# Patient Record
Sex: Male | Born: 1960 | Race: White | Hispanic: No | Marital: Married | Smoking: Never smoker
Health system: Southern US, Community
[De-identification: ages and names within clinical notes are randomized; demographics above are authoritative.]

## PROBLEM LIST (undated history)

## (undated) DIAGNOSIS — K219 Gastro-esophageal reflux disease without esophagitis: Secondary | ICD-10-CM

## (undated) DIAGNOSIS — I219 Acute myocardial infarction, unspecified: Secondary | ICD-10-CM

## (undated) DIAGNOSIS — E785 Hyperlipidemia, unspecified: Secondary | ICD-10-CM

## (undated) DIAGNOSIS — Z950 Presence of cardiac pacemaker: Secondary | ICD-10-CM

## (undated) DIAGNOSIS — Z87442 Personal history of urinary calculi: Secondary | ICD-10-CM

## (undated) DIAGNOSIS — D099 Carcinoma in situ, unspecified: Secondary | ICD-10-CM

## (undated) DIAGNOSIS — N183 Chronic kidney disease, stage 3 unspecified: Secondary | ICD-10-CM

## (undated) DIAGNOSIS — C4492 Squamous cell carcinoma of skin, unspecified: Secondary | ICD-10-CM

## (undated) DIAGNOSIS — Z95810 Presence of automatic (implantable) cardiac defibrillator: Secondary | ICD-10-CM

## (undated) DIAGNOSIS — I2510 Atherosclerotic heart disease of native coronary artery without angina pectoris: Secondary | ICD-10-CM

## (undated) DIAGNOSIS — C4491 Basal cell carcinoma of skin, unspecified: Secondary | ICD-10-CM

## (undated) DIAGNOSIS — C801 Malignant (primary) neoplasm, unspecified: Secondary | ICD-10-CM

## (undated) DIAGNOSIS — I5022 Chronic systolic (congestive) heart failure: Secondary | ICD-10-CM

## (undated) DIAGNOSIS — E119 Type 2 diabetes mellitus without complications: Secondary | ICD-10-CM

## (undated) HISTORY — PX: HERNIA REPAIR: SHX51

## (undated) HISTORY — DX: Hyperlipidemia, unspecified: E78.5

## (undated) HISTORY — DX: Chronic kidney disease, stage 3 unspecified: N18.30

## (undated) HISTORY — DX: Basal cell carcinoma of skin, unspecified: C44.91

## (undated) HISTORY — DX: Squamous cell carcinoma of skin, unspecified: C44.92

## (undated) HISTORY — PX: CARDIAC CATHETERIZATION: SHX172

## (undated) HISTORY — DX: Chronic kidney disease, stage 3 (moderate): N18.3

## (undated) HISTORY — DX: Carcinoma in situ, unspecified: D09.9

## (undated) HISTORY — DX: Type 2 diabetes mellitus without complications: E11.9

## (undated) HISTORY — DX: Presence of automatic (implantable) cardiac defibrillator: Z95.810

## (undated) HISTORY — DX: Atherosclerotic heart disease of native coronary artery without angina pectoris: I25.10

## (undated) HISTORY — DX: Gastro-esophageal reflux disease without esophagitis: K21.9

## (undated) HISTORY — DX: Chronic systolic (congestive) heart failure: I50.22

## (undated) HISTORY — PX: INSERT / REPLACE / REMOVE PACEMAKER: SUR710

---

## 2006-06-07 DIAGNOSIS — C4491 Basal cell carcinoma of skin, unspecified: Secondary | ICD-10-CM

## 2006-06-07 HISTORY — DX: Basal cell carcinoma of skin, unspecified: C44.91

## 2008-01-24 DIAGNOSIS — D099 Carcinoma in situ, unspecified: Secondary | ICD-10-CM

## 2008-01-24 HISTORY — DX: Carcinoma in situ, unspecified: D09.9

## 2009-05-03 ENCOUNTER — Ambulatory Visit: Payer: Self-pay | Admitting: Internal Medicine

## 2009-05-03 ENCOUNTER — Ambulatory Visit: Payer: Self-pay | Admitting: Pulmonary Disease

## 2009-05-03 ENCOUNTER — Inpatient Hospital Stay (HOSPITAL_COMMUNITY): Admission: EM | Admit: 2009-05-03 | Discharge: 2009-05-12 | Payer: Self-pay | Admitting: Cardiology

## 2009-05-03 DIAGNOSIS — I219 Acute myocardial infarction, unspecified: Secondary | ICD-10-CM

## 2009-05-03 HISTORY — DX: Acute myocardial infarction, unspecified: I21.9

## 2009-05-05 ENCOUNTER — Encounter: Payer: Self-pay | Admitting: Cardiology

## 2009-09-04 ENCOUNTER — Ambulatory Visit: Payer: Self-pay | Admitting: Cardiology

## 2009-12-04 ENCOUNTER — Ambulatory Visit: Payer: Self-pay | Admitting: Cardiology

## 2010-01-18 ENCOUNTER — Encounter: Payer: Self-pay | Admitting: Cardiology

## 2010-02-03 ENCOUNTER — Other Ambulatory Visit: Payer: Self-pay

## 2010-02-09 ENCOUNTER — Telehealth: Payer: Self-pay | Admitting: *Deleted

## 2010-02-09 NOTE — Telephone Encounter (Signed)
See chart for note dictated.

## 2010-03-23 LAB — COMPREHENSIVE METABOLIC PANEL
ALT: 147 U/L — ABNORMAL HIGH (ref 0–53)
ALT: 157 U/L — ABNORMAL HIGH (ref 0–53)
AST: 461 U/L — ABNORMAL HIGH (ref 0–37)
AST: 483 U/L — ABNORMAL HIGH (ref 0–37)
AST: 63 U/L — ABNORMAL HIGH (ref 0–37)
Albumin: 2.7 g/dL — ABNORMAL LOW (ref 3.5–5.2)
Albumin: 2.9 g/dL — ABNORMAL LOW (ref 3.5–5.2)
Albumin: 3.6 g/dL (ref 3.5–5.2)
Alkaline Phosphatase: 60 U/L (ref 39–117)
Alkaline Phosphatase: 83 U/L (ref 39–117)
BUN: 18 mg/dL (ref 6–23)
BUN: 20 mg/dL (ref 6–23)
CO2: 22 mEq/L (ref 19–32)
Calcium: 7.8 mg/dL — ABNORMAL LOW (ref 8.4–10.5)
Calcium: 8.2 mg/dL — ABNORMAL LOW (ref 8.4–10.5)
Creatinine, Ser: 1.09 mg/dL (ref 0.4–1.5)
GFR calc Af Amer: >60 mL/min (ref >60)
GFR calc Af Amer: >60 mL/min (ref >60)
GFR calc Af Amer: >60 mL/min (ref >60)
Potassium: 3.4 mEq/L — ABNORMAL LOW (ref 3.5–5.1)
Sodium: 136 mEq/L (ref 135–145)
Sodium: 138 mEq/L (ref 135–145)
Total Bilirubin: 0.9 mg/dL (ref 0.3–1.2)
Total Protein: 5.8 g/dL — ABNORMAL LOW (ref 6.0–8.3)
Total Protein: 6.8 g/dL (ref 6.0–8.3)

## 2010-03-23 LAB — HEPARIN LEVEL (UNFRACTIONATED)
Heparin Unfractionated: 0.15 IU/mL — ABNORMAL LOW (ref 0.30–0.70)
Heparin Unfractionated: 0.25 IU/mL — ABNORMAL LOW (ref 0.30–0.70)
Heparin Unfractionated: 0.31 IU/mL (ref 0.30–0.70)

## 2010-03-23 LAB — GLUCOSE, CAPILLARY: Glucose-Capillary: 121 mg/dL — ABNORMAL HIGH (ref 70–99)

## 2010-03-23 LAB — BRAIN NATRIURETIC PEPTIDE: Pro B Natriuretic peptide (BNP): 376 pg/mL — ABNORMAL HIGH (ref 0.0–100.0)

## 2010-03-23 LAB — DIFFERENTIAL
Basophils Absolute: 0 10*3/uL (ref 0.0–0.1)
Basophils Absolute: 0.1 10*3/uL (ref 0.0–0.1)
Basophils Absolute: 0.1 10*3/uL (ref 0.0–0.1)
Basophils Absolute: 0.1 10*3/uL (ref 0.0–0.1)
Basophils Relative: 0 % (ref 0–1)
Basophils Relative: 0 % (ref 0–1)
Basophils Relative: 0 % (ref 0–1)
Basophils Relative: 0 % (ref 0–1)
Basophils Relative: 1 % (ref 0–1)
Basophils Relative: 1 % (ref 0–1)
Basophils Relative: 1 % (ref 0–1)
Eosinophils Absolute: 0 10*3/uL (ref 0.0–0.7)
Eosinophils Absolute: 0.1 10*3/uL (ref 0.0–0.7)
Eosinophils Absolute: 0.2 10*3/uL (ref 0.0–0.7)
Eosinophils Absolute: 0.3 10*3/uL (ref 0.0–0.7)
Eosinophils Absolute: 0.4 10*3/uL (ref 0.0–0.7)
Eosinophils Relative: 0 % (ref 0–5)
Eosinophils Relative: 0 % (ref 0–5)
Eosinophils Relative: 1 % (ref 0–5)
Eosinophils Relative: 2 % (ref 0–5)
Lymphocytes Relative: 18 % (ref 12–46)
Lymphocytes Relative: 25 % (ref 12–46)
Lymphocytes Relative: 7 % — ABNORMAL LOW (ref 12–46)
Lymphs Abs: 1.7 10*3/uL (ref 0.7–4.0)
Lymphs Abs: 2.2 10*3/uL (ref 0.7–4.0)
Lymphs Abs: 2.4 10*3/uL (ref 0.7–4.0)
Monocytes Absolute: 0.7 10*3/uL (ref 0.1–1.0)
Monocytes Absolute: 0.8 10*3/uL (ref 0.1–1.0)
Monocytes Absolute: 0.9 10*3/uL (ref 0.1–1.0)
Monocytes Absolute: 1 10*3/uL (ref 0.1–1.0)
Monocytes Absolute: 1 10*3/uL (ref 0.1–1.0)
Monocytes Absolute: 1 10*3/uL (ref 0.1–1.0)
Monocytes Absolute: 1 10*3/uL (ref 0.1–1.0)
Monocytes Absolute: 1.3 10*3/uL — ABNORMAL HIGH (ref 0.1–1.0)
Monocytes Absolute: 1.4 10*3/uL — ABNORMAL HIGH (ref 0.1–1.0)
Monocytes Relative: 10 % (ref 3–12)
Monocytes Relative: 11 % (ref 3–12)
Monocytes Relative: 8 % (ref 3–12)
Monocytes Relative: 8 % (ref 3–12)
Monocytes Relative: 8 % (ref 3–12)
Monocytes Relative: 9 % (ref 3–12)
Neutro Abs: 12.5 10*3/uL — ABNORMAL HIGH (ref 1.7–7.7)
Neutro Abs: 13.6 10*3/uL — ABNORMAL HIGH (ref 1.7–7.7)
Neutro Abs: 5.1 10*3/uL (ref 1.7–7.7)
Neutro Abs: 6.6 10*3/uL (ref 1.7–7.7)
Neutro Abs: 9.2 10*3/uL — ABNORMAL HIGH (ref 1.7–7.7)
Neutrophils Relative %: 57 % (ref 43–77)
Neutrophils Relative %: 62 % (ref 43–77)
Neutrophils Relative %: 69 % (ref 43–77)
Neutrophils Relative %: 73 % (ref 43–77)
Neutrophils Relative %: 77 % (ref 43–77)
Neutrophils Relative %: 86 % — ABNORMAL HIGH (ref 43–77)

## 2010-03-23 LAB — POCT I-STAT 3, ART BLOOD GAS (G3+)
O2 Saturation: 90 %
O2 Saturation: 95 %
Patient temperature: 98.7
TCO2: 26 mmol/L (ref 0–100)
TCO2: 26 mmol/L (ref 0–100)
TCO2: 28 mmol/L (ref 0–100)
pCO2 arterial: 33.8 mmHg — ABNORMAL LOW (ref 35.0–45.0)
pCO2 arterial: 36.3 mmHg (ref 35.0–45.0)
pH, Arterial: 7.448 (ref 7.350–7.450)
pH, Arterial: 7.511 — ABNORMAL HIGH (ref 7.350–7.450)
pO2, Arterial: 53 mmHg — ABNORMAL LOW (ref 80.0–100.0)

## 2010-03-23 LAB — BASIC METABOLIC PANEL
BUN: 15 mg/dL (ref 6–23)
CO2: 23 mEq/L (ref 19–32)
CO2: 23 mEq/L (ref 19–32)
CO2: 25 mEq/L (ref 19–32)
CO2: 25 mEq/L (ref 19–32)
CO2: 26 mEq/L (ref 19–32)
CO2: 27 mEq/L (ref 19–32)
CO2: 27 mEq/L (ref 19–32)
CO2: 28 mEq/L (ref 19–32)
Calcium: 7.9 mg/dL — ABNORMAL LOW (ref 8.4–10.5)
Calcium: 8 mg/dL — ABNORMAL LOW (ref 8.4–10.5)
Calcium: 8.7 mg/dL (ref 8.4–10.5)
Chloride: 101 mEq/L (ref 96–112)
Chloride: 102 mEq/L (ref 96–112)
Chloride: 103 mEq/L (ref 96–112)
Chloride: 104 mEq/L (ref 96–112)
Chloride: 104 mEq/L (ref 96–112)
Creatinine, Ser: 1.08 mg/dL (ref 0.4–1.5)
Creatinine, Ser: 1.16 mg/dL (ref 0.4–1.5)
Creatinine, Ser: 1.16 mg/dL (ref 0.4–1.5)
Creatinine, Ser: 1.16 mg/dL (ref 0.4–1.5)
Creatinine, Ser: 1.2 mg/dL (ref 0.4–1.5)
Creatinine, Ser: 1.21 mg/dL (ref 0.4–1.5)
GFR calc Af Amer: 55 mL/min — ABNORMAL LOW (ref >60)
GFR calc Af Amer: >60 mL/min (ref >60)
GFR calc Af Amer: >60 mL/min (ref >60)
GFR calc Af Amer: >60 mL/min (ref >60)
GFR calc Af Amer: >60 mL/min (ref >60)
Glucose, Bld: 110 mg/dL — ABNORMAL HIGH (ref 70–99)
Glucose, Bld: 115 mg/dL — ABNORMAL HIGH (ref 70–99)
Glucose, Bld: 132 mg/dL — ABNORMAL HIGH (ref 70–99)
Glucose, Bld: 142 mg/dL — ABNORMAL HIGH (ref 70–99)
Glucose, Bld: 163 mg/dL — ABNORMAL HIGH (ref 70–99)
Potassium: 3.9 mEq/L (ref 3.5–5.1)
Sodium: 133 mEq/L — ABNORMAL LOW (ref 135–145)
Sodium: 135 mEq/L (ref 135–145)
Sodium: 138 mEq/L (ref 135–145)
Sodium: 138 mEq/L (ref 135–145)
Sodium: 138 mEq/L (ref 135–145)

## 2010-03-23 LAB — TROPONIN I
Troponin I: 1.54 ng/mL (ref 0.00–0.06)
Troponin I: >100 ng/mL (ref 0.00–0.06)
Troponin I: >100 ng/mL (ref 0.00–0.06)

## 2010-03-23 LAB — CBC
HCT: 40.2 % (ref 39.0–52.0)
HCT: 41.2 % (ref 39.0–52.0)
HCT: 43.8 % (ref 39.0–52.0)
HCT: 51.5 % (ref 39.0–52.0)
Hemoglobin: 13.3 g/dL (ref 13.0–17.0)
Hemoglobin: 13.8 g/dL (ref 13.0–17.0)
Hemoglobin: 13.8 g/dL (ref 13.0–17.0)
Hemoglobin: 14 g/dL (ref 13.0–17.0)
Hemoglobin: 14 g/dL (ref 13.0–17.0)
Hemoglobin: 15.4 g/dL (ref 13.0–17.0)
MCHC: 34.3 g/dL (ref 30.0–36.0)
MCHC: 34.3 g/dL (ref 30.0–36.0)
MCHC: 34.7 g/dL (ref 30.0–36.0)
MCHC: 35 g/dL (ref 30.0–36.0)
MCHC: 35 g/dL (ref 30.0–36.0)
MCHC: 35.1 g/dL (ref 30.0–36.0)
MCHC: 35.1 g/dL (ref 30.0–36.0)
MCV: 90.2 fL (ref 78.0–100.0)
MCV: 90.7 fL (ref 78.0–100.0)
MCV: 90.9 fL (ref 78.0–100.0)
MCV: 91 fL (ref 78.0–100.0)
MCV: 91.8 fL (ref 78.0–100.0)
Platelets: 156 10*3/uL (ref 150–400)
Platelets: 160 10*3/uL (ref 150–400)
Platelets: 235 10*3/uL (ref 150–400)
Platelets: 242 10*3/uL (ref 150–400)
RBC: 4.18 MIL/uL — ABNORMAL LOW (ref 4.22–5.81)
RBC: 4.38 MIL/uL (ref 4.22–5.81)
RBC: 4.41 MIL/uL (ref 4.22–5.81)
RBC: 4.44 MIL/uL (ref 4.22–5.81)
RDW: 12.8 % (ref 11.5–15.5)
RDW: 12.8 % (ref 11.5–15.5)
RDW: 13.2 % (ref 11.5–15.5)
RDW: 13.3 % (ref 11.5–15.5)
RDW: 13.4 % (ref 11.5–15.5)
RDW: 13.7 % (ref 11.5–15.5)
WBC: 9 10*3/uL (ref 4.0–10.5)
WBC: 9.4 10*3/uL (ref 4.0–10.5)
WBC: 9.7 10*3/uL (ref 4.0–10.5)

## 2010-03-23 LAB — CK TOTAL AND CKMB (NOT AT ARMC)
CK, MB: 306.7 ng/mL (ref 0.3–4.0)
Relative Index: 6.6 — ABNORMAL HIGH (ref 0.0–2.5)
Relative Index: 9.1 — ABNORMAL HIGH (ref 0.0–2.5)
Total CK: 4637 U/L — ABNORMAL HIGH (ref 7–232)
Total CK: 6388 U/L — ABNORMAL HIGH (ref 7–232)

## 2010-03-23 LAB — HEPATIC FUNCTION PANEL
AST: 89 U/L — ABNORMAL HIGH (ref 0–37)
Bilirubin, Direct: 0.3 mg/dL (ref 0.0–0.3)

## 2010-03-23 LAB — MRSA PCR SCREENING: MRSA by PCR: NEGATIVE

## 2010-03-23 LAB — LIPID PANEL: VLDL: 46 mg/dL — ABNORMAL HIGH (ref 0–40)

## 2010-03-23 LAB — HEMOGLOBIN A1C
Mean Plasma Glucose: 126 mg/dL — ABNORMAL HIGH (ref <117)
Mean Plasma Glucose: 128 mg/dL — ABNORMAL HIGH (ref <117)

## 2010-03-23 LAB — MAGNESIUM: Magnesium: 2.3 mg/dL (ref 1.5–2.5)

## 2010-03-28 ENCOUNTER — Encounter: Payer: Self-pay | Admitting: Cardiology

## 2010-03-29 ENCOUNTER — Ambulatory Visit: Payer: BC Managed Care – PPO | Admitting: Cardiology

## 2010-03-29 ENCOUNTER — Inpatient Hospital Stay (HOSPITAL_COMMUNITY)
Admission: AD | Admit: 2010-03-29 | Discharge: 2010-04-02 | DRG: 127 | Disposition: A | Discharge status: Home / Self Care | Payer: BC Managed Care – PPO | Source: Other Acute Inpatient Hospital | Attending: Cardiology | Admitting: Cardiology

## 2010-03-29 DIAGNOSIS — I5023 Acute on chronic systolic (congestive) heart failure: Principal | ICD-10-CM | POA: Diagnosis present

## 2010-03-29 DIAGNOSIS — I959 Hypotension, unspecified: Secondary | ICD-10-CM | POA: Diagnosis present

## 2010-03-29 DIAGNOSIS — K811 Chronic cholecystitis: Secondary | ICD-10-CM | POA: Diagnosis present

## 2010-03-29 DIAGNOSIS — I252 Old myocardial infarction: Secondary | ICD-10-CM

## 2010-03-29 DIAGNOSIS — N183 Chronic kidney disease, stage 3 unspecified: Secondary | ICD-10-CM | POA: Diagnosis present

## 2010-03-29 DIAGNOSIS — I2589 Other forms of chronic ischemic heart disease: Secondary | ICD-10-CM | POA: Diagnosis present

## 2010-03-29 DIAGNOSIS — I2510 Atherosclerotic heart disease of native coronary artery without angina pectoris: Secondary | ICD-10-CM

## 2010-03-29 DIAGNOSIS — I509 Heart failure, unspecified: Secondary | ICD-10-CM | POA: Diagnosis present

## 2010-03-29 DIAGNOSIS — E785 Hyperlipidemia, unspecified: Secondary | ICD-10-CM | POA: Diagnosis present

## 2010-03-29 DIAGNOSIS — K219 Gastro-esophageal reflux disease without esophagitis: Secondary | ICD-10-CM | POA: Diagnosis present

## 2010-03-29 DIAGNOSIS — E119 Type 2 diabetes mellitus without complications: Secondary | ICD-10-CM | POA: Diagnosis present

## 2010-03-29 LAB — CARDIAC PANEL(CRET KIN+CKTOT+MB+TROPI)
CK, MB: 1.6 ng/mL (ref 0.3–4.0)
Relative Index: 0.6 (ref 0.0–2.5)
Total CK: 263 U/L — ABNORMAL HIGH (ref 7–232)
Troponin I: 0.03 ng/mL (ref 0.00–0.06)

## 2010-03-30 DIAGNOSIS — I509 Heart failure, unspecified: Secondary | ICD-10-CM

## 2010-03-30 LAB — COMPREHENSIVE METABOLIC PANEL
ALT: 25 U/L (ref 0–53)
AST: 34 U/L (ref 0–37)
Alkaline Phosphatase: 111 U/L (ref 39–117)
CO2: 27 mEq/L (ref 19–32)
GFR calc Af Amer: 58 mL/min — ABNORMAL LOW (ref >60)
GFR calc non Af Amer: 48 mL/min — ABNORMAL LOW (ref >60)
Glucose, Bld: 174 mg/dL — ABNORMAL HIGH (ref 70–99)
Potassium: 3.7 mEq/L (ref 3.5–5.1)
Sodium: 139 mEq/L (ref 135–145)
Total Protein: 6.4 g/dL (ref 6.0–8.3)

## 2010-03-30 LAB — LIPID PANEL
Cholesterol: 96 mg/dL (ref 0–200)
Total CHOL/HDL Ratio: 3.2 RATIO

## 2010-03-30 LAB — BRAIN NATRIURETIC PEPTIDE: Pro B Natriuretic peptide (BNP): 807 pg/mL — ABNORMAL HIGH (ref 0.0–100.0)

## 2010-03-30 LAB — PROTIME-INR
INR: 1.34 (ref 0.00–1.49)
Prothrombin Time: 16.8 seconds — ABNORMAL HIGH (ref 11.6–15.2)

## 2010-03-30 LAB — CBC
MCHC: 33.7 g/dL (ref 30.0–36.0)
RDW: 16.3 % — ABNORMAL HIGH (ref 11.5–15.5)

## 2010-03-30 LAB — HEMOCCULT GUIAC POC 1CARD (OFFICE): Fecal Occult Bld: NEGATIVE

## 2010-03-31 ENCOUNTER — Inpatient Hospital Stay (HOSPITAL_COMMUNITY): Payer: BC Managed Care – PPO

## 2010-03-31 LAB — BASIC METABOLIC PANEL
CO2: 32 mEq/L (ref 19–32)
Calcium: 9.1 mg/dL (ref 8.4–10.5)
Creatinine, Ser: 1.56 mg/dL — ABNORMAL HIGH (ref 0.4–1.5)
GFR calc Af Amer: 58 mL/min — ABNORMAL LOW (ref >60)
Sodium: 137 mEq/L (ref 135–145)

## 2010-03-31 LAB — POCT I-STAT 3, VENOUS BLOOD GAS (G3P V)
Bicarbonate: 28.6 mEq/L — ABNORMAL HIGH (ref 20.0–24.0)
O2 Saturation: 65 %
TCO2: 30 mmol/L (ref 0–100)
pCO2, Ven: 42.7 mmHg — ABNORMAL LOW (ref 45.0–50.0)
pCO2, Ven: 44.1 mmHg — ABNORMAL LOW (ref 45.0–50.0)
pH, Ven: 7.406 — ABNORMAL HIGH (ref 7.250–7.300)
pH, Ven: 7.421 — ABNORMAL HIGH (ref 7.250–7.300)
pO2, Ven: 32 mmHg (ref 30.0–45.0)
pO2, Ven: 33 mmHg (ref 30.0–45.0)

## 2010-04-01 LAB — HEPATIC FUNCTION PANEL
AST: 30 U/L (ref 0–37)
Bilirubin, Direct: 0.7 mg/dL — ABNORMAL HIGH (ref 0.0–0.3)
Indirect Bilirubin: 1.4 mg/dL — ABNORMAL HIGH (ref 0.3–0.9)
Total Bilirubin: 2.1 mg/dL — ABNORMAL HIGH (ref 0.3–1.2)

## 2010-04-01 LAB — BASIC METABOLIC PANEL
BUN: 14 mg/dL (ref 6–23)
CO2: 30 mEq/L (ref 19–32)
Calcium: 9.3 mg/dL (ref 8.4–10.5)
Creatinine, Ser: 1.51 mg/dL — ABNORMAL HIGH (ref 0.4–1.5)
Glucose, Bld: 125 mg/dL — ABNORMAL HIGH (ref 70–99)
Sodium: 140 mEq/L (ref 135–145)

## 2010-04-01 LAB — BRAIN NATRIURETIC PEPTIDE: Pro B Natriuretic peptide (BNP): 348 pg/mL — ABNORMAL HIGH (ref 0.0–100.0)

## 2010-04-02 DIAGNOSIS — I5023 Acute on chronic systolic (congestive) heart failure: Secondary | ICD-10-CM

## 2010-04-02 LAB — BASIC METABOLIC PANEL
BUN: 11 mg/dL (ref 6–23)
Calcium: 9.5 mg/dL (ref 8.4–10.5)
GFR calc non Af Amer: >60 mL/min (ref >60)
Glucose, Bld: 101 mg/dL — ABNORMAL HIGH (ref 70–99)
Sodium: 137 mEq/L (ref 135–145)

## 2010-04-05 NOTE — Procedures (Signed)
  NAMEBUTLER, VEGH              ACCOUNT NO.:  [id number]  MEDICAL RECORD NO.:  [id number]           PATIENT TYPE:  I  LOCATION:  2001                         FACILITY:  [facility]  PHYSICIAN:  Baby Stairs. Excell Seltzer, MD  DATE OF BIRTH:  [birth year]  DATE OF PROCEDURE:  03/30/2010 DATE OF DISCHARGE:                           CARDIAC CATHETERIZATION   PROCEDURE:  Right heart catheterization.  PROCEDURAL INDICATION:  Mr. Viets is a 50 year old gentleman with a history of MI.  He has an ejection fraction of about 40% at baseline, but on echo from [facility] yesterday, his ejection fraction was down to 10%.  There were concerns about low output heart failure as he had some abdominal discomfort early this morning.  He was referred for right heart cath for hemodynamic assessment.  Risks and indications of procedure were reviewed with the patient. Informed consent was obtained.  The right groin was prepped, draped, and anesthetized with 1% lidocaine.  Using modified Seldinger technique, a 7- French sheath was placed in the right femoral vein and a Swan-Ganz catheter was advanced into the pulmonary artery.  Right heart pressures were recorded throughout all of the right heart chambers.  Fick and thermodilution cardiac outputs were both calculated.  The patient tolerated the procedure well.  There were no immediate complications.  PROCEDURAL FINDINGS:  Right atrial pressure mean of 4, right ventricular pressure 33/3, pulmonary artery pressure 40/17 with a mean of 25, pulmonary capillary wedge pressure mean of 12.  Oxygen saturations:  Aorta is 92% by pulse oximetry, pulmonary artery is 65%.  Fick cardiac output calculated to 4.8 liters per minute.  Cardiac index was 2.4 liters per minute per meter squared.  Thermodilution cardiac output was 4.5 liters per minute.  Cardiac index was 2.2 liters per minute per meter squared.  FINAL IMPRESSION:  Essentially normal hemodynamics with  preserved cardiac output.     Veverly Fells. Excell Seltzer, MD     MDC/MEDQ  D:  03/30/2010  T:  03/31/2010  Job:  [id number]  cc:   Bevelyn Buckles. Bensimhon, MD Learta Codding, MD,FACC  Electronically Signed by Tonny Bollman MD on 04/05/2010 01:14:41 PM

## 2010-04-06 NOTE — Consult Note (Signed)
Summary: CARDIOLOGY CONSULT/ [facility]  CARDIOLOGY CONSULT/ [facility]   Imported By: Zachary George 03/30/2010 09:00:06  _____________________________________________________________________  External Attachment:    Type:   Image     Comment:   External Document

## 2010-04-07 ENCOUNTER — Other Ambulatory Visit: Payer: BC Managed Care – PPO | Admitting: *Deleted

## 2010-04-09 ENCOUNTER — Encounter: Payer: Self-pay | Admitting: Internal Medicine

## 2010-04-12 ENCOUNTER — Encounter: Payer: Self-pay | Admitting: Nurse Practitioner

## 2010-04-13 ENCOUNTER — Encounter: Payer: Self-pay | Admitting: Nurse Practitioner

## 2010-04-15 ENCOUNTER — Ambulatory Visit (INDEPENDENT_AMBULATORY_CARE_PROVIDER_SITE_OTHER): Payer: BC Managed Care – PPO | Admitting: Internal Medicine

## 2010-04-15 ENCOUNTER — Encounter: Payer: Self-pay | Admitting: Internal Medicine

## 2010-04-15 VITALS — BP 115/82 | HR 86 | Resp 14 | Ht 72.0 in | Wt 177.0 lb

## 2010-04-15 DIAGNOSIS — I2510 Atherosclerotic heart disease of native coronary artery without angina pectoris: Secondary | ICD-10-CM

## 2010-04-15 DIAGNOSIS — I5022 Chronic systolic (congestive) heart failure: Secondary | ICD-10-CM | POA: Insufficient documentation

## 2010-04-15 MED ORDER — CANDESARTAN CILEXETIL 4 MG PO TABS: 4.0000 mg | ORAL_TABLET | Freq: Two times a day (BID) | ORAL | Status: DC

## 2010-04-15 MED ORDER — HYDRALAZINE HCL 25 MG PO TABS: ORAL_TABLET | ORAL | Status: DC

## 2010-04-15 NOTE — Progress Notes (Signed)
HPI:  Jerl is a 50 y/o [employer] with h/o CAD s/p anterior MI in May 2011 treated with DES. Hospitalization c/b ARDS. EF on d/c 40-45%. Has h/o angioedema on ACE-I.   Admitted in 3/12 with acute/chronic CHF. EF down to 10-15%. 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 Diuresed and meds titrated.  Right heart cardiac catheterization performed on March 30, 2010, showing essentially normal hemodynamics and preserved cardiac output and RA of 4, RV 33/1 (3), PA 40/17 (25), PCWP 12.  Cardiac output (Fick) 4.83 liters per minute with an index of 2.42 liters per minute per meter squared. D/c'd home with Life Vest.  Returns for f/u. Feels better than he has in a year. Very active. No CP or dyspnea. Weighing every and is stable. Wife is a Teacher, early years/pre and following sliding scale lasix.   Unable to tolerate Imdur due to headaches. Had to cut hydralazine back to 25 bid due to orthostasis. LifeVest gave him a shock warning but downloaded data and was due to static electricity from taking shirt off.    ROS: All systems negative except as listed in HPI, PMH and Problem List.  Past Medical History  Diagnosis Date  . HLD (hyperlipidemia)   . GERD (gastroesophageal reflux disease)   . Cardiomyopathy   . CAD (coronary artery disease)   . Chronic kidney disease, stage III (moderate)     Current Outpatient Prescriptions  Medication Sig Dispense Refill  . aspirin 81 MG tablet Take 81 mg by mouth daily.        . candesartan (ATACAND) 4 MG tablet Take 4 mg by mouth daily.        . carvedilol (COREG) 3.125 MG tablet Take 3.125 mg by mouth 2 (two) times daily with a meal.        . clopidogrel (PLAVIX) 75 MG tablet Take 75 mg by mouth daily.        . fexofenadine (ALLEGRA) 180 MG tablet Take 180 mg by mouth as needed.       . furosemide (LASIX) 20 MG tablet Take 20 mg by mouth daily. 1 tab daily.......Marland Kitchenevery third day pt takes 40mg  due to swelling      . hydrALAZINE (APRESOLINE) 25 MG tablet Take  25 mg by mouth 2 (two) times daily.       . ondansetron (ZOFRAN) 8 MG tablet Take 8 mg by mouth every 8 (eight) hours as needed.        . rosuvastatin (CRESTOR) 40 MG tablet Take 40 mg by mouth daily.        Marland Kitchen spironolactone (ALDACTONE) 25 MG tablet Take 12.5 mg by mouth daily.        Marland Kitchen DISCONTD: isosorbide mononitrate (IMDUR) 30 MG 24 hr tablet Take 30 mg by mouth daily.           PHYSICAL EXAM: Filed Vitals:   04/15/10 1529  BP: 115/82  Pulse: 86  Resp: 14   General:  Well appearing. No resp difficulty HEENT: normal Neck: supple. JVP flat. Carotids 2+ bilaterally; no bruits. No lymphadenopathy or thryomegaly appreciated. Cor: PMI normal. Regular rate & rhythm. No rubs, gallops or murmurs. Lungs: clear Abdomen: soft, nontender, nondistended. No hepatosplenomegaly. No bruits or masses. Good bowel sounds. Extremities: no cyanosis, clubbing, rash, edema Neuro: alert & orientedx3, cranial nerves grossly intact. Moves all 4 extremities w/o difficulty. Affect pleasant.    ECG:   ASSESSMENT & PLAN:

## 2010-04-15 NOTE — Patient Instructions (Addendum)
Decrease Hydralazine to 25mg  1/2 tab Twice daily  Increase Candesartan to 4mg  Twice daily  Follow up in 2 weeks

## 2010-04-15 NOTE — Assessment & Plan Note (Signed)
Much improved. NYHA II. Volume status and labs look good. Increase candesartan to 4 bid. Cut hydralazine to 12.5 bid. See back in 2 weeks. Plan to titrate coreg at next visit. Will probably do quick look echo at next visit. Continue LifeVest for now.

## 2010-04-15 NOTE — Assessment & Plan Note (Signed)
No evidence of ischemia. Continue current regimen.   

## 2010-04-29 ENCOUNTER — Ambulatory Visit (INDEPENDENT_AMBULATORY_CARE_PROVIDER_SITE_OTHER): Payer: BC Managed Care – PPO | Admitting: Internal Medicine

## 2010-04-29 ENCOUNTER — Encounter: Payer: Self-pay | Admitting: Internal Medicine

## 2010-04-29 VITALS — BP 117/78 | HR 65 | Ht 72.0 in | Wt 183.0 lb

## 2010-04-29 DIAGNOSIS — I5022 Chronic systolic (congestive) heart failure: Secondary | ICD-10-CM

## 2010-04-29 MED ORDER — CARVEDILOL 6.25 MG PO TABS: 6.2500 mg | ORAL_TABLET | Freq: Two times a day (BID) | ORAL | Status: DC

## 2010-04-29 NOTE — Patient Instructions (Addendum)
Increase Carvedilol to 6.25mg  Twice daily  Stop Hydralazine Your physician recommends that you schedule a follow-up appointment in: 1 month

## 2010-04-29 NOTE — Assessment & Plan Note (Signed)
Doing very well. NYHA I. Volume status looks good. However, I checked echo myself today and EF remains ~15% range. Will titrate carvedilol to 6.25 bid. Given low BPs will stop hydralazine. Continue LifeVest.

## 2010-04-29 NOTE — Progress Notes (Signed)
HPI:  Derrick Lane is a 50 y/o state [employer] with h/o CAD s/p anterior MI in May 2011 treated with DES. Hospitalization c/b ARDS. EF on d/c 40-45%. Has h/o angioedema on ACE-I.   Admitted in 3/12 with acute/chronic CHF. EF down to 10-15%. 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 Diuresed and meds titrated.  Right heart cardiac catheterization performed on March 30, 2010, showing essentially normal hemodynamics and preserved cardiac output and RA of 4, RV 33/1 (3), PA 40/17 (25), PCWP 12.  Cardiac output (Fick) 4.83 liters per minute with an index of 2.42 liters per minute per meter squared. D/c'd home with Life Vest.  At last visit cut hydralazine back and increased candesartan to 4 bid.  Returns for f/u. Doing great. Very active. No CP or dyspnea. Appetite back and eating a lot. Weighing every day and has been gaining slowly. No edema, orthopnea or PND. Occasionally dizzy when he stands. SBP 95-105. Had one reading of 87.    ROS: All systems negative except as listed in HPI, PMH and Problem List.  Past Medical History  Diagnosis Date  . HLD (hyperlipidemia)   . GERD (gastroesophageal reflux disease)   . Chronic systolic heart failure     EF 16-10%.   Marland Kitchen CAD (coronary artery disease)     s/p Anterior MI 5/11. LAD stent  . Chronic kidney disease, stage III (moderate)     Current Outpatient Prescriptions  Medication Sig Dispense Refill  . aspirin 81 MG tablet Take 81 mg by mouth daily.        . candesartan (ATACAND) 4 MG tablet Take 1 tablet (4 mg total) by mouth 2 (two) times daily.  60 tablet  6  . carvedilol (COREG) 3.125 MG tablet Take 3.125 mg by mouth 2 (two) times daily with a meal.        . clopidogrel (PLAVIX) 75 MG tablet Take 75 mg by mouth daily.        . fexofenadine (ALLEGRA) 180 MG tablet Take 180 mg by mouth as needed.       . furosemide (LASIX) 20 MG tablet Take 20 mg by mouth daily. 1 tab daily.......Marland Kitchenevery third day pt takes 40mg  due to swelling      .  hydrALAZINE (APRESOLINE) 25 MG tablet 1/2 tab Twice daily  30 tablet  6  . ondansetron (ZOFRAN) 8 MG tablet Take 8 mg by mouth every 8 (eight) hours as needed.        . rosuvastatin (CRESTOR) 40 MG tablet Take 40 mg by mouth daily.        Marland Kitchen spironolactone (ALDACTONE) 25 MG tablet Take 12.5 mg by mouth daily.           PHYSICAL EXAM: Filed Vitals:   04/29/10 1554  BP: 117/78  Pulse: 80   General:  Well appearing. No resp difficulty HEENT: normal Neck: supple. JVP flat. Carotids 2+ bilaterally; no bruits. No lymphadenopathy or thryomegaly appreciated. Cor: PMI normal. Regular rate & rhythm. No rubs, gallops or murmurs. Lungs: clear Abdomen: soft, nontender, nondistended. No hepatosplenomegaly. No bruits or masses. Good bowel sounds. Extremities: no cyanosis, clubbing, rash, edema Neuro: alert & orientedx3, cranial nerves grossly intact. Moves all 4 extremities w/o difficulty. Affect pleasant.    ECG: NSR 65 septal infarct. No ST-T wave abnormalities.     ASSESSMENT & PLAN:

## 2010-05-03 ENCOUNTER — Telehealth: Payer: Self-pay | Admitting: *Deleted

## 2010-05-03 NOTE — Telephone Encounter (Signed)
Completed prior auth paperwork for pt's Atacand last week, he was intolerant of Diovan in the hospital so was placed on Atacand, med was approved 04/30/10

## 2010-05-04 NOTE — Discharge Summary (Signed)
Derrick Lane, Derrick Lane              ACCOUNT NO.:  [id number]  MEDICAL RECORD NO.:  [id number]           PATIENT TYPE:  I  LOCATION:  2001                         FACILITY:  [facility]  PHYSICIAN:  Bevelyn Buckles. Aleia Larocca, MDDATE OF BIRTH:  10-31-1960  DATE OF ADMISSION:  03/29/2010 DATE OF DISCHARGE:  04/02/2010                              DISCHARGE SUMMARY   PRIMARY CARDIOLOGIST:  Bevelyn Buckles. Kiptyn Rafuse, MD  DISCHARGE DIAGNOSIS:  Acute-on-chronic systolic congestive heart failure.  SECONDARY DIAGNOSES: 1. Mixed nonischemic and ischemic cardiomyopathy, ejection fraction of     10%. 2. Coronary artery disease, status post anterior myocardial infarction     with stenting of the left anterior descending x2 in May 2011. 3. Hyperlipidemia. 4. Gastroesophageal reflux disease. 5. Borderline diabetes mellitus. 6. Stage III chronic kidney disease. 7. Intermittent abdominal discomfort with gallbladder wall thickening     and polyps without stones noted on ultrasound this admission. 8. 2.2-cm simple cyst noted on the tail of the pancreas and spleen.  ALLERGIES:  RAMIPRIL caused angioedema.  PROCEDURES: 1. Abdominal ultrasound on March 31, 2010, showing abnormal     gallbladder with wall thickening and polyps.  No definite stones.     There was a 2.2-cm simple cyst noted in the region of the tail of     the pancreas and spleen.  No other acute or significant findings. 2. Right heart cardiac catheterization performed on May 30, 2010,     showing essentially normal hemodynamics and preserved cardiac     output and RA of 4, RV 33/1 (3), PA 40/17 (25), PCWP 12.  Cardiac     output (Fick) 4.83 liters per minute with an index of 2.42 liters     per minute per meter squared.  HISTORY OF PRESENT ILLNESS:  This is a 50 year old male with prior history of coronary artery disease status post anterior MI in May 2011 with LAD stenting x2 at that time.  This hospital course was complicated by cardiogenic  shock and pulmonary edema requiring intraaortic balloon pump.  Two-D echocardiogram in May 2011 showed an EF of 40-45%.  Since the MI, the patient had some degree of persistent nausea with occasional vomiting which has been progressively worsening since January 2012 and now associated with early satiety and weight loss.  He was seen by his primary care provider on March 18, 2010, with gallbladder ultrasound showing thickening of the wall with sludge in the gallbladder and has been scheduled to see Surgery to discuss cholecystectomy.  In the meantime, however, he developed significant anorexia with weakness and fatigue and presented to [facility] on March [facility], 2010.  There, he had a CT scan of abdomen showing right greater than left pleural effusions and ascites of undetermined etiology as well as right lower lobe pulmonary opacity which was felt to represent atelectasis versus pneumonia.  The patient was also felt to be significant volume overloaded and Cardiology was consulted.  A repeat 2-D echocardiogram was undertaken in the setting of fatigue, ascites, and anorexia and this showed an EF of less than 20% with moderate to severe LV dilatation and restrictive diastolic filling.  There was scarring and thinning of the basal anterior, mid anteroseptal, mid anterior, apical anterior, and apical lateral walls.  This was a significant reduction in LV function since his last echo following his MI.  The patient was transferred to the [facility] for further evaluation.  HOSPITAL COURSE:  Following transfer, the patient still had some volume overload.  He was maintained on IV Lasix for an additional day.  His beta-blocker was converted to carvedilol.  We could not use ACE inhibitor secondary to prior history of angioedema.  Right heart cardiac catheterization was undertaken on March 30, 2010, showing relatively normal filling pressures with cardiac output of 4.8 and cardiac index of 2.4  as outlined above.  The patient's Lasix was converted to p.o. and he was also placed on low-dose spirolactone therapy.  He has been challenged with Atacand (an ARB) and has tolerated this well without any evidence of angioedema.  As outlined in the HPI, the patient has been dealing with nausea and abdominal discomfort as well as early satiety.  Gallbladder ultrasound was repeated here on March 31, 2010, showing wall thickening and polyps. As previously mentioned, the patient has outpatient arrangements to discuss these findings with Surgery.  General Surgery was curbside consulted here and with recommendation for outpatient followup if abdominal discomfort returns or worsens.  Finally, in the setting of significant cardiomyopathy, we have arranged for a LifeVest placement which was carried out on April 01, 2010.  The patient will continue to wear LifeVest and we will plan for followup echocardiogram on maximal medical therapy in approximately 8-12 weeks with plan for ICD if EF remains poor.  The patient will otherwise be discharged home today in good condition.  DISCHARGE LABORATORY DATA:  Weight is 75 kg, down from 79.039 kg on admission.  The patient had a negative of approximately 6.7 liters during this admission.  Hemoglobin 15.0, hematocrit 44.5, WBC 9.6, and platelets 161.  INR 1.34.  Sodium 137, potassium 4.0, chloride 101, CO2 of 25, BUN 11, creatinine 0.12, and glucose 101.  Total bilirubin 2.1, direct bilirubin 0.7, indirect bilirubin 1.4, alkaline phosphatase 121, AST 30, ALT 24, total protein 6.5, albumin 3.4, and calcium 9.5.  CK 249, MB 1.3, and troponin-I 0.02.  BNP was 348.  Total cholesterol 96, triglycerides 79, HDL 30, and LDL 50.  Fecal occult blood was negative.  DISPOSITION:  The patient will be discharged home today in good condition.  FOLLOWUP PLANS AND APPOINTMENTS:  The patient will have followup basic metabolic panel and BNP on April 09, 2010, in our  [place] office.  He will follow up with Dr. Arvilla Meres on April 15, 2010, at 2:45 p.m.  DISCHARGE MEDICATIONS: 1. Candesartan 4 mg daily. 2. Carvedilol 3.125 mg b.i.d. 3. Hydralazine 25 mg t.i.d. 4. Imdur 30 mg nightly. 5. Spirolactone 25 mg 1/2 a tablet daily. 6. Allegra 180 mg daily. 7. Aspirin 81 mg daily. 8. Crestor 40 mg daily. 9. Furosemide 20 mg daily. 10.Plavix 75 mg daily. 11.Zofran 8 mg q.6 h. P.r.n.  OUTSTANDING LABORATORY DATA AND STUDIES:  Followup BMET and BNP in 1 week.  DURATION OF DISCHARGE ENCOUNTER:  60 minutes including physician time.     Nicolasa Ducking, ANP   ______________________________ Bevelyn Buckles. Kema Santaella, MD    CB/MEDQ  D:  04/02/2010  T:  04/03/2010  Job:  [id number]  Electronically Signed by Nicolasa Ducking ANP on 04/13/2010 27:25:36 PM Electronically Signed by Arvilla Meres MD on 05/04/2010 07:10:25 PM

## 2010-05-12 ENCOUNTER — Other Ambulatory Visit: Payer: Self-pay | Admitting: *Deleted

## 2010-05-12 MED ORDER — ROSUVASTATIN CALCIUM 40 MG PO TABS: 40.0000 mg | ORAL_TABLET | Freq: Every day | ORAL | Status: DC

## 2010-06-03 ENCOUNTER — Encounter: Payer: Self-pay | Admitting: *Deleted

## 2010-06-03 ENCOUNTER — Ambulatory Visit (INDEPENDENT_AMBULATORY_CARE_PROVIDER_SITE_OTHER): Payer: BC Managed Care – PPO | Admitting: Internal Medicine

## 2010-06-03 ENCOUNTER — Encounter: Payer: Self-pay | Admitting: Internal Medicine

## 2010-06-03 VITALS — BP 118/80 | HR 57 | Ht 72.0 in | Wt 193.0 lb

## 2010-06-03 DIAGNOSIS — I2510 Atherosclerotic heart disease of native coronary artery without angina pectoris: Secondary | ICD-10-CM

## 2010-06-03 DIAGNOSIS — I5022 Chronic systolic (congestive) heart failure: Secondary | ICD-10-CM

## 2010-06-03 LAB — BASIC METABOLIC PANEL
Calcium: 9.2 mg/dL (ref 8.4–10.5)
Chloride: 103 mEq/L (ref 96–112)
Creatinine, Ser: 1.1 mg/dL (ref 0.4–1.5)
Sodium: 140 mEq/L (ref 135–145)

## 2010-06-03 LAB — BRAIN NATRIURETIC PEPTIDE: Pro B Natriuretic peptide (BNP): 221 pg/mL — ABNORMAL HIGH (ref 0.0–100.0)

## 2010-06-03 MED ORDER — CARVEDILOL 6.25 MG PO TABS: 9.3750 mg | ORAL_TABLET | Freq: Two times a day (BID) | ORAL | Status: DC

## 2010-06-03 NOTE — Assessment & Plan Note (Signed)
NYHA I. Volume status looks good. BP coming up is a good sign. Will increase coreg to 9.375 bid. Due for labs today.

## 2010-06-03 NOTE — Assessment & Plan Note (Signed)
No evidence of ischemia. Continue current regimen.   

## 2010-06-03 NOTE — Patient Instructions (Signed)
Increase Carvedilol to 1 & 1/2 tabs Twice daily  Labs today (bmet, bnp) Your physician has requested that you have an echocardiogram. Echocardiography is a painless test that uses sound waves to create images of your heart. It provides your doctor with information about the size and shape of your heart and how well your heart's chambers and valves are working. This procedure takes approximately one hour. There are no restrictions for this procedure.  IN 3 WEEKS, SAME DAY AS APPT. WITH MD Your physician recommends that you schedule a follow-up appointment in: 3 weeks

## 2010-06-03 NOTE — Progress Notes (Signed)
HPI:  Derrick Lane is a 50 y/o [employer] with h/o CAD s/p anterior MI in May 2011 treated with DES. Hospitalization c/b ARDS. EF on d/c 40-45%. Has h/o angioedema on ACE-I.   Admitted in 3/12 with acute/chronic CHF. EF down to 10-15%. 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 Diuresed and meds titrated.  Right heart cardiac catheterization performed on March 30, 2010, showing essentially normal hemodynamics and preserved cardiac output and RA of 4, RV 33/1 (3), PA 40/17 (25), PCWP 12.  Cardiac output (Fick) 4.83 liters per minute with an index of 2.42 liters per minute per meter squared. D/c'd home with Life Vest.  At last visit stopped hydralazine due to low BP. Quick look echo last visit by me EF ~20%.  Returns for f/u. Doing great. Very active. No CP or dyspnea. Appetite back and eating a lot. Weighing every day and has been gaining slowly - now about 20 pounds. No edema, orthopnea or PND. No dizzy. SBP 100-125. Compliant with meds. Wearing LifeVest.   ROS: All systems negative except as listed in HPI, PMH and Problem List.  Past Medical History  Diagnosis Date  . HLD (hyperlipidemia)   . GERD (gastroesophageal reflux disease)   . Chronic systolic heart failure     EF 95-62%.   Marland Kitchen CAD (coronary artery disease)     s/p Anterior MI 5/11. LAD stent  . Chronic kidney disease, stage III (moderate)     Current Outpatient Prescriptions  Medication Sig Dispense Refill  . aspirin 81 MG tablet Take 81 mg by mouth daily.        . candesartan (ATACAND) 4 MG tablet Take 1 tablet (4 mg total) by mouth 2 (two) times daily.  60 tablet  6  . carvedilol (COREG) 6.25 MG tablet Take 1 tablet (6.25 mg total) by mouth 2 (two) times daily with a meal.  60 tablet  6  . clopidogrel (PLAVIX) 75 MG tablet Take 75 mg by mouth daily.        . fexofenadine (ALLEGRA) 180 MG tablet Take 180 mg by mouth as needed.       . furosemide (LASIX) 20 MG tablet Take 20 mg by mouth daily. 1 tab daily.......Marland Kitchenevery  third day pt takes 40mg  due to swelling      . hydrALAZINE (APRESOLINE) 25 MG tablet       . ondansetron (ZOFRAN) 8 MG tablet Take 8 mg by mouth every 8 (eight) hours as needed.        . rosuvastatin (CRESTOR) 40 MG tablet Take 1 tablet (40 mg total) by mouth daily.  30 tablet  6  . spironolactone (ALDACTONE) 25 MG tablet Take 12.5 mg by mouth daily.        Marland Kitchen DISCONTD: PLAVIX 75 MG tablet          PHYSICAL EXAM: Filed Vitals:   06/03/10 1347  BP: 118/80  Pulse: 57   General:  Well appearing. No resp difficulty HEENT: normal Neck: supple. JVP flat. Carotids 2+ bilaterally; no bruits. No lymphadenopathy or thryomegaly appreciated. Cor: PMI normal. Wide split s3. Regular rate & rhythm. No rubs, murmurs. Soft s3. Lungs: clear Abdomen: soft, nontender, nondistended. No hepatosplenomegaly. No bruits or masses. Good bowel sounds. Extremities: no cyanosis, clubbing, rash, edema Neuro: alert & orientedx3, cranial nerves grossly intact. Moves all 4 extremities w/o difficulty. Affect pleasant.    ECG: NSR 65 septal infarct. No ST-T wave abnormalities.     ASSESSMENT & PLAN:

## 2010-06-04 ENCOUNTER — Other Ambulatory Visit: Payer: Self-pay | Admitting: *Deleted

## 2010-06-04 MED ORDER — CLOPIDOGREL BISULFATE 75 MG PO TABS: 75.0000 mg | ORAL_TABLET | Freq: Every day | ORAL | Status: DC

## 2010-06-09 ENCOUNTER — Encounter: Payer: Self-pay | Admitting: *Deleted

## 2010-06-14 ENCOUNTER — Ambulatory Visit: Payer: BC Managed Care – PPO | Admitting: Internal Medicine

## 2010-06-21 ENCOUNTER — Ambulatory Visit (INDEPENDENT_AMBULATORY_CARE_PROVIDER_SITE_OTHER): Payer: BC Managed Care – PPO | Admitting: Internal Medicine

## 2010-06-21 ENCOUNTER — Encounter: Payer: Self-pay | Admitting: Internal Medicine

## 2010-06-21 ENCOUNTER — Ambulatory Visit (HOSPITAL_COMMUNITY): Payer: BC Managed Care – PPO | Attending: Internal Medicine

## 2010-06-21 VITALS — BP 114/78 | HR 68 | Ht 72.0 in | Wt 194.0 lb

## 2010-06-21 DIAGNOSIS — I2510 Atherosclerotic heart disease of native coronary artery without angina pectoris: Secondary | ICD-10-CM

## 2010-06-21 DIAGNOSIS — I5022 Chronic systolic (congestive) heart failure: Secondary | ICD-10-CM

## 2010-06-21 DIAGNOSIS — I079 Rheumatic tricuspid valve disease, unspecified: Secondary | ICD-10-CM | POA: Insufficient documentation

## 2010-06-21 DIAGNOSIS — I059 Rheumatic mitral valve disease, unspecified: Secondary | ICD-10-CM | POA: Insufficient documentation

## 2010-06-21 MED ORDER — CARVEDILOL 12.5 MG PO TABS: 12.5000 mg | ORAL_TABLET | Freq: Two times a day (BID) | ORAL | Status: DC

## 2010-06-21 NOTE — Patient Instructions (Signed)
Increase Carvedilol to 12.5 mg Twice daily   Your physician has recommended that you have a cardiopulmonary stress test (CPX). CPX testing is a non-invasive measurement of heart and lung function. It replaces a traditional treadmill stress test. This type of test provides a tremendous amount of information that relates not only to your present condition but also for future outcomes. This test combines measurements of you ventilation, respiratory gas exchange in the lungs, electrocardiogram (EKG), blood pressure and physical response before, during, and following an exercise protocol.  You have been referred to Dr Graciela Husbands for ? defib  Your physician recommends that you schedule a follow-up appointment in: 1 month with Dr Gala Romney

## 2010-06-21 NOTE — Progress Notes (Signed)
HPI:  Derrick Lane is a 50 y/o [employer] with h/o CAD s/p anterior MI in May 2011 treated with DES. Hospitalization c/b ARDS. EF on d/c 40-45%. Has h/o angioedema on ACE-I.   Admitted in 3/12 with acute/chronic CHF. EF down to 10-15%. 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 Diuresed and meds titrated.  Right heart cardiac catheterization performed on March 30, 2010, showing essentially normal hemodynamics and preserved cardiac output and RA of 4, RV 33/1 (3), PA 40/17 (25), PCWP 12.  Cardiac output (Fick) 4.83 liters per minute with an index of 2.42 liters per minute per meter squared. D/c'd home with Life Vest.  At last visit stopped hydralazine due to low BP.   Returns for f/u. Doing great. Very active. No CP or dyspnea. Appetite back and eating a lot. Weighing every day and weight stable. No edema, orthopnea or PND. No dizzy. SBP 100-125. Compliant with meds. Wearing LifeVest.   Echo today (which I reviewed): Mildly dilated LV EF 10-15%. RV mild to moderately HK.   ROS: All systems negative except as listed in HPI, PMH and Problem List.  Past Medical History  Diagnosis Date  . HLD (hyperlipidemia)   . GERD (gastroesophageal reflux disease)   . Chronic systolic heart failure     EF 16-10%.   Marland Kitchen CAD (coronary artery disease)     s/p Anterior MI 5/11. LAD stent  . Chronic kidney disease, stage III (moderate)     Current Outpatient Prescriptions  Medication Sig Dispense Refill  . aspirin 81 MG tablet Take 81 mg by mouth daily.        . candesartan (ATACAND) 4 MG tablet Take 1 tablet (4 mg total) by mouth 2 (two) times daily.  60 tablet  6  . carvedilol (COREG) 6.25 MG tablet Take 1.5 tablets (9.375 mg total) by mouth 2 (two) times daily with a meal.  90 tablet  3  . clopidogrel (PLAVIX) 75 MG tablet Take 1 tablet (75 mg total) by mouth daily.  30 tablet  11  . fexofenadine (ALLEGRA) 180 MG tablet Take 180 mg by mouth as needed.       . furosemide (LASIX) 20 MG tablet Take  20 mg by mouth daily. 1 tab daily.......Marland Kitchenevery third day pt takes 40mg  due to swelling      . hydrALAZINE (APRESOLINE) 25 MG tablet       . ondansetron (ZOFRAN) 8 MG tablet Take 8 mg by mouth every 8 (eight) hours as needed.        . rosuvastatin (CRESTOR) 40 MG tablet Take 1 tablet (40 mg total) by mouth daily.  30 tablet  6  . spironolactone (ALDACTONE) 25 MG tablet Take 12.5 mg by mouth daily.           PHYSICAL EXAM: Filed Vitals:   06/21/10 1020  BP: 114/78  Pulse: 68   Vitals - 1 value per visit 04/15/2010 04/29/2010 06/03/2010 06/21/2010  Weight (lb) 177 183 193 194  HEIGHT 6\' 0"  6\' 0"  6\' 0"  6\' 0"    General:  Well appearing. No resp difficulty HEENT: normal Neck: supple. JVP flat. Carotids 2+ bilaterally; no bruits. No lymphadenopathy or thryomegaly appreciated. Cor: PMI normal. Wide split s3. Regular rate & rhythm. No rubs, murmurs. Soft s3. Lungs: clear Abdomen: soft, nontender, nondistended. No hepatosplenomegaly. No bruits or masses. Good bowel sounds. Extremities: no cyanosis, clubbing, rash, edema Neuro: alert & orientedx3, cranial nerves grossly intact. Moves all 4 extremities w/o difficulty. Affect pleasant.  ASSESSMENT & PLAN:

## 2010-06-21 NOTE — Assessment & Plan Note (Signed)
No evidence of ischemia. Continue current regimen.   

## 2010-06-21 NOTE — Assessment & Plan Note (Addendum)
Doing well. NYHA I. Volume status looks good. Unfortunately EF has not had any significant recovery despite titration of meds as much as BP would tolerate over past few months. Will refer to DR. Graciela Husbands for ICD. Check CPX test. See back in 1 mont and try to titrate atacand to 8 bid.

## 2010-07-08 ENCOUNTER — Ambulatory Visit (HOSPITAL_COMMUNITY): Payer: BC Managed Care – PPO | Attending: Internal Medicine

## 2010-07-08 DIAGNOSIS — R0602 Shortness of breath: Secondary | ICD-10-CM | POA: Insufficient documentation

## 2010-07-08 DIAGNOSIS — I2589 Other forms of chronic ischemic heart disease: Secondary | ICD-10-CM

## 2010-07-08 DIAGNOSIS — I5022 Chronic systolic (congestive) heart failure: Secondary | ICD-10-CM

## 2010-07-13 ENCOUNTER — Encounter: Payer: Self-pay | Admitting: Internal Medicine

## 2010-07-13 ENCOUNTER — Ambulatory Visit (INDEPENDENT_AMBULATORY_CARE_PROVIDER_SITE_OTHER): Payer: BC Managed Care – PPO | Admitting: Internal Medicine

## 2010-07-13 ENCOUNTER — Encounter: Payer: Self-pay | Admitting: *Deleted

## 2010-07-13 DIAGNOSIS — I498 Other specified cardiac arrhythmias: Secondary | ICD-10-CM

## 2010-07-13 DIAGNOSIS — I429 Cardiomyopathy, unspecified: Secondary | ICD-10-CM | POA: Insufficient documentation

## 2010-07-13 DIAGNOSIS — R001 Bradycardia, unspecified: Secondary | ICD-10-CM | POA: Insufficient documentation

## 2010-07-13 NOTE — Assessment & Plan Note (Signed)
We would anticipate a dual-chamber device given his resting bradycardia. This would allow further up titration of his carvedilol.

## 2010-07-13 NOTE — Progress Notes (Signed)
HPI: Derrick Lane is a 50 y.o. male seen at the request of Dr. Dorthea Cove for consideration of ICD implantation.  His history of coronary artery disease with prior anterior MI in May 2011 treated with DES. Hospitalization c/b ARDS. EF on d/c 40-45%. March 2012 he developed acute congestive heart failure. Reassessment of his ejection fraction demonstrated that it was 10-15%. 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 Reassessment of his ejection fraction 2 weeks ago remained 10-15%.   Right heart cardiac catheterization performed on March 30, 2010, showing essentially normal hemodynamics and preserved cardiac output and RA of 4, RV 33/1 (3), PA 40/17 (25), PCWP 12. Cardiac output (Fick) 4.83 liters per minute with an index of 2.42 liters per minute per meter squared. He was subsequently discharged wearing a light fast. Notwithstanding a problem with ACE inhibitor he is tolerated atacand  He has mild exercise intolerance. He has not had nocturnal dyspnea orthopnea peripheral edema or syncope.    Current Outpatient Prescriptions  Medication Sig Dispense Refill  . aspirin 81 MG tablet Take 81 mg by mouth daily.        . candesartan (ATACAND) 4 MG tablet Take 1 tablet (4 mg total) by mouth 2 (two) times daily.  60 tablet  6  . carvedilol (COREG) 12.5 MG tablet Take 1 tablet (12.5 mg total) by mouth 2 (two) times daily with a meal.  60 tablet  6  . clopidogrel (PLAVIX) 75 MG tablet Take 1 tablet (75 mg total) by mouth daily.  30 tablet  11  . fexofenadine (ALLEGRA) 180 MG tablet Take 180 mg by mouth as needed.       . furosemide (LASIX) 20 MG tablet Take 20 mg by mouth daily. 1 tab daily.......Marland Kitchenevery third day pt takes 40mg  due to swelling      . hydrALAZINE (APRESOLINE) 25 MG tablet Take 25 mg by mouth 2 (two) times daily.       . rosuvastatin (CRESTOR) 40 MG tablet Take 1 tablet (40 mg total) by mouth daily.  30  tablet  6  . spironolactone (ALDACTONE) 25 MG tablet Take 12.5 mg by mouth daily.        Marland Kitchen DISCONTD: ondansetron (ZOFRAN) 8 MG tablet Take 8 mg by mouth every 8 (eight) hours as needed.          Allergies  Allergen Reactions  . Ramipril     Past Medical History  Diagnosis Date  . HLD (hyperlipidemia)   . GERD (gastroesophageal reflux disease)   . Chronic systolic heart failure     EF 84-13%.   Marland Kitchen CAD (coronary artery disease)     s/p Anterior MI 5/11. LAD stent  . Chronic kidney disease, stage III (moderate)     No past surgical history on file.  Family History  Problem Relation Age of Onset  . Coronary artery disease      History   Social History  . Marital Status: Married    Spouse Name: N/A    Number of Children: N/A  . Years of Education: N/A   Occupational History  . Not on file.   Social History Main Topics  . Smoking status: Never Smoker   . Smokeless tobacco: Not on file  . Alcohol Use: No  . Drug Use: No  . Sexually Active: Not on file   Other Topics Concern  . Not on file   Social History Narrative  . No narrative  on file  he is married he is retired he has 2 childrenHe does not use cigarettes alcohol or recreational drugs  Fourteen point review of systems was negative except as noted in HPI and PMH   PHYSICAL EXAMINATION  Blood pressure 105/67, pulse 58, height 6' (1.829 m), weight 195 lb (88.451 kg).   Well developed and nourished middle-age Caucasian male appearing his stated age in no acute distress HENT normal Neck supple with JVP-flat Carotids brisk but diminished without bruits Back without scoliosis or kyphosis Clear Regular rate and rhythm S2 is widely split PMI was displaced and sustained Abd-soft with active BS without hepatomegaly or midline pulsation Femoral pulses 2+ distal pulses intact No Clubbing cyanosis edema Skin-warm and dry LN-neg submandibular and supraclavicular A & Oriented CN 3-12 normal  Grossly normal sensory  and motor function Affect engaging . ECG demonstrates sinus rhythm at 51 Intervals 0.17/0.10/0.46 Axis III Prior septal MI

## 2010-07-13 NOTE — Assessment & Plan Note (Signed)
As above.

## 2010-07-13 NOTE — Patient Instructions (Signed)

## 2010-07-13 NOTE — Assessment & Plan Note (Signed)
Stable on current medications. He has persistent left ventricular dysfunction. It is appropriate to consider him for ICD implantation now as ejection fraction has remained low despite more than 3 months of guideline directed pharmacological therapy.  We have reviewed the potential benefits as well as risks and alternatives. These include but aren't a limited to death perforation infection lead dislodgment inappropriate shock and device malfunction he understands these risks and would like to proceed

## 2010-07-22 NOTE — Progress Notes (Signed)
Addended by: Judithe Modest D on: 07/22/2010 12:33 PM   Modules accepted: Orders

## 2010-07-26 ENCOUNTER — Telehealth: Payer: Self-pay | Admitting: *Deleted

## 2010-07-29 NOTE — Telephone Encounter (Signed)
Done in error.

## 2010-08-02 ENCOUNTER — Encounter: Payer: Self-pay | Admitting: Internal Medicine

## 2010-08-02 ENCOUNTER — Ambulatory Visit (INDEPENDENT_AMBULATORY_CARE_PROVIDER_SITE_OTHER): Payer: BC Managed Care – PPO | Admitting: Internal Medicine

## 2010-08-02 ENCOUNTER — Encounter: Payer: Self-pay | Admitting: *Deleted

## 2010-08-02 VITALS — BP 92/62 | HR 50 | Ht 72.0 in | Wt 194.0 lb

## 2010-08-02 DIAGNOSIS — I5022 Chronic systolic (congestive) heart failure: Secondary | ICD-10-CM

## 2010-08-02 DIAGNOSIS — I2510 Atherosclerotic heart disease of native coronary artery without angina pectoris: Secondary | ICD-10-CM

## 2010-08-02 LAB — BASIC METABOLIC PANEL
Calcium: 9.5 mg/dL (ref 8.4–10.5)
GFR: 72.91 mL/min (ref >60.00)
Potassium: 4.2 mEq/L (ref 3.5–5.1)
Sodium: 142 mEq/L (ref 135–145)

## 2010-08-02 LAB — CBC WITH DIFFERENTIAL/PLATELET
Basophils Relative: 0.4 % (ref 0.0–3.0)
Eosinophils Relative: 2.9 % (ref 0.0–5.0)
Hemoglobin: 14.7 g/dL (ref 13.0–17.0)
Lymphocytes Relative: 37.2 % (ref 12.0–46.0)
Monocytes Relative: 9.5 % (ref 3.0–12.0)
Neutro Abs: 3.8 10*3/uL (ref 1.4–7.7)
RBC: 4.71 Mil/uL (ref 4.22–5.81)

## 2010-08-02 NOTE — Progress Notes (Signed)
HPI:  Derrick Lane is a 50 y/o [employer] with h/o CAD s/p anterior MI in May 2011 treated with DES. Hospitalization c/b ARDS. EF on d/c 40-45%. Has h/o angioedema on ACE-I.   Admitted in 3/12 with acute/chronic CHF. EF down to 10-15%. 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 Diuresed and meds titrated.  Right heart cardiac catheterization performed on March 30, 2010, showing essentially normal hemodynamics and preserved cardiac output and RA of 4, RV 33/1 (3), PA 40/17 (25), PCWP 12.  Cardiac output (Fick) 4.83 liters per minute with an index of 2.42 liters per minute per meter squared. D/c'd home with Life Vest.  Hydralazine stopped previously due to low BP.   Returns for f/u. Doing great. Very active. No CP or dyspnea. Appetite back and eating a lot. Weighing every day and weight stable. No edema, orthopnea or PND. No dizzy. SBP 100-125. Compliant with meds. Wearing LifeVest - getting lots of artifact.   Echo 6/12. EF 20-25%. RV mild to moderately HK.  CPX 07/08/10: pVO2 25.6 (74%) slope 34 ME/MVV 67% RER 1.17 Normal HR and BP response.   ROS: All systems negative except as listed in HPI, PMH and Problem List.  Past Medical History  Diagnosis Date  . HLD (hyperlipidemia)   . GERD (gastroesophageal reflux disease)   . Chronic systolic heart failure     EF 16-10%.   Marland Kitchen CAD (coronary artery disease)     s/p Anterior MI 5/11. LAD stent  . Chronic kidney disease, stage III (moderate)     Current Outpatient Prescriptions  Medication Sig Dispense Refill  . aspirin 81 MG tablet Take 81 mg by mouth daily.        . candesartan (ATACAND) 4 MG tablet Take 1 tablet (4 mg total) by mouth 2 (two) times daily.  60 tablet  6  . carvedilol (COREG) 12.5 MG tablet Take 1 tablet (12.5 mg total) by mouth 2 (two) times daily with a meal.  60 tablet  6  . clopidogrel (PLAVIX) 75 MG tablet Take 1 tablet (75 mg total) by mouth daily.  30 tablet  11  . fexofenadine (ALLEGRA) 180 MG tablet Take 180 mg  by mouth as needed.       . furosemide (LASIX) 20 MG tablet Take 20 mg by mouth daily. 1 tab daily.......Marland Kitchenevery third day pt takes 40mg  due to swelling      . rosuvastatin (CRESTOR) 40 MG tablet Take 1 tablet (40 mg total) by mouth daily.  30 tablet  6  . spironolactone (ALDACTONE) 25 MG tablet Take 12.5 mg by mouth daily.           PHYSICAL EXAM: Filed Vitals:   08/02/10 1103  BP: 92/62  Pulse: 50  Wt 194                     General:  Well appearing. No resp difficulty HEENT: normal Neck: supple. JVP flat. Carotids 2+ bilaterally; no bruits. No lymphadenopathy or thryomegaly appreciated. Cor: PMI normal. Wide split s3. Regular rate & rhythm. No rubs, murmurs. Soft s3. Lungs: clear Abdomen: soft, nontender, nondistended. No hepatosplenomegaly. No bruits or masses. Good bowel sounds. Extremities: no cyanosis, clubbing, rash, edema Neuro: alert & orientedx3, cranial nerves grossly intact. Moves all 4 extremities w/o difficulty. Affect pleasant.    ASSESSMENT & PLAN:

## 2010-08-02 NOTE — Assessment & Plan Note (Signed)
Doing very well. Volume status looks great. CPX with mild to moderate functional limitation. Scheduled for ICD this week. Given lack of LV recovery will schedule cardiac cath to make sure revascularization is stable prior to ICD. Continue meds for now. Titrate Atacand after ICD. Discussed with patient and wife.

## 2010-08-02 NOTE — Patient Instructions (Signed)
Labs today  Your physician has requested that you have a cardiac catheterization. Cardiac catheterization is used to diagnose and/or treat various heart conditions. Doctors may recommend this procedure for a number of different reasons. The most common reason is to evaluate chest pain. Chest pain can be a symptom of coronary artery disease (CAD), and cardiac catheterization can show whether plaque is narrowing or blocking your heart's arteries. This procedure is also used to evaluate the valves, as well as measure the blood flow and oxygen levels in different parts of your heart. For further information please visit [email or web address] Please follow instruction sheet, as given.  Your physician recommends that you schedule a follow-up appointment in: 1 month in Heart Failure clinic

## 2010-08-03 ENCOUNTER — Inpatient Hospital Stay (HOSPITAL_BASED_OUTPATIENT_CLINIC_OR_DEPARTMENT_OTHER)
Admission: RE | Admit: 2010-08-03 | Discharge: 2010-08-03 | Disposition: A | Discharge status: Home / Self Care | Payer: BC Managed Care – PPO | Source: Ambulatory Visit | Attending: Internal Medicine | Admitting: Internal Medicine

## 2010-08-03 DIAGNOSIS — I2510 Atherosclerotic heart disease of native coronary artery without angina pectoris: Secondary | ICD-10-CM

## 2010-08-03 DIAGNOSIS — Z01811 Encounter for preprocedural respiratory examination: Secondary | ICD-10-CM

## 2010-08-03 DIAGNOSIS — I428 Other cardiomyopathies: Secondary | ICD-10-CM | POA: Insufficient documentation

## 2010-08-03 DIAGNOSIS — I509 Heart failure, unspecified: Secondary | ICD-10-CM

## 2010-08-03 DIAGNOSIS — I519 Heart disease, unspecified: Secondary | ICD-10-CM | POA: Insufficient documentation

## 2010-08-03 DIAGNOSIS — Z9861 Coronary angioplasty status: Secondary | ICD-10-CM | POA: Insufficient documentation

## 2010-08-05 ENCOUNTER — Ambulatory Visit (HOSPITAL_COMMUNITY): Payer: BC Managed Care – PPO

## 2010-08-05 ENCOUNTER — Ambulatory Visit (HOSPITAL_COMMUNITY)
Admission: RE | Admit: 2010-08-05 | Discharge: 2010-08-06 | Disposition: A | Discharge status: Home / Self Care | Payer: BC Managed Care – PPO | Source: Ambulatory Visit | Attending: Internal Medicine | Admitting: Internal Medicine

## 2010-08-05 DIAGNOSIS — I2589 Other forms of chronic ischemic heart disease: Secondary | ICD-10-CM

## 2010-08-05 DIAGNOSIS — I2510 Atherosclerotic heart disease of native coronary artery without angina pectoris: Secondary | ICD-10-CM | POA: Insufficient documentation

## 2010-08-05 DIAGNOSIS — N183 Chronic kidney disease, stage 3 unspecified: Secondary | ICD-10-CM | POA: Insufficient documentation

## 2010-08-05 DIAGNOSIS — I252 Old myocardial infarction: Secondary | ICD-10-CM | POA: Insufficient documentation

## 2010-08-05 DIAGNOSIS — E785 Hyperlipidemia, unspecified: Secondary | ICD-10-CM | POA: Insufficient documentation

## 2010-08-05 DIAGNOSIS — R7309 Other abnormal glucose: Secondary | ICD-10-CM | POA: Insufficient documentation

## 2010-08-05 DIAGNOSIS — Z9861 Coronary angioplasty status: Secondary | ICD-10-CM | POA: Insufficient documentation

## 2010-08-05 LAB — SURGICAL PCR SCREEN
MRSA, PCR: NEGATIVE
Staphylococcus aureus: POSITIVE — AB

## 2010-08-06 ENCOUNTER — Ambulatory Visit (HOSPITAL_COMMUNITY): Payer: BC Managed Care – PPO

## 2010-08-19 ENCOUNTER — Ambulatory Visit: Payer: BC Managed Care – PPO | Admitting: *Deleted

## 2010-08-19 ENCOUNTER — Ambulatory Visit (INDEPENDENT_AMBULATORY_CARE_PROVIDER_SITE_OTHER): Payer: BC Managed Care – PPO | Admitting: *Deleted

## 2010-08-19 DIAGNOSIS — I428 Other cardiomyopathies: Secondary | ICD-10-CM

## 2010-08-19 DIAGNOSIS — I498 Other specified cardiac arrhythmias: Secondary | ICD-10-CM

## 2010-08-19 NOTE — Progress Notes (Signed)
Device check by research./ also wound check

## 2010-08-23 ENCOUNTER — Ambulatory Visit: Payer: BC Managed Care – PPO | Admitting: *Deleted

## 2010-08-24 ENCOUNTER — Encounter: Payer: Self-pay | Admitting: Internal Medicine

## 2010-08-30 NOTE — Op Note (Signed)
NAMEASHISH, ROSSETTI NO.:  [id number]  MEDICAL RECORD NO.:  [id number]  LOCATION:                                 FACILITY:  PHYSICIAN:  Duke Salvia, MD, FACCDATE OF BIRTH:  27-May-1960  DATE OF PROCEDURE: DATE OF DISCHARGE:                              OPERATIVE REPORT   Mr. Antrim is a 50 year old gentleman with a history of ischemic heart disease, prior MI, and then progressive adverse remodelling with ejection fraction of 15%-20%.  He is admitted for ICD implantation for primary prevention.  Following obtaining informed consent, the patient was brought to the electrophysiology laboratory, and placed on the fluoroscopic table in supine position.  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  An incision was made and carried down to layer of the prepectoral fascia using electrocautery and sharp dissection.  A pocket was formed similarly.  Hemostasis was obtained.  Thereafter attention was turned to gain access to extrathoracic left subclavian vein, which was accomplished without difficulty and without the aspiration of air or puncture of the artery.  Initially, a single venipuncture was accomplished with anticipation of a single chamber ICD implantation.  A 9-French sheath was placed, through which was passed a [employer] [phone] Gore-Tex covered lead serial number [id number]. Under fluoroscopic guidance, it was manipulated to the right ventricular apex for the bipolar R-wave of 11.8 with a pace impedance of 550 and a threshold of 0.9 at 0.5.  Current threshold was 1.8 MA and the current of injury was modest.  This lead was secured to the prepectoral fascia. At this point, it was noted that the patient's heart rate was 52 and was elected to put in the atrial lead.  Access was again obtained under fluoroscopic guidance.  We are cephalad  to the prior lead.  No approach to the ventricular lead was noted.  Single puncture was required.  A 7- French sheath was placed, through this was passed a [employer]. [employer] 2088 TC, 52- cm lead, serial number [id number].  Under fluoroscopic guidance, it was manipulated to the right atrial appendage with bipolar P-wave of 2.7 with pace impedance of 575, threshold 1.4 at 0.5 milliseconds, current was 2.8 MA.  The current of injury was brisk.  This lead was secured to prepectoral fascia and then the leads were attached to a St. Jude CD 2241 ICD, serial number [id number].  Through the device, bipolar P-wave was 2.6 with pace impedance of 480, threshold 0.7 at 0.5.  The R-wave was 11.7 with a pace impedance of 520, threshold 0.5 at 0.5.  High-voltage impedance was 40 ohms.  The pulse was programmed accordingly.  The pocket was copiously irrigated with antibiotic containing saline solution.  Hemostasis was assured.  The leads and pulse generator were placed in the pocket.  Defibrillation threshold testing was undertaken.  Ventricular fibrillation was induced via T-wave shock.  After total duration of approximately 7 seconds, a 15.4 joules shock was delivered through a measured resistance of 43 ohms, terminating ventricular fibrillation and restoring sinus rhythm.  The device was implanted.  The pocket was  then closed in three layers in normal fashion. The wound was washed, dried, and a benzoin and Steri-Strip dressing was applied.  Needle counts, sponge counts, and instrument counts were correct at the end of the procedure according to staff.  The patient tolerated the procedure without apparent complication.     Duke Salvia, MD, [facility]     SCK/MEDQ  D:  08/05/2010  T:  08/05/2010  Job:  [id number]  Electronically Signed by Sherryl Manges MD [facility] on 08/30/2010 01:52:06 PM

## 2010-08-30 NOTE — Discharge Summary (Signed)
NAMEKATHAN, KIRKER NO.:  [id number]  MEDICAL RECORD NO.:  [id number]  LOCATION:  [id number]                         FACILITY:  [facility]  PHYSICIAN:  Duke Salvia, MD, FACCDATE OF BIRTH:  1960/10/27  DATE OF ADMISSION:  08/05/2010 DATE OF DISCHARGE:  08/06/2010                              DISCHARGE SUMMARY   PRIMARY CARDIOLOGIST:  Bevelyn Buckles. Bensimhon, MD.  ELECTROPHYSIOLOGIST:  Duke Salvia, MD, [facility].  PRIMARY DIAGNOSIS:  Ischemic cardiomyopathy with an ejection fraction of less than 35% despite optimal medical therapy.  SECONDARY DIAGNOSES: 1. Coronary artery disease, status post inferior myocardial infarction     with stenting to the left anterior descending in May 2011. 2. Hyperlipidemia. 3. Gastroesophageal reflux disease. 4. Borderline diabetes. 5. Stage 3 chronic kidney disease.  ALLERGIES:  RAMIPRIL causes angioedema.  PROCEDURES THIS ADMISSION: 1. Implantation of a dual-chamber implant of a cardiac defibrillator     on August 05, 2010, by Dr. Graciela Husbands.  The patient received a [employer]. Jude     medical number 2241 ICD with model number 2088TC right atrial lead,     and model number 0185 right ventricle lead.  DFT at time of implant was     less than or equal to 15 joules.  The patient had no early apparent     complications. 2. Chest x-ray on August 06, 2010, demonstrated no pneumothorax, status     post defibrillator implant.  BRIEF HISTORY OF PRESENT ILLNESS:  Mr. Alen is a 50 year old male with a history of coronary artery disease and mix of ischemic and nonischemic cardiomyopathies.  He was referred to Dr. Graciela Husbands in the outpatient setting for evaluation of ICD implant for primary prevention.  Because of his persistent left ventricular dysfunction, this was felt to be appropriate.  Risks, benefits, alternatives of the procedure were reviewed with the patient and he wished to proceed.  HOSPITAL COURSE:  The patient was admitted on August 05, 2010,  with planning of implantation of an implantable cardiac defibrillator.  It was carried out by Dr. Graciela Husbands, details are as outlined above.  He was monitored on telemetry overnight, which demonstrated sinus rhythm with occasional PVCs.  Chest x-ray was obtained and demonstrated no pneumothorax.  The device was interrogated and found to be functioning normally.  The patient's left chest was without hematoma or ecchymosis. Dr. Graciela Husbands examined the patient on August 06, 2010, and considered stable for discharge.  FOLLOWUP APPOINTMENTS: 1. [facility] in the [place] office on August 19, 2010 at 3:30 p.m. 2. Dr.  Graciela Husbands on November 12, 2010, at 10:30 a.m. 3. Dr. Gala Romney as scheduled.  DISCHARGE INSTRUCTIONS: 1. Increase activity slowly. 2. No driving for 1 week. 3. Follow a 1ow-sodium heart-healthy diet. 4. See supplemental device discharge instructions for wound care and     mobility. 5. Keep incision clean or dry for 1 week.  DISCHARGE MEDICATIONS: 1. Furosemide 20 mg one table daily. 2. Spironolactone 25 mg one half tablet daily. 3. Atacand 4 mg twice daily. 4. Coreg 12.5 1 tablet twice daily. 5. Plavix 75 mg daily. 6. Crestor 40 mg daily. 7. Aspirin 81 mg daily.  DISPOSITION:  Home.  The patient was seen and examined by Dr. Graciela Husbands on August 06, 2010, and considered stable for discharge.  DURATION OF DISCHARGE ENCOUNTER:  Thirty-five minute.     Gypsy Balsam, RN,BSN   ______________________________ Duke Salvia, MD, [facility]    AS/MEDQ  D:  08/06/2010  T:  08/06/2010  Job:  [id number]  cc:   Bevelyn Buckles. Bensimhon, MD  Electronically Signed by Gypsy Balsam RNBSN on 08/20/2010 01:15:32 PM Electronically Signed by Sherryl Manges MD [facility] on 08/30/2010 01:52:08 PM

## 2010-08-31 ENCOUNTER — Ambulatory Visit (HOSPITAL_COMMUNITY)
Admission: RE | Admit: 2010-08-31 | Discharge: 2010-08-31 | Disposition: A | Discharge status: Home / Self Care | Payer: BC Managed Care – PPO | Source: Ambulatory Visit | Attending: Internal Medicine | Admitting: Internal Medicine

## 2010-08-31 ENCOUNTER — Other Ambulatory Visit (HOSPITAL_COMMUNITY): Payer: Self-pay | Admitting: Pharmacist

## 2010-08-31 VITALS — BP 114/72 | HR 66

## 2010-08-31 DIAGNOSIS — I5022 Chronic systolic (congestive) heart failure: Secondary | ICD-10-CM | POA: Insufficient documentation

## 2010-08-31 NOTE — Patient Instructions (Signed)
Increase Atacand to 8 mg Twice daily, do this slowly by increasing evening dose for about a week then both doses to 8 mg Twice daily   Labs in 2 weeks at the [facility] (bmet [phone])  Your physician recommends that you schedule a follow-up appointment in: 1 month

## 2010-08-31 NOTE — Assessment & Plan Note (Addendum)
Doing very well. Essentially NYHA I - early II now. BP still soft. Will try to increase Atacand to 8 bid. Will start with 4mg  in am and 8mg  in pm for 1 week then 8 and 8. Draw BMET in 2 weeks. F/u in 1 month. Discussed plan with his wife by phone.   Patient seen and examined with Tonye Becket, NP. We discussed all aspects of the encounter. I agree with the assessment and plan as stated above.

## 2010-08-31 NOTE — Cardiovascular Report (Signed)
  Derrick Lane, Derrick Lane              ACCOUNT NO.:  [id number]  MEDICAL RECORD NO.:  [id number]  LOCATION:  CATH                         FACILITY:  [facility]  PHYSICIAN:  Bevelyn Buckles. Bensimhon, MDDATE OF BIRTH:  Jul 25, 1960  DATE OF PROCEDURE:  08/03/2010 DATE OF DISCHARGE:                           CARDIAC CATHETERIZATION   PATIENT IDENTIFICATION:  Derrick Lane is a very pleasant 50 year old state [employer] with a history of coronary artery disease, congestive heart failure, secondary ischemic cardiomyopathy.  He is status post stenting of his LAD and OM-1 approximately a year ago.  Subsequently, he had worsening LV dysfunction.  He is scheduled to have an  Dictation ended at this point.     Bevelyn Buckles. Bensimhon, MD     DRB/MEDQ  D:  08/03/2010  T:  08/04/2010  Job:  [id number]  Electronically Signed by Arvilla Meres MD on 08/31/2010 05:22:50 PM

## 2010-08-31 NOTE — Cardiovascular Report (Signed)
  NAMEJARMEL, LINHARDT              ACCOUNT NO.:  [id number]  MEDICAL RECORD NO.:  [id number]  LOCATION:  CATH                         FACILITY:  [facility]  PHYSICIAN:  Bevelyn Buckles. Bensimhon, MDDATE OF BIRTH:  01/12/1960  DATE OF PROCEDURE:  08/03/2010 DATE OF DISCHARGE:                           CARDIAC CATHETERIZATION   Draiden is a very pleasant 50 year old [employer] with a history of coronary artery disease.  He is status post stenting of his LAD and OM-1 in 2011.  Subsequent to that, he has had significant LV dysfunction which is not improved with medical therapy.  He is scheduled for an ICD on Thursday.  We brought him today to reevaluate his coronary anatomy to rule out coronary insufficiency as a contributor to his worsening LV dysfunction.  PROCEDURES PERFORMED: 1. Selective coronary angiography. 2. Left heart cath. 3. Left ventriculogram.  DESCRIPTION OF PROCEDURE:  The risks and indication were explained. Consent was signed and placed on the chart.  Right groin area was prepped and draped in routine sterile fashion and anesthetized with 1% local lidocaine.  A 4-French arterial sheath was placed on the right femoral artery using a modified Seldinger technique.  Standard catheters including a JL-4, 3-D RCA and angled pigtail were used there.  All catheter exchanges were made over wire.  There are no apparent complications.  Central aortic pressure is 100/64 with a mean of 82.  LV pressure 96/9 with an EDP of 24.  There is no aortic stenosis.  Left main was normal.  LAD was a long vessel coursing to the apex gave off a large first diagonal and too small second and third diagonal.  There is a 40% stenosis in the ostial LAD.  This was followed by a long area of stenting to the proximal midsection.  The stented section was widely patent.  There is a 40% lesion in the first diagonal.  The ostium of the very small second diagonal was had moderate stenosis due to the  jailing from the stent.  Left circumflex had a 30-40% lesion proximally.  It gave off an OM-1. There was a stent in the proximal portion of the OM-1 which was widely patent.  Just before the stent in the ostium of the OM-1, there was some mild narrowing.  There was tiny OM-2.  Right coronary artery was a dominant vessel that was moderate size is a 40% proximal lesion and 30% lesion in the moderate size PDA.  Left ventriculogram done in the RAO position showed a dilated ventricle. 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  Ejection fraction estimated at approximately 15%.  ASSESSMENT: 1. Coronary artery disease as described above with patent stents. 2. Ischemic cardiomyopathy with ejection fraction of 15%.  Plan will be to continue medical therapy and proceed with ICD placement by Dr. Graciela Husbands later this week.     Bevelyn Buckles. Bensimhon, MD     DRB/MEDQ  D:  08/03/2010  T:  08/04/2010  Job:  [id number]  Electronically Signed by Arvilla Meres MD on 08/31/2010 05:22:46 PM

## 2010-08-31 NOTE — Progress Notes (Signed)
HPI:  Derrick Lane is a 50 y/o state [employer] with h/o CAD s/p anterior MI in May 2011 treated with DES. Hospitalization c/b ARDS. EF on d/c 40-45%. Has h/o angioedema on ACE-I.   Admitted in 3/12 with acute/chronic CHF. EF down to 10-15%. 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 Diuresed and meds titrated.  Right heart cardiac catheterization performed on March 30, 2010, showing essentially normal hemodynamics and preserved cardiac output and RA of 4, RV 33/1 (3), PA 40/17 (25), PCWP 12.  Cardiac output (Fick) 4.83 liters per minute with an index of 2.42 liters per minute per meter squared.   Echo 6/12. EF 20-25%. RV mild to moderately HK.  CPX 07/08/10: pVO2 25.6 (74%) slope 34 ME/MVV 67% RER 1.17 Normal HR and BP response.  Underwent LHC 08/03/2010: LAD 40% stent patent LCX 30-40% prox OM-1 stent patent RCA 40% EF 15%. Underwent ICD placement earlier this month - St. Jude dual chamber ICD on Aug 05, 2010  Hydralazine stopped previously due to low BP.   Returns for f/u. Doing great. Very active. No CP or dyspnea.No edema, orthopnea or PND. No dizziness. SBP 90-100.  Compliant with meds.    ROS: All systems negative except as listed in HPI, PMH and Problem List.  Past Medical History  Diagnosis Date  . HLD (hyperlipidemia)   . GERD (gastroesophageal reflux disease)   . Chronic systolic heart failure     EF 16-10%.   Marland Kitchen CAD (coronary artery disease)     s/p Anterior MI 5/11. LAD stent  . Chronic kidney disease, stage III (moderate)     Current Outpatient Prescriptions  Medication Sig Dispense Refill  . aspirin 81 MG tablet Take 81 mg by mouth daily.        . candesartan (ATACAND) 4 MG tablet Take 1 tablet (4 mg total) by mouth 2 (two) times daily.  60 tablet  6  . carvedilol (COREG) 12.5 MG tablet Take 1 tablet (12.5 mg total) by mouth 2 (two) times daily with a meal.  60 tablet  6  . cetirizine (ZYRTEC) 10 MG tablet Take 10 mg by mouth daily.        . clopidogrel (PLAVIX) 75 MG  tablet Take 1 tablet (75 mg total) by mouth daily.  30 tablet  11  . furosemide (LASIX) 20 MG tablet Take 20 mg by mouth daily. 1 tab daily.......Marland Kitchenevery third day pt takes 40mg  due to swelling      . rosuvastatin (CRESTOR) 40 MG tablet Take 1 tablet (40 mg total) by mouth daily.  30 tablet  6  . spironolactone (ALDACTONE) 25 MG tablet Take 12.5 mg by mouth daily.           PHYSICAL EXAM: Filed Vitals:   08/31/10 1023  BP: 114/72  Pulse: 66  Wt 194  General:  Well appearing. No resp difficulty HEENT: normal Neck: supple. JVP flat. Carotids 2+ bilaterally; no bruits. No lymphadenopathy or thryomegaly appreciated. Cor: PMI normal. Wide split s3. Regular rate & rhythm. No rubs, murmurs. Soft s3. Lungs: clear Abdomen: soft, nontender, nondistended. No hepatosplenomegaly. No bruits or masses. Good bowel sounds. Extremities: no cyanosis, clubbing, rash, edema Neuro: alert & orientedx3, cranial nerves grossly intact. Moves all 4 extremities w/o difficulty. Affect pleasant.    ASSESSMENT & PLAN:

## 2010-09-20 ENCOUNTER — Encounter: Payer: Self-pay | Admitting: Internal Medicine

## 2010-09-27 ENCOUNTER — Ambulatory Visit (HOSPITAL_COMMUNITY)
Admission: RE | Admit: 2010-09-27 | Discharge: 2010-09-27 | Disposition: A | Discharge status: Home / Self Care | Payer: BC Managed Care – PPO | Source: Ambulatory Visit | Attending: Internal Medicine | Admitting: Internal Medicine

## 2010-09-27 VITALS — BP 108/72 | HR 62 | Wt 202.8 lb

## 2010-09-27 DIAGNOSIS — I5022 Chronic systolic (congestive) heart failure: Secondary | ICD-10-CM | POA: Insufficient documentation

## 2010-09-27 DIAGNOSIS — I2510 Atherosclerotic heart disease of native coronary artery without angina pectoris: Secondary | ICD-10-CM | POA: Insufficient documentation

## 2010-09-27 NOTE — Progress Notes (Signed)
HPI:  Derrick Lane is a 50 y/o state [employer] with h/o CAD s/p anterior MI in May 2011 treated with DES. Hospitalization c/b ARDS. EF on d/c 40-45%. Has h/o angioedema on ACE-I.   Admitted in 3/12 with acute/chronic CHF. EF down to 10-15%. 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 Diuresed and meds titrated.  Right heart cardiac catheterization performed on March 30, 2010, showing essentially normal hemodynamics and preserved cardiac output and RA of 4, RV 33/1 (3), PA 40/17 (25), PCWP 12.  Cardiac output (Fick) 4.83 liters per minute with an index of 2.42 liters per minute per meter squared.   Echo 6/12. EF 20-25%. RV mild to moderately HK.  CPX 07/08/10: pVO2 25.6 (74%) slope 34 ME/MVV 67% RER 1.17 Normal HR and BP response.  Underwent LHC 08/03/2010: LAD 40% stent patent LCX 30-40% prox OM-1 stent patent RCA 40% EF 15%. Underwent ICD placement earlier this month - St. Jude dual chamber ICD on Aug 05, 2010.  Hydralazine stopped previously due to low BP.   Last visit his Adacand was titrated, he has tolerated this well.  He returns for follow up today.  He has done well since placement of his ICD.  He has started playing golf again over th last week.  He walked a par 3 course without dyspnea.  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  No CP, edema, orthopnea or PND. No dizziness. SBP 106-112/60-72 at home.  His weight remains stable 191-192 although he is up several pounds from being away from home over the last week and not able to eat healthy.  He is compliant with meds.     ROS: All systems negative except as listed in HPI, PMH and Problem List.  Past Medical History  Diagnosis Date  . HLD (hyperlipidemia)   . GERD (gastroesophageal reflux disease)   . Chronic systolic heart failure     EF 11-91%.   Marland Kitchen CAD (coronary artery disease)     s/p Anterior MI 5/11. LAD stent  . Chronic kidney disease, stage III (moderate)     Current Outpatient Prescriptions  Medication Sig Dispense  Refill  . aspirin 81 MG tablet Take 81 mg by mouth daily.        . candesartan (ATACAND) 4 MG tablet Take by mouth. Take 4 mg in AM and 2 tabs in PM       . carvedilol (COREG) 25 MG tablet Take 25 mg by mouth 2 (two) times daily with a meal.        . cetirizine (ZYRTEC) 10 MG tablet Take 10 mg by mouth daily.        . clopidogrel (PLAVIX) 75 MG tablet Take 1 tablet (75 mg total) by mouth daily.  30 tablet  11  . furosemide (LASIX) 20 MG tablet Take 20 mg by mouth daily. 1 tab daily.......Marland Kitchenevery third day pt takes 40mg  due to swelling      . rosuvastatin (CRESTOR) 40 MG tablet Take 1 tablet (40 mg total) by mouth daily.  30 tablet  6  . spironolactone (ALDACTONE) 25 MG tablet Take 12.5 mg by mouth daily.           PHYSICAL EXAM: Filed Vitals:   09/27/10 1403  BP: 108/72  Pulse: 62  Wt 194  General:  Well appearing. No resp difficulty HEENT: normal Neck: supple. JVP flat. Carotids 2+ bilaterally; no bruits. No lymphadenopathy or thryomegaly appreciated. Cor: PMI normal. Regular rate & rhythm. No rubs, murmurs.  Lungs: clear Abdomen: soft, nontender, nondistended. No hepatosplenomegaly. No bruits or masses. Good bowel sounds. Extremities: no cyanosis, clubbing, rash, edema Neuro: alert & orientedx3, cranial nerves grossly intact. Moves all 4 extremities w/o difficulty. Affect pleasant.    ASSESSMENT & PLAN:

## 2010-09-27 NOTE — Patient Instructions (Signed)
Have labs drawn in 2 weeks.  Increase Spironolactone to 25 mg daily.    Follow up in 4 weeks.

## 2010-09-27 NOTE — Assessment & Plan Note (Signed)
Doing well, no ischemic symptoms.  Continue meds.

## 2010-09-27 NOTE — Assessment & Plan Note (Addendum)
Volume status looks good.  NYHA I.  He is 7 weeks post ICD which is followed by Dr. Graciela Husbands.  Will increase spironolactone 25 mg daily and check BMET in 2 weeks.  Will try to decrease lasix next visit.  Discussed with his wife, Corrine, by phone.   Patient seen and examined with Ulyess Blossom PA-C. We discussed all aspects of the encounter. I agree with the assessment and plan as stated above.

## 2010-09-29 ENCOUNTER — Other Ambulatory Visit: Payer: Self-pay | Admitting: *Deleted

## 2010-09-29 MED ORDER — NITROGLYCERIN 0.4 MG SL SUBL: 0.4000 mg | SUBLINGUAL_TABLET | SUBLINGUAL | Status: DC | PRN

## 2010-10-31 NOTE — Progress Notes (Signed)
Patient seen and examined with Nicki Bradley PA-C. We discussed all aspects of the encounter. I agree with the assessment and plan as stated above.   

## 2010-11-01 ENCOUNTER — Encounter (HOSPITAL_COMMUNITY): Payer: Self-pay

## 2010-11-01 ENCOUNTER — Ambulatory Visit (HOSPITAL_COMMUNITY)
Admission: RE | Admit: 2010-11-01 | Discharge: 2010-11-01 | Disposition: A | Discharge status: Home / Self Care | Payer: BC Managed Care – PPO | Source: Ambulatory Visit | Attending: Internal Medicine | Admitting: Internal Medicine

## 2010-11-01 DIAGNOSIS — I5022 Chronic systolic (congestive) heart failure: Secondary | ICD-10-CM

## 2010-11-01 DIAGNOSIS — I2510 Atherosclerotic heart disease of native coronary artery without angina pectoris: Secondary | ICD-10-CM

## 2010-11-01 DIAGNOSIS — E785 Hyperlipidemia, unspecified: Secondary | ICD-10-CM | POA: Insufficient documentation

## 2010-11-01 LAB — LIPID PANEL
Cholesterol: 167 mg/dL (ref 0–200)
LDL Cholesterol: 70 mg/dL (ref 0–99)
Triglycerides: 191 mg/dL — ABNORMAL HIGH (ref <150)

## 2010-11-01 LAB — COMPREHENSIVE METABOLIC PANEL
BUN: 16 mg/dL (ref 6–23)
CO2: 27 mEq/L (ref 19–32)
Chloride: 103 mEq/L (ref 96–112)
Creatinine, Ser: 1.02 mg/dL (ref 0.50–1.35)
GFR calc non Af Amer: 84 mL/min — ABNORMAL LOW (ref >90)
Total Bilirubin: 0.7 mg/dL (ref 0.3–1.2)

## 2010-11-01 MED ORDER — CANDESARTAN CILEXETIL 8 MG PO TABS: 16.0000 mg | ORAL_TABLET | Freq: Two times a day (BID) | ORAL | Status: DC

## 2010-11-01 NOTE — Progress Notes (Signed)
HPI:  Derrick Lane is a 50 y/o state [employer] with h/o CAD s/p anterior MI in May 2011 treated with DES. Hospitalization c/b ARDS. EF on d/c 40-45%. Has h/o angioedema on ACE-I.   Admitted in 3/12 with acute/chronic CHF. EF down to 10-15%. 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 Diuresed and meds titrated.  Right heart cardiac catheterization performed on March 30, 2010, showing essentially normal hemodynamics and preserved cardiac output and RA of 4, RV 33/1 (3), PA 40/17 (25), PCWP 12.  Cardiac output (Fick) 4.83 liters per minute with an index of 2.42 liters per minute per meter squared.   Echo 6/12. EF 20-25%. RV mild to moderately HK.  CPX 07/08/10: pVO2 25.6 (74%) slope 34 ME/MVV 67% RER 1.17 Normal HR and BP response.  Underwent LHC 08/03/2010: LAD 40% stent patent LCX 30-40% prox OM-1 stent patent RCA 40% EF 15%. Underwent ICD placement earlier this month - St. Jude dual chamber ICD on Aug 05, 2010.  Hydralazine stopped previously due to low BP.   Last visit his Adacand was titrated, he has tolerated this well.    He returns for follow up today.  He has occasional dizziness.  He is playing golf 5 times a week.  SOB walking up hill at race yesterday. 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  No CP, edema, orthopnea or PND.  SBP 110/65.  His weight at home is  201 which is a 10 pound weight gain. He did take one extra Lasix over the last week.  Taking all medications. He has been eating more fast food.     ROS: All systems negative except as listed in HPI, PMH and Problem List.  Past Medical History  Diagnosis Date  . HLD (hyperlipidemia)   . GERD (gastroesophageal reflux disease)   . Chronic systolic heart failure     EF 01-60%.   Marland Kitchen CAD (coronary artery disease)     s/p Anterior MI 5/11. LAD stent  . Chronic kidney disease, stage III (moderate)     Current Outpatient Prescriptions  Medication Sig Dispense Refill  . aspirin 81 MG tablet Take 81 mg by mouth daily.         . candesartan (ATACAND) 8 MG tablet Take 8 mg by mouth 2 (two) times daily.        . carvedilol (COREG) 12.5 MG tablet Take 12.5 mg by mouth 2 (two) times daily with a meal.        . cetirizine (ZYRTEC) 10 MG tablet Take 10 mg by mouth daily.        . clopidogrel (PLAVIX) 75 MG tablet Take 1 tablet (75 mg total) by mouth daily.  30 tablet  11  . furosemide (LASIX) 20 MG tablet Take 20 mg by mouth daily. 1 tab daily.......Marland Kitchenevery third day pt takes 40mg  due to swelling      . nitroGLYCERIN (NITROSTAT) 0.4 MG SL tablet Place 1 tablet (0.4 mg total) under the tongue every 5 (five) minutes as needed for chest pain.  25 tablet  9  . rosuvastatin (CRESTOR) 40 MG tablet Take 1 tablet (40 mg total) by mouth daily.  30 tablet  6  . spironolactone (ALDACTONE) 25 MG tablet Take 25 mg by mouth daily.          PHYSICAL EXAM: Filed Vitals:   11/01/10 1328  BP: 108/72  Pulse: 56  Wt 207 (194)  General:  Well appearing. No resp difficulty HEENT: normal Neck: supple. JVP flat.  Carotids 2+ bilaterally; no bruits. No lymphadenopathy or thryomegaly appreciated. Cor: PMI normal. Regular rate & rhythm. No rubs, murmurs.  Lungs: clear Abdomen: soft, nontender, nondistended. No hepatosplenomegaly. No bruits or masses. Good bowel sounds. Extremities: no cyanosis, clubbing, rash, edema Neuro: alert & orientedx3, cranial nerves grossly intact. Moves all 4 extremities w/o difficulty. Affect pleasant.    ASSESSMENT & PLAN:

## 2010-11-01 NOTE — Assessment & Plan Note (Signed)
Checking lipids today. Goal LDL < 70. Continue statin.

## 2010-11-01 NOTE — Patient Instructions (Signed)
Please increase Atacand 16 mg in am and pm  Please follow up in 3-4 weeks

## 2010-11-01 NOTE — Progress Notes (Signed)
Patient seen and examined with Amy Clegg, NP. We discussed all aspects of the encounter. I agree with the assessment and plan as stated above.   

## 2010-11-01 NOTE — Assessment & Plan Note (Addendum)
NYHA I. Volume status stable. Tolerating medications.  Will increase Atacand 16 mg BID. Encouraged to return to low salt diet. Encouraged to exercise at a low intensity.  Will check CMET/lipids today.   Patient seen and examined with Tonye Becket, NP. We discussed all aspects of the encounter. I agree with the assessment and plan as stated above.

## 2010-11-01 NOTE — Assessment & Plan Note (Addendum)
Doing well, no ischemic symptoms.  Check lipid panel today. Continue meds.

## 2010-11-12 ENCOUNTER — Encounter: Payer: Self-pay | Admitting: Internal Medicine

## 2010-11-12 ENCOUNTER — Ambulatory Visit (INDEPENDENT_AMBULATORY_CARE_PROVIDER_SITE_OTHER): Payer: BC Managed Care – PPO | Admitting: Internal Medicine

## 2010-11-12 DIAGNOSIS — I2510 Atherosclerotic heart disease of native coronary artery without angina pectoris: Secondary | ICD-10-CM

## 2010-11-12 DIAGNOSIS — Z95810 Presence of automatic (implantable) cardiac defibrillator: Secondary | ICD-10-CM

## 2010-11-12 DIAGNOSIS — I5022 Chronic systolic (congestive) heart failure: Secondary | ICD-10-CM

## 2010-11-12 DIAGNOSIS — I429 Cardiomyopathy, unspecified: Secondary | ICD-10-CM

## 2010-11-12 LAB — ICD DEVICE OBSERVATION
AL IMPEDENCE ICD: 462.5 Ohm
ATRIAL PACING ICD: 0.05 pct
BAMS-0001: 180 {beats}/min
DEV-0020ICD: NEGATIVE
FVT: 0
RV LEAD AMPLITUDE: 12 mv
RV LEAD THRESHOLD: 0.75 V
TOT-0008: 0
TZAT-0001SLOWVT: 1
TZAT-0004FASTVT: 8
TZAT-0004SLOWVT: 8
TZAT-0012FASTVT: 200 ms
TZAT-0012SLOWVT: 200 ms
TZAT-0013FASTVT: 2
TZAT-0020FASTVT: 1 ms
TZAT-0020SLOWVT: 1 ms
TZON-0005FASTVT: 6
TZON-0010SLOWVT: 40 ms
TZST-0001FASTVT: 3
TZST-0001FASTVT: 5
TZST-0001SLOWVT: 3
TZST-0001SLOWVT: 4
TZST-0003FASTVT: 800 V
TZST-0003FASTVT: 890 V
TZST-0003SLOWVT: 650 V
TZST-0003SLOWVT: 890 V
VENTRICULAR PACING ICD: 0.06 pct

## 2010-11-12 NOTE — Assessment & Plan Note (Signed)
The patient's device was interrogated and the information was fully reviewed.  The device was reprogrammed to maximize longevity and to activate the st alert

## 2010-11-12 NOTE — Assessment & Plan Note (Signed)
Stable continue current meds 

## 2010-11-12 NOTE — Patient Instructions (Signed)
Your physician wants you to follow-up in: 6 months with Dr. Graciela Husbands. You will receive a reminder letter in the mail two months in advance. If you don't receive a letter, please call our office to schedule the follow-up appointment.  Remote monitoring is used to monitor your Pacemaker of ICD from home. This monitoring reduces the number of office visits required to check your device to one time per year. It allows [place] to keep an eye on the functioning of your device to ensure it is working properly. You are scheduled for a device check from home on 02/10/11. You may send your transmission at any time that day. If you have a wireless device, the transmission will be sent automatically. After your physician reviews your transmission, you will receive a postcard with your next transmission date.  Your physician recommends that you continue on your current medications as directed. Please refer to the Current Medication list given to you today.

## 2010-11-12 NOTE — Assessment & Plan Note (Signed)
Stable on current medication.  

## 2010-11-12 NOTE — Progress Notes (Signed)
  HPI  Derrick Lane is a 50 y.o. male Status post ICD for primary prevention.   His history of coronary artery disease with prior anterior MI in May 2011 treated with DES. Hospitalization c/b ARDS. EF on d/c 40-45%. March 2012 he developed acute congestive heart failure. Reassessment of his ejection fraction demonstrated that it was 10-15%. 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 He underwent ICD implantation December 2012  Past Medical History  Diagnosis Date  . HLD (hyperlipidemia)   . GERD (gastroesophageal reflux disease)   . Chronic systolic heart failure     EF 95-62%.   Marland Kitchen CAD (coronary artery disease)     s/p Anterior MI 5/11. LAD stent  . Chronic kidney disease, stage III (moderate)     No past surgical history on file.  Current Outpatient Prescriptions  Medication Sig Dispense Refill  . aspirin 81 MG tablet Take 81 mg by mouth daily.        . candesartan (ATACAND) 8 MG tablet Take 24 mg by mouth. Take 1 tablet in the morning and 2 tablets at night       . carvedilol (COREG) 12.5 MG tablet Take 12.5 mg by mouth 2 (two) times daily with a meal.        . cetirizine (ZYRTEC) 10 MG tablet Take 10 mg by mouth daily.        . clopidogrel (PLAVIX) 75 MG tablet Take 1 tablet (75 mg total) by mouth daily.  30 tablet  11  . furosemide (LASIX) 20 MG tablet Take 20 mg by mouth daily.       . nitroGLYCERIN (NITROSTAT) 0.4 MG SL tablet Place 1 tablet (0.4 mg total) under the tongue every 5 (five) minutes as needed for chest pain.  25 tablet  9  . rosuvastatin (CRESTOR) 40 MG tablet Take 40 mg by mouth daily.        Marland Kitchen spironolactone (ALDACTONE) 25 MG tablet Take 50 mg by mouth 2 (two) times daily.         Allergies  Allergen Reactions  . Ramipril     Review of Systems negative except from HPI and PMH  Physical Exam Well developed and well nourished in no acute distress HENT  normal E scleral and icterus clear Neck Supple JVP flat; carotids brisk and full Clear to ausculation Regular rate and rhythm, no murmurs gallops or rub Soft with active bowel sounds No clubbing cyanosis and edema Alert and oriented, grossly normal motor and sensory function Skin Warm and Dry  ECG  Assessment and  Plan

## 2010-12-02 ENCOUNTER — Ambulatory Visit (HOSPITAL_COMMUNITY)
Admission: RE | Admit: 2010-12-02 | Discharge: 2010-12-02 | Disposition: A | Discharge status: Home / Self Care | Payer: BC Managed Care – PPO | Source: Ambulatory Visit | Attending: Internal Medicine | Admitting: Internal Medicine

## 2010-12-02 VITALS — BP 100/78 | HR 66 | Wt 210.0 lb

## 2010-12-02 DIAGNOSIS — I5022 Chronic systolic (congestive) heart failure: Secondary | ICD-10-CM | POA: Insufficient documentation

## 2010-12-02 NOTE — Progress Notes (Signed)
Patient ID: Derrick Lane, male   DOB: February 19, 1960, 50 y.o.   MRN: [id number]  HPI:  Derrick Lane is a 50 y/o state [employer] with h/o CAD s/p anterior MI in May 2011 treated with DES. Hospitalization c/b ARDS. EF on d/c 40-45%. Has h/o angioedema on ACE-I.   Admitted in 3/12 with acute/chronic CHF. EF down to 10-15%. 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 Diuresed and meds titrated.  Right heart cardiac catheterization performed on March 30, 2010, showing essentially normal hemodynamics and preserved cardiac output and RA of 4, RV 33/1 (3), PA 40/17 (25), PCWP 12.  Cardiac output (Fick) 4.83 liters per minute with an index of 2.42 liters per minute per meter squared.   Echo 6/12. EF 20-25%. RV mild to moderately HK.  CPX 07/08/10: pVO2 25.6 (74%) slope 34 ME/MVV 67% RER 1.17 Normal HR and BP response.  Underwent LHC 08/03/2010: LAD 40% stent patent LCX 30-40% prox OM-1 stent patent RCA 40% EF 15%. Underwent ICD placement earlier this month - St. Jude dual chamber ICD on Aug 05, 2010.  Hydralazine stopped previously due to low BP.   He returns for follow up. Last visit his Atacand was titrated, he did not tolerate due to fatigue. He decreased Atacand 8 mg in am and 16 mg in pm.  Weight at home 202.He has not required any extra Lasix.  Denies SOB/PND/orthopnea. Now walking to play golf two times a week. Denies lower extremity edema. Plan to be evaluated for allergies due to lip edema. He has eliminated beef and pork.        ROS: All systems negative except as listed in HPI, PMH and Problem List.  Past Medical History  Diagnosis Date  . HLD (hyperlipidemia)   . GERD (gastroesophageal reflux disease)   . Chronic systolic heart failure     EF 40-98%.   Marland Kitchen CAD (coronary artery disease)     s/p Anterior MI 5/11. LAD stent  . Chronic kidney disease, stage III (moderate)   . Dual implantable cardiac defibrillator     St. Jude-Analyze ST study    Current Outpatient Prescriptions  Medication  Sig Dispense Refill  . aspirin 81 MG tablet Take 81 mg by mouth daily.        . candesartan (ATACAND) 8 MG tablet Take 24 mg by mouth. Take 1 tablet in the morning and 2 tablets at night       . carvedilol (COREG) 12.5 MG tablet Take 12.5 mg by mouth 2 (two) times daily with a meal.        . cetirizine (ZYRTEC) 10 MG tablet Take 10 mg by mouth daily.        . clopidogrel (PLAVIX) 75 MG tablet Take 1 tablet (75 mg total) by mouth daily.  30 tablet  11  . furosemide (LASIX) 20 MG tablet Take 20 mg by mouth daily.       . nitroGLYCERIN (NITROSTAT) 0.4 MG SL tablet Place 1 tablet (0.4 mg total) under the tongue every 5 (five) minutes as needed for chest pain.  25 tablet  9  . rosuvastatin (CRESTOR) 40 MG tablet Take 40 mg by mouth daily.        Marland Kitchen spironolactone (ALDACTONE) 25 MG tablet Take 50 mg by mouth 2 (two) times daily.          PHYSICAL EXAM: Filed Vitals:   12/02/10 1139  BP: 100/78  Pulse: 66  Wt 210 (207)   General:  Well appearing. No resp  difficulty HEENT: normal Neck: supple. JVP flat. Carotids 2+ bilaterally; no bruits. No lymphadenopathy or thryomegaly appreciated. Cor: PMI normal. Regular rate & rhythm. No rubs, murmurs.  Lungs: clear Abdomen: soft, nontender, nondistended. No hepatosplenomegaly. No bruits or masses. Good bowel sounds. Extremities: no cyanosis, clubbing, rash, edema Neuro: alert & orientedx3, cranial nerves grossly intact. Moves all 4 extremities w/o difficulty. Affect pleasant.    ASSESSMENT & PLAN:

## 2010-12-02 NOTE — Assessment & Plan Note (Addendum)
NYHA I. Volume status stable. Unable to tolerate increase Atacand. He will adjust Atacand back to 16 bid as he tolerates.Congratulated him for increasing exercise.  Follow up in 2-3 months.

## 2010-12-02 NOTE — Patient Instructions (Signed)
Follow in 2-3 months.   Weigh yourself EVERY morning after you go to the bathroom but before you eat or drink anything. Write this number down in a weight log/diary. If you gain 3 pounds overnight or 5 pounds in a week, call the heart failure clinic

## 2010-12-03 NOTE — Progress Notes (Signed)
Patient seen and examined with Amy Clegg, NP. We discussed all aspects of the encounter. I agree with the assessment and plan as stated above.   

## 2010-12-24 ENCOUNTER — Telehealth: Payer: Self-pay | Admitting: Internal Medicine

## 2010-12-24 NOTE — Telephone Encounter (Signed)
New msg Pt has a virus and he is concerned. Please call corrinne back to discuss if he needs an antibiotic

## 2010-12-24 NOTE — Telephone Encounter (Signed)
Complaining of runny nose, nasal congestion, slightly elevated temp.x 3-4 days.  She is requesting medication for this.  I recommended he see his pcp or go to urgent care.

## 2010-12-24 NOTE — Telephone Encounter (Signed)
N/A.  LMTC. 

## 2011-01-06 ENCOUNTER — Other Ambulatory Visit: Payer: Self-pay

## 2011-01-06 MED ORDER — ROSUVASTATIN CALCIUM 40 MG PO TABS: 40.0000 mg | ORAL_TABLET | Freq: Every day | ORAL | Status: DC

## 2011-01-31 ENCOUNTER — Other Ambulatory Visit: Payer: Self-pay

## 2011-01-31 MED ORDER — CARVEDILOL 12.5 MG PO TABS: 12.5000 mg | ORAL_TABLET | Freq: Two times a day (BID) | ORAL | Status: DC

## 2011-02-10 ENCOUNTER — Ambulatory Visit (INDEPENDENT_AMBULATORY_CARE_PROVIDER_SITE_OTHER): Payer: BC Managed Care – PPO | Admitting: *Deleted

## 2011-02-10 DIAGNOSIS — I429 Cardiomyopathy, unspecified: Secondary | ICD-10-CM

## 2011-02-10 DIAGNOSIS — Z95810 Presence of automatic (implantable) cardiac defibrillator: Secondary | ICD-10-CM

## 2011-02-14 ENCOUNTER — Encounter: Payer: Self-pay | Admitting: Internal Medicine

## 2011-02-14 LAB — REMOTE ICD DEVICE
AL AMPLITUDE: 4.5 mv
BAMS-0001: 180 {beats}/min
DEVICE MODEL ICD: 815371
HV IMPEDENCE: 43 Ohm
RV LEAD AMPLITUDE: 11.5 mv
TZAT-0004FASTVT: 8
TZAT-0004SLOWVT: 8
TZAT-0012FASTVT: 200 ms
TZAT-0013FASTVT: 2
TZAT-0013SLOWVT: 4
TZAT-0018FASTVT: NEGATIVE
TZAT-0019FASTVT: 7.5 V
TZON-0003FASTVT: 285 ms
TZON-0004FASTVT: 30
TZST-0001FASTVT: 3
TZST-0001FASTVT: 4
TZST-0001SLOWVT: 2
TZST-0001SLOWVT: 3
TZST-0001SLOWVT: 5
TZST-0003FASTVT: 800 V
TZST-0003FASTVT: 890 V
TZST-0003SLOWVT: 890 V

## 2011-02-18 NOTE — Progress Notes (Signed)
ICD remote 

## 2011-02-22 ENCOUNTER — Encounter: Payer: Self-pay | Admitting: *Deleted

## 2011-02-28 ENCOUNTER — Other Ambulatory Visit: Payer: Self-pay | Admitting: *Deleted

## 2011-02-28 MED ORDER — FUROSEMIDE 20 MG PO TABS: 20.0000 mg | ORAL_TABLET | Freq: Every day | ORAL | Status: DC

## 2011-03-15 ENCOUNTER — Ambulatory Visit (HOSPITAL_COMMUNITY)
Admission: RE | Admit: 2011-03-15 | Discharge: 2011-03-15 | Disposition: A | Discharge status: Home / Self Care | Payer: BC Managed Care – PPO | Source: Ambulatory Visit | Attending: Internal Medicine | Admitting: Internal Medicine

## 2011-03-15 VITALS — BP 106/70 | HR 51 | Ht 72.0 in | Wt 211.0 lb

## 2011-03-15 DIAGNOSIS — E785 Hyperlipidemia, unspecified: Secondary | ICD-10-CM | POA: Insufficient documentation

## 2011-03-15 DIAGNOSIS — K219 Gastro-esophageal reflux disease without esophagitis: Secondary | ICD-10-CM | POA: Insufficient documentation

## 2011-03-15 DIAGNOSIS — Z8249 Family history of ischemic heart disease and other diseases of the circulatory system: Secondary | ICD-10-CM | POA: Insufficient documentation

## 2011-03-15 DIAGNOSIS — I252 Old myocardial infarction: Secondary | ICD-10-CM | POA: Insufficient documentation

## 2011-03-15 DIAGNOSIS — Z95810 Presence of automatic (implantable) cardiac defibrillator: Secondary | ICD-10-CM | POA: Insufficient documentation

## 2011-03-15 DIAGNOSIS — Z9861 Coronary angioplasty status: Secondary | ICD-10-CM | POA: Insufficient documentation

## 2011-03-15 DIAGNOSIS — I5022 Chronic systolic (congestive) heart failure: Secondary | ICD-10-CM | POA: Insufficient documentation

## 2011-03-15 DIAGNOSIS — Z7982 Long term (current) use of aspirin: Secondary | ICD-10-CM | POA: Insufficient documentation

## 2011-03-15 DIAGNOSIS — N183 Chronic kidney disease, stage 3 unspecified: Secondary | ICD-10-CM | POA: Insufficient documentation

## 2011-03-15 DIAGNOSIS — I509 Heart failure, unspecified: Secondary | ICD-10-CM | POA: Insufficient documentation

## 2011-03-15 DIAGNOSIS — I2510 Atherosclerotic heart disease of native coronary artery without angina pectoris: Secondary | ICD-10-CM | POA: Insufficient documentation

## 2011-03-15 DIAGNOSIS — I5023 Acute on chronic systolic (congestive) heart failure: Secondary | ICD-10-CM | POA: Insufficient documentation

## 2011-03-15 NOTE — Progress Notes (Signed)
Patient ID: Derrick Lane, male   DOB: 1960/04/23, 51 y.o.   MRN: [id number]  HPI:  Derrick Lane is a 51 y/o [employer] with h/o CAD s/p anterior MI in May 2011 treated with DES. Hospitalization c/b ARDS. EF on d/c 40-45%. Has h/o angioedema on ACE-I.  Does not tolerate hydralazine due to hypotension.   Admitted in 3/12 with acute/chronic CHF. EF down to 10-15%.   RHC 03/30/10 RA of 4, RV 33/1 (3), PA 40/17 (25), PCWP 12.  CO (Fick) 4.83 CI 2.42   Echo 6/12. EF 20-25%. RV mild to moderately HK.  CPX 07/08/10: pVO2 25.6 (74%) slope 34 ME/MVV 67% RER 1.17 Normal HR and BP response.   LHC 08/03/2010: LAD 40% stent patent LCX 30-40% prox OM-1 stent patent RCA 40% EF 15%. Underwent ICD placement - St. Jude dual chamber ICD on Aug 05, 2010.  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   He returns for follow up. He has not ben able to increase Atacand.  Weight at home 202-205.He has not required any extra Lasix.  Denies SOB/PND/orthopnea. Complains of pin point pain L shoulder that resolves with pressure. Now walking to play golf two-three times a week. He is taking his time walking up hills while golfing.   Denies lower extremity edema. Continues to work full time with [employer].        ROS: All systems negative except as listed in HPI, PMH and Problem List.  Past Medical History  Diagnosis Date  . HLD (hyperlipidemia)   . GERD (gastroesophageal reflux disease)   . Chronic systolic heart failure     EF 40-98%.   Marland Kitchen CAD (coronary artery disease)     s/p Anterior MI 5/11. LAD stent  . Chronic kidney disease, stage III (moderate)   . Dual implantable cardiac defibrillator     St. Jude-Analyze ST study    Current Outpatient Prescriptions  Medication Sig Dispense Refill  . aspirin 81 MG tablet Take 81 mg by mouth daily.        . candesartan (ATACAND) 8 MG tablet Take 24 mg by mouth. Take 1 tablet in the morning and 2 tablets at night       .  carvedilol (COREG) 12.5 MG tablet Take 1 tablet (12.5 mg total) by mouth 2 (two) times daily with a meal.  60 tablet  5  . cetirizine (ZYRTEC) 10 MG tablet Take 10 mg by mouth daily.        . clopidogrel (PLAVIX) 75 MG tablet Take 1 tablet (75 mg total) by mouth daily.  30 tablet  11  . furosemide (LASIX) 20 MG tablet Take 1 tablet (20 mg total) by mouth daily.  30 tablet  5  . nitroGLYCERIN (NITROSTAT) 0.4 MG SL tablet Place 1 tablet (0.4 mg total) under the tongue every 5 (five) minutes as needed for chest pain.  25 tablet  9  . rosuvastatin (CRESTOR) 40 MG tablet Take 1 tablet (40 mg total) by mouth daily.  30 tablet  5  . spironolactone (ALDACTONE) 25 MG tablet Take 25 mg by mouth daily.          PHYSICAL EXAM: Filed Vitals:   03/15/11 1256  BP: 106/70  Pulse: 51  Wt 211 (210)   General:  Well appearing. No resp difficulty HEENT: normal Neck: supple. JVP flat. Carotids 2+ bilaterally; no bruits. No lymphadenopathy or thryomegaly appreciated. Cor: PMI normal. Regular rate &  rhythm. No rubs, murmurs.  Lungs: clear Abdomen: soft, nontender, nondistended. No hepatosplenomegaly. No bruits or masses. Good bowel sounds. Extremities: no cyanosis, clubbing, rash, edema Neuro: alert & orientedx3, cranial nerves grossly intact. Moves all 4 extremities w/o difficulty. Affect pleasant.    ASSESSMENT & PLAN:

## 2011-03-15 NOTE — Assessment & Plan Note (Signed)
No evidence of ischemia. Continue current regimen.   

## 2011-03-15 NOTE — Patient Instructions (Addendum)
   Follow up in 3 months with an ECHO  Do the following things EVERYDAY: 1) Weigh yourself in the morning before breakfast. Write it down and keep it in a log. 2) Take your medicines as prescribed 3) Eat low salt foods--Limit salt (sodium) to 2000mg  per day.  4) Stay as active as you can everyday

## 2011-03-15 NOTE — Assessment & Plan Note (Addendum)
NYHA II. Volume status stable. Will repeat ECHO. Follow up in 4 months.   Patient seen and examined with Tonye Becket, NP. We discussed all aspects of the encounter. I agree with the assessment and plan as stated above. Looks great on exam. NYHA II. Unable to tolerate further titration of meds. Will see back in 3 months with repeat echo.

## 2011-03-17 ENCOUNTER — Encounter (HOSPITAL_COMMUNITY): Payer: BC Managed Care – PPO

## 2011-03-18 ENCOUNTER — Encounter: Payer: Self-pay | Admitting: Internal Medicine

## 2011-05-04 ENCOUNTER — Encounter: Payer: Self-pay | Admitting: Internal Medicine

## 2011-05-04 ENCOUNTER — Ambulatory Visit (INDEPENDENT_AMBULATORY_CARE_PROVIDER_SITE_OTHER): Payer: BC Managed Care – PPO | Admitting: Internal Medicine

## 2011-05-04 VITALS — BP 108/74 | HR 60 | Ht 72.0 in | Wt 210.1 lb

## 2011-05-04 DIAGNOSIS — I5022 Chronic systolic (congestive) heart failure: Secondary | ICD-10-CM

## 2011-05-04 DIAGNOSIS — I2510 Atherosclerotic heart disease of native coronary artery without angina pectoris: Secondary | ICD-10-CM

## 2011-05-04 DIAGNOSIS — Z95810 Presence of automatic (implantable) cardiac defibrillator: Secondary | ICD-10-CM

## 2011-05-04 DIAGNOSIS — I429 Cardiomyopathy, unspecified: Secondary | ICD-10-CM

## 2011-05-04 LAB — ICD DEVICE OBSERVATION
AL IMPEDENCE ICD: 450 Ohm
AL THRESHOLD: 0.75 V
ATRIAL PACING ICD: 0.13 pct
DEV-0020ICD: NEGATIVE
RV LEAD IMPEDENCE ICD: 362.5 Ohm
TOT-0006: 20120802000000
TOT-0007: 1
TOT-0008: 0
TOT-0009: 1
TZAT-0004SLOWVT: 8
TZAT-0012SLOWVT: 200 ms
TZAT-0013FASTVT: 2
TZAT-0013SLOWVT: 4
TZAT-0018FASTVT: NEGATIVE
TZON-0003FASTVT: 285 ms
TZON-0004FASTVT: 30
TZON-0005FASTVT: 6
TZON-0005SLOWVT: 6
TZST-0001FASTVT: 2
TZST-0001SLOWVT: 5
TZST-0003FASTVT: 800 V
TZST-0003FASTVT: 890 V
TZST-0003FASTVT: 890 V
TZST-0003SLOWVT: 800 V

## 2011-05-04 NOTE — Progress Notes (Signed)
  HPI  Derrick Lane is a 51 y.o. male Status post ICD for primary prevention.  The patient denies chest pain, shortness of breath, nocturnal dyspnea, orthopnea or peripheral edema.  There have been no palpitations, lightheadedness or syncope.   His history of coronary artery disease with prior anterior MI in May 2011 treated with DES. Hospitalization c/b ARDS. EF on d/c 40-45%. March 2012 he developed acute congestive heart failure. Reassessment of his ejection fraction demonstrated that it was 10-15%. 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 He underwent ICD implantation December 2012   Past Medical History  Diagnosis Date  . HLD (hyperlipidemia)   . GERD (gastroesophageal reflux disease)   . Chronic systolic heart failure     EF 16-10%.   Marland Kitchen CAD (coronary artery disease)     s/p Anterior MI 5/11. LAD stent  . Chronic kidney disease, stage III (moderate)   . Dual implantable cardiac defibrillator     St. Jude-Analyze ST study    No past surgical history on file.  Current Outpatient Prescriptions  Medication Sig Dispense Refill  . aspirin 81 MG tablet Take 81 mg by mouth daily.        . candesartan (ATACAND) 8 MG tablet ONE TABLET IN THE MORNING AND 2 TABLETS AT NIGHT      . carvedilol (COREG) 12.5 MG tablet Take 1 tablet (12.5 mg total) by mouth 2 (two) times daily with a meal.  60 tablet  5  . cetirizine (ZYRTEC) 10 MG tablet Take 10 mg by mouth daily.        . clopidogrel (PLAVIX) 75 MG tablet Take 1 tablet (75 mg total) by mouth daily.  30 tablet  11  . furosemide (LASIX) 20 MG tablet Take 1 tablet (20 mg total) by mouth daily.  30 tablet  5  . nitroGLYCERIN (NITROSTAT) 0.4 MG SL tablet Place 1 tablet (0.4 mg total) under the tongue every 5 (five) minutes as needed for chest pain.  25 tablet  9  . rosuvastatin (CRESTOR) 40 MG tablet Take 1 tablet (40 mg total) by mouth daily.   30 tablet  5  . spironolactone (ALDACTONE) 25 MG tablet Take 25 mg by mouth daily.         Allergies  Allergen Reactions  . Beef-Derived Products   . Pork-Derived Products   . Ramipril     Possible lip swelling but may have been attributed to other allergy    Review of Systems negative except from HPI and PMH  Physical Exam BP 108/74  Pulse 60  Ht 6' (1.829 m)  Wt 210 lb 1.9 oz (95.31 kg)  BMI 28.50 kg/m2 Well developed and well nourished in no acute distress HENT normal E scleral and icterus clear Neck Supple JVP flat; carotids brisk and full Clear to ausculation Regular rate and rhythm, no murmurs gallops or rub Soft with active bowel sounds No clubbing cyanosis none Edema Alert and oriented, grossly normal motor and sensory function Skin Warm and Dry   Assessment and  Plan

## 2011-05-04 NOTE — Assessment & Plan Note (Signed)
Continue plavix??indefiniite  With anterior MI

## 2011-05-04 NOTE — Assessment & Plan Note (Signed)
The patient's device was interrogated.  The information was reviewed. No changes were made in the programming.    

## 2011-05-04 NOTE — Patient Instructions (Addendum)
Remote monitoring is used to monitor your Pacemaker of ICD from home. This monitoring reduces the number of office visits required to check your device to one time per year. It allows [place] to keep an eye on the functioning of your device to ensure it is working properly. You are scheduled for a device check from home on August 11, 2011. You may send your transmission at any time that day. If you have a wireless device, the transmission will be sent automatically. After your physician reviews your transmission, you will receive a postcard with your next transmission date.  Your physician wants you to follow-up in: 1 year with Dr. Graciela Husbands. You will receive a reminder letter in the mail two months in advance. If you don't receive a letter, please call our office to schedule the follow-up appointment.  Your physician recommends that you continue on your current medications as directed. Please refer to the Current Medication list given to you today.

## 2011-05-04 NOTE — Assessment & Plan Note (Signed)
Stable

## 2011-05-24 ENCOUNTER — Other Ambulatory Visit: Payer: Self-pay | Admitting: Internal Medicine

## 2011-05-24 ENCOUNTER — Other Ambulatory Visit (HOSPITAL_COMMUNITY): Payer: Self-pay | Admitting: Internal Medicine

## 2011-05-24 MED ORDER — SPIRONOLACTONE 25 MG PO TABS: 25.0000 mg | ORAL_TABLET | Freq: Every day | ORAL | Status: DC

## 2011-06-06 ENCOUNTER — Other Ambulatory Visit: Payer: Self-pay | Admitting: Internal Medicine

## 2011-06-06 MED ORDER — CLOPIDOGREL BISULFATE 75 MG PO TABS: 75.0000 mg | ORAL_TABLET | Freq: Every day | ORAL | Status: DC

## 2011-06-13 ENCOUNTER — Ambulatory Visit (HOSPITAL_COMMUNITY)
Admission: RE | Admit: 2011-06-13 | Discharge: 2011-06-13 | Disposition: A | Discharge status: Home / Self Care | Payer: BC Managed Care – PPO | Source: Ambulatory Visit | Attending: Adult Health | Admitting: Adult Health

## 2011-06-13 ENCOUNTER — Ambulatory Visit (HOSPITAL_COMMUNITY)
Admission: RE | Admit: 2011-06-13 | Discharge: 2011-06-13 | Disposition: A | Discharge status: Home / Self Care | Payer: BC Managed Care – PPO | Source: Ambulatory Visit | Attending: Internal Medicine | Admitting: Internal Medicine

## 2011-06-13 VITALS — BP 96/60 | HR 55 | Wt 213.0 lb

## 2011-06-13 DIAGNOSIS — I517 Cardiomegaly: Secondary | ICD-10-CM | POA: Insufficient documentation

## 2011-06-13 DIAGNOSIS — I5022 Chronic systolic (congestive) heart failure: Secondary | ICD-10-CM

## 2011-06-13 DIAGNOSIS — K219 Gastro-esophageal reflux disease without esophagitis: Secondary | ICD-10-CM | POA: Insufficient documentation

## 2011-06-13 DIAGNOSIS — Z95810 Presence of automatic (implantable) cardiac defibrillator: Secondary | ICD-10-CM | POA: Insufficient documentation

## 2011-06-13 DIAGNOSIS — E785 Hyperlipidemia, unspecified: Secondary | ICD-10-CM | POA: Insufficient documentation

## 2011-06-13 DIAGNOSIS — I252 Old myocardial infarction: Secondary | ICD-10-CM | POA: Insufficient documentation

## 2011-06-13 DIAGNOSIS — I2510 Atherosclerotic heart disease of native coronary artery without angina pectoris: Secondary | ICD-10-CM | POA: Insufficient documentation

## 2011-06-13 DIAGNOSIS — N183 Chronic kidney disease, stage 3 unspecified: Secondary | ICD-10-CM | POA: Insufficient documentation

## 2011-06-13 DIAGNOSIS — I509 Heart failure, unspecified: Secondary | ICD-10-CM | POA: Insufficient documentation

## 2011-06-13 DIAGNOSIS — Z7982 Long term (current) use of aspirin: Secondary | ICD-10-CM | POA: Insufficient documentation

## 2011-06-13 MED ORDER — EPLERENONE 25 MG PO TABS: 25.0000 mg | ORAL_TABLET | Freq: Every day | ORAL | Status: DC

## 2011-06-13 NOTE — Patient Instructions (Addendum)
Follow up in 4 months  Stop Spironolactone  Take INSPRA 25 mg daily  Do the following things EVERYDAY: 1) Weigh yourself in the morning before breakfast. Write it down and keep it in a log. 2) Take your medicines as prescribed 3) Eat low salt foods--Limit salt (sodium) to 2000 mg per day.  4) Stay as active as you can everyday 5) Limit all fluids for the day to less than 2 liters

## 2011-06-13 NOTE — Progress Notes (Signed)
*  PRELIMINARY RESULTS* Echocardiogram 2D Echocardiogram has been performed.  Derrick Lane [facility] 06/13/2011, 10:38 AM

## 2011-06-13 NOTE — Assessment & Plan Note (Addendum)
He is doing remarkably well. ECHO reviewed and discussed. Volume status stable despite 3 pound weight gain. EF improved now 30% from 10-15%. Unable to titrate Atacand due to fatigue. Stop Spironolactone due to breast tenderness. Start Eplerenone 25 mg daily. Discussed recommendations with his wife. He will follow up in 4 months.  Patient seen and examined with Tonye Becket, NP. We discussed all aspects of the encounter. I agree with the assessment and plan as stated above. I reviewed echo in clinic today personally and reviewed images with him. EF improved now 30%. Med titration limited by orthostasis. Will change spiro to eplerenone and change candesartan to generic. Discussed with his wife by phone

## 2011-06-13 NOTE — Progress Notes (Signed)
Patient ID: Derrick Lane, male   DOB: 08-19-1960, 51 y.o.   MRN: [id number]  HPI:  Derrick Lane is a 51 y/o with h/o CAD s/p anterior MI in May 2011 treated with DES. Hospitalization c/b ARDS. EF on d/c 40-45%. Has h/o angioedema on ACE-I.  Does not tolerate hydralazine due to hypotension. (retried state trooper  Admitted in 3/12 with acute/chronic CHF. EF down to 10-15%.   RHC 03/30/10 RA of 4, RV 33/1 (3), PA 40/17 (25), PCWP 12.  CO (Fick) 4.83 CI 2.42   Echo 6/12. EF 20-25%. RV mild to moderately HK.  CPX 07/08/10: pVO2 25.6 (74%) slope 34 ME/MVV 67% RER 1.17 Normal HR and BP response.   LHC 08/03/2010: LAD 40% stent patent LCX 30-40% prox OM-1 stent patent RCA 40% EF 15%. Underwent ICD placement - St. Jude dual chamber ICD on Aug 05, 2010.  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   06/13/11 ECHO 30%  He returns for follow up. Unable to increase Atacand.  Complains of breast tenderness. Denies SOB/PND/Orthopnea. Mild dyspnea going up stairs in [place]. Weight at home 205-207 pounds. He has not required any extra Lasix. He has been on vacation and liberalized his diet (eating stir fry). He has not been exercising.  Complaint with medications.       ROS: All systems negative except as listed in HPI, PMH and Problem List.  Past Medical History  Diagnosis Date  . HLD (hyperlipidemia)   . GERD (gastroesophageal reflux disease)   . Chronic systolic heart failure     EF 32-44%.   Marland Kitchen CAD (coronary artery disease)     s/p Anterior MI 5/11. LAD stent  . Chronic kidney disease, stage III (moderate)   . Dual implantable cardiac defibrillator     St. Jude-Analyze ST study    Current Outpatient Prescriptions  Medication Sig Dispense Refill  . aspirin 81 MG tablet Take 81 mg by mouth daily.        . candesartan (ATACAND) 8 MG tablet ONE TABLET IN THE MORNING AND 2 TABLETS AT NIGHT      . carvedilol (COREG) 12.5 MG tablet Take 1 tablet (12.5 mg total)  by mouth 2 (two) times daily with a meal.  60 tablet  5  . cetirizine (ZYRTEC) 10 MG tablet Take 10 mg by mouth daily.        . clopidogrel (PLAVIX) 75 MG tablet Take 1 tablet (75 mg total) by mouth daily.  30 tablet  11  . furosemide (LASIX) 20 MG tablet Take 1 tablet (20 mg total) by mouth daily.  30 tablet  5  . nitroGLYCERIN (NITROSTAT) 0.4 MG SL tablet Place 1 tablet (0.4 mg total) under the tongue every 5 (five) minutes as needed for chest pain.  25 tablet  9  . rosuvastatin (CRESTOR) 40 MG tablet Take 1 tablet (40 mg total) by mouth daily.  30 tablet  5  . spironolactone (ALDACTONE) 25 MG tablet Take 1 tablet (25 mg total) by mouth daily.  30 tablet  5     PHYSICAL EXAM: Filed Vitals:   06/13/11 1106  BP: 96/60  Pulse: 55  Wt 213  (210)   General:  Well appearing. No resp difficulty HEENT: normal Neck: supple. JVP flat. Carotids 2+ bilaterally; no bruits. No lymphadenopathy or thryomegaly appreciated. Cor: PMI normal. Regular rate & rhythm. No rubs, murmurs.  Lungs: clear Abdomen: soft, nontender, nondistended. No hepatosplenomegaly. No bruits  or masses. Good bowel sounds. Extremities: no cyanosis, clubbing, rash, edema Neuro: alert & orientedx3, cranial nerves grossly intact. Moves all 4 extremities w/o difficulty. Affect pleasant.    ASSESSMENT & PLAN:

## 2011-06-14 MED ORDER — FENTANYL CITRATE 0.05 MG/ML IJ SOLN
INTRAMUSCULAR | Status: AC
  Filled 2011-06-14: qty 2

## 2011-06-14 NOTE — Assessment & Plan Note (Signed)
Very stable. No evidence of ischemia. Continue current regimen.

## 2011-06-21 ENCOUNTER — Other Ambulatory Visit (HOSPITAL_COMMUNITY): Payer: Self-pay | Admitting: *Deleted

## 2011-06-21 MED ORDER — CANDESARTAN CILEXETIL 8 MG PO TABS: ORAL_TABLET | ORAL | Status: DC

## 2011-07-18 ENCOUNTER — Other Ambulatory Visit: Payer: Self-pay | Admitting: *Deleted

## 2011-07-18 MED ORDER — CARVEDILOL 12.5 MG PO TABS: 12.5000 mg | ORAL_TABLET | Freq: Two times a day (BID) | ORAL | Status: DC

## 2011-08-09 ENCOUNTER — Other Ambulatory Visit (HOSPITAL_COMMUNITY): Payer: Self-pay | Admitting: Internal Medicine

## 2011-08-10 NOTE — Telephone Encounter (Signed)
Fax Received. Refill Completed. Derrick Lane (R.M.A)   

## 2011-08-11 ENCOUNTER — Ambulatory Visit (INDEPENDENT_AMBULATORY_CARE_PROVIDER_SITE_OTHER): Payer: BC Managed Care – PPO | Admitting: *Deleted

## 2011-08-11 ENCOUNTER — Encounter: Payer: Self-pay | Admitting: *Deleted

## 2011-08-11 DIAGNOSIS — I5022 Chronic systolic (congestive) heart failure: Secondary | ICD-10-CM

## 2011-08-11 DIAGNOSIS — Z95810 Presence of automatic (implantable) cardiac defibrillator: Secondary | ICD-10-CM

## 2011-08-27 IMAGING — CR DG CHEST 2V
2 series · 2 of 2 positions shown · non-contrast
Comparison: 05/07/2009

CLINICAL DATA: Preop AICD placement

CHEST - 2 VIEW

[view not recorded (1 of 2)]
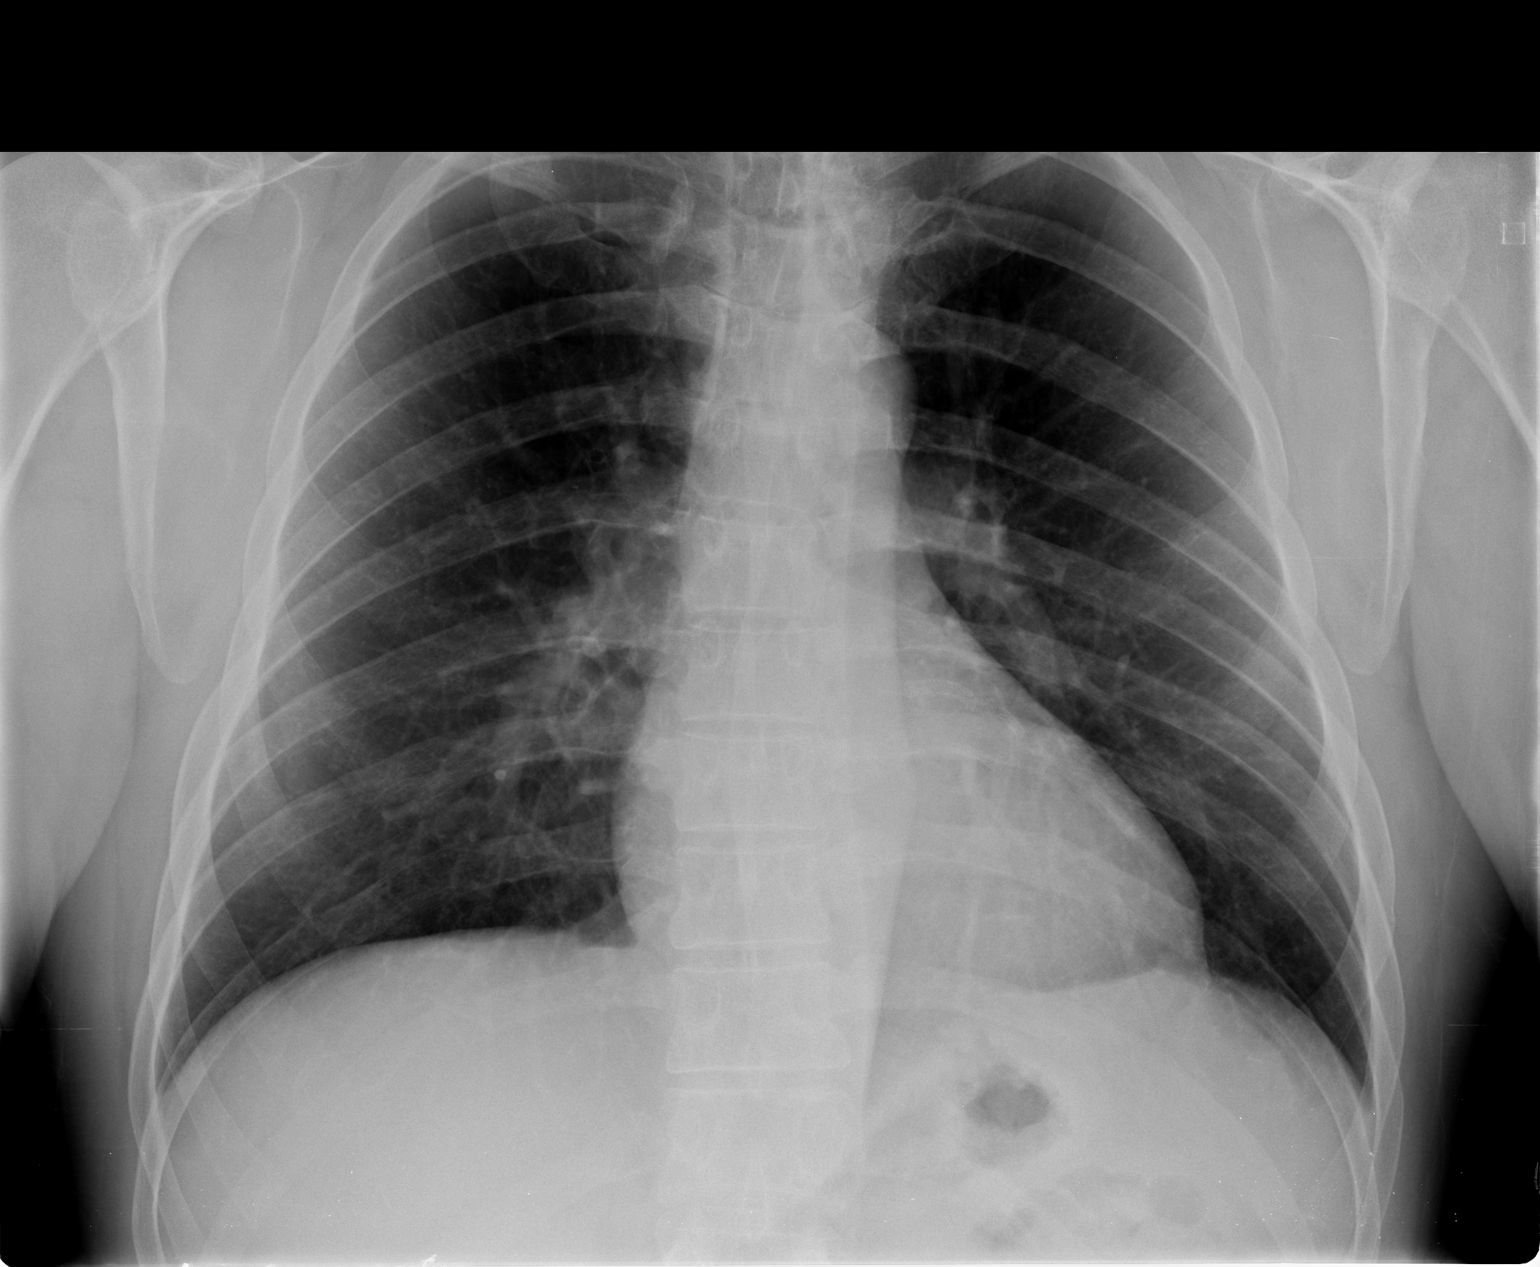

[view not recorded (2 of 2)]
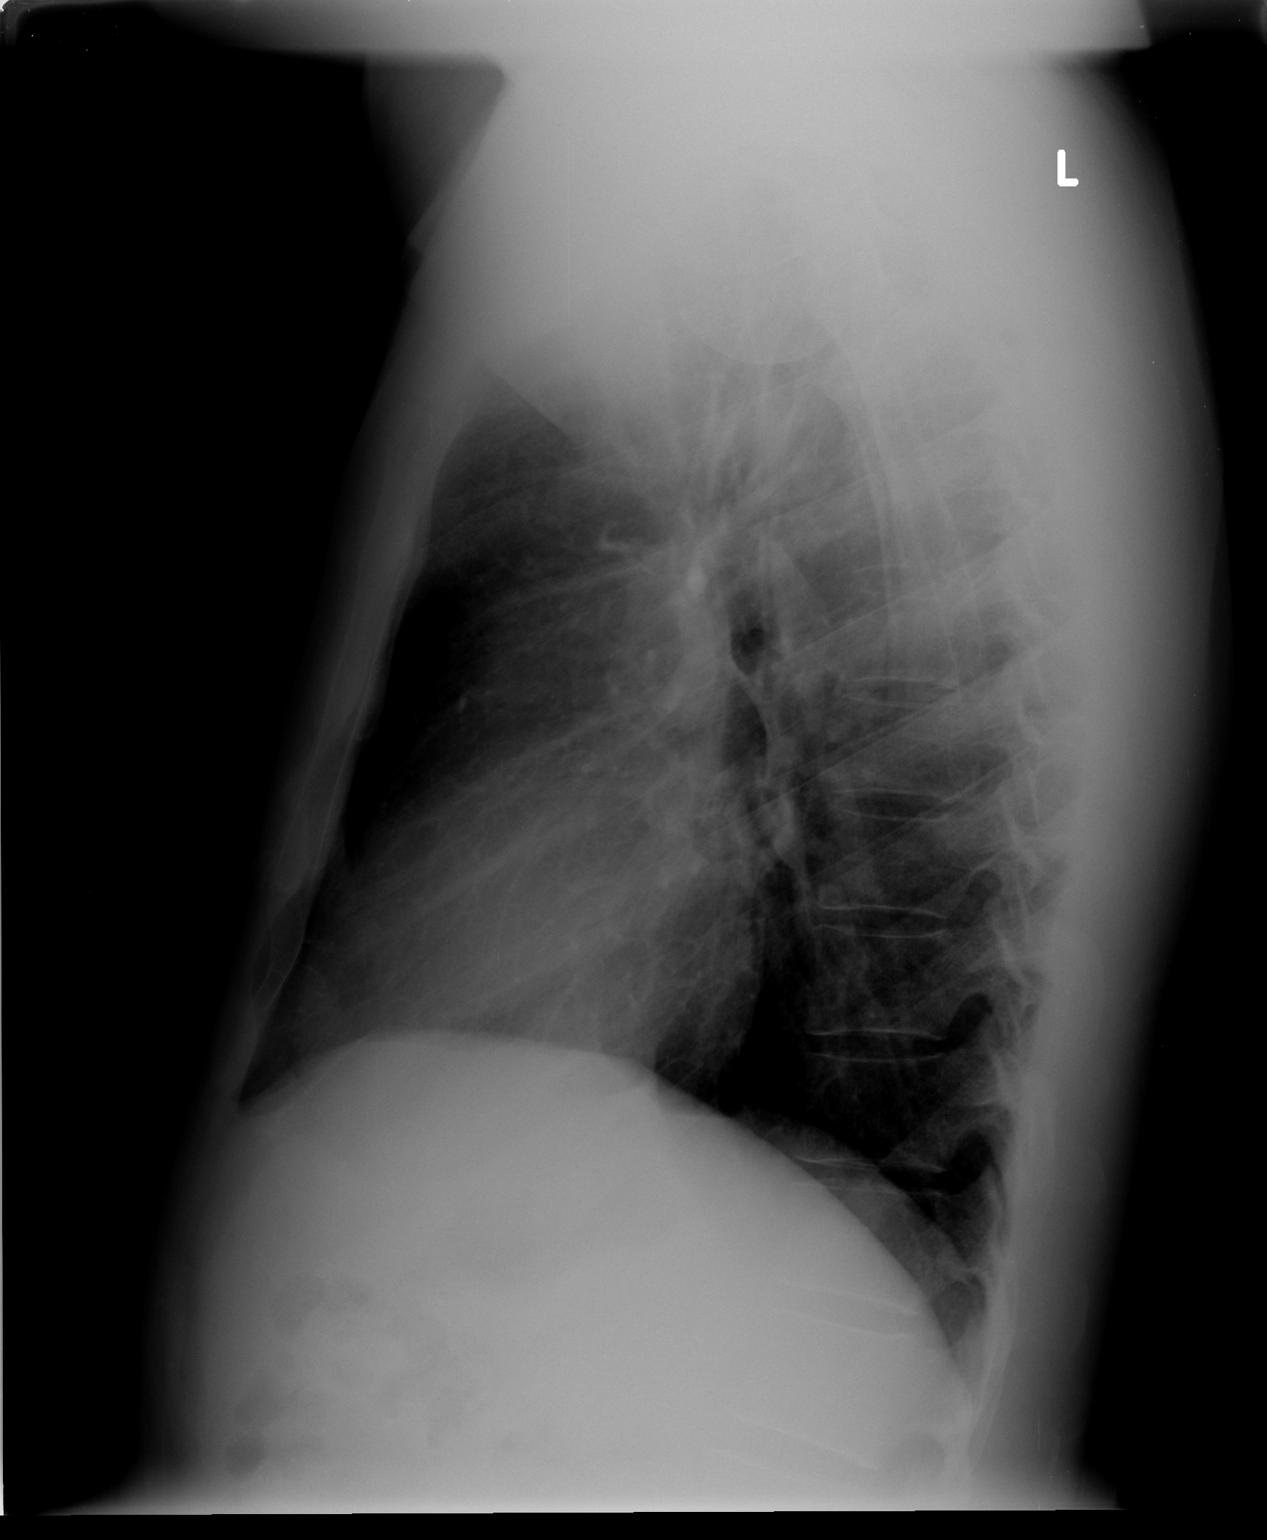

[2 of 2 positions shown; findings below may reference images not displayed]

FINDINGS: Heart size is upper normal.  Vascularity is normal.
Negative for heart failure.  Lungs are clear without infiltrate or
effusion.
IMPRESSION: No active cardiopulmonary disease.

## 2011-08-28 IMAGING — CR DG CHEST 2V
2 series · 2 of 2 positions shown · non-contrast
Comparison: 08/05/2010

CLINICAL DATA: ICD placement.

CHEST - 2 VIEW

[w chest pa]
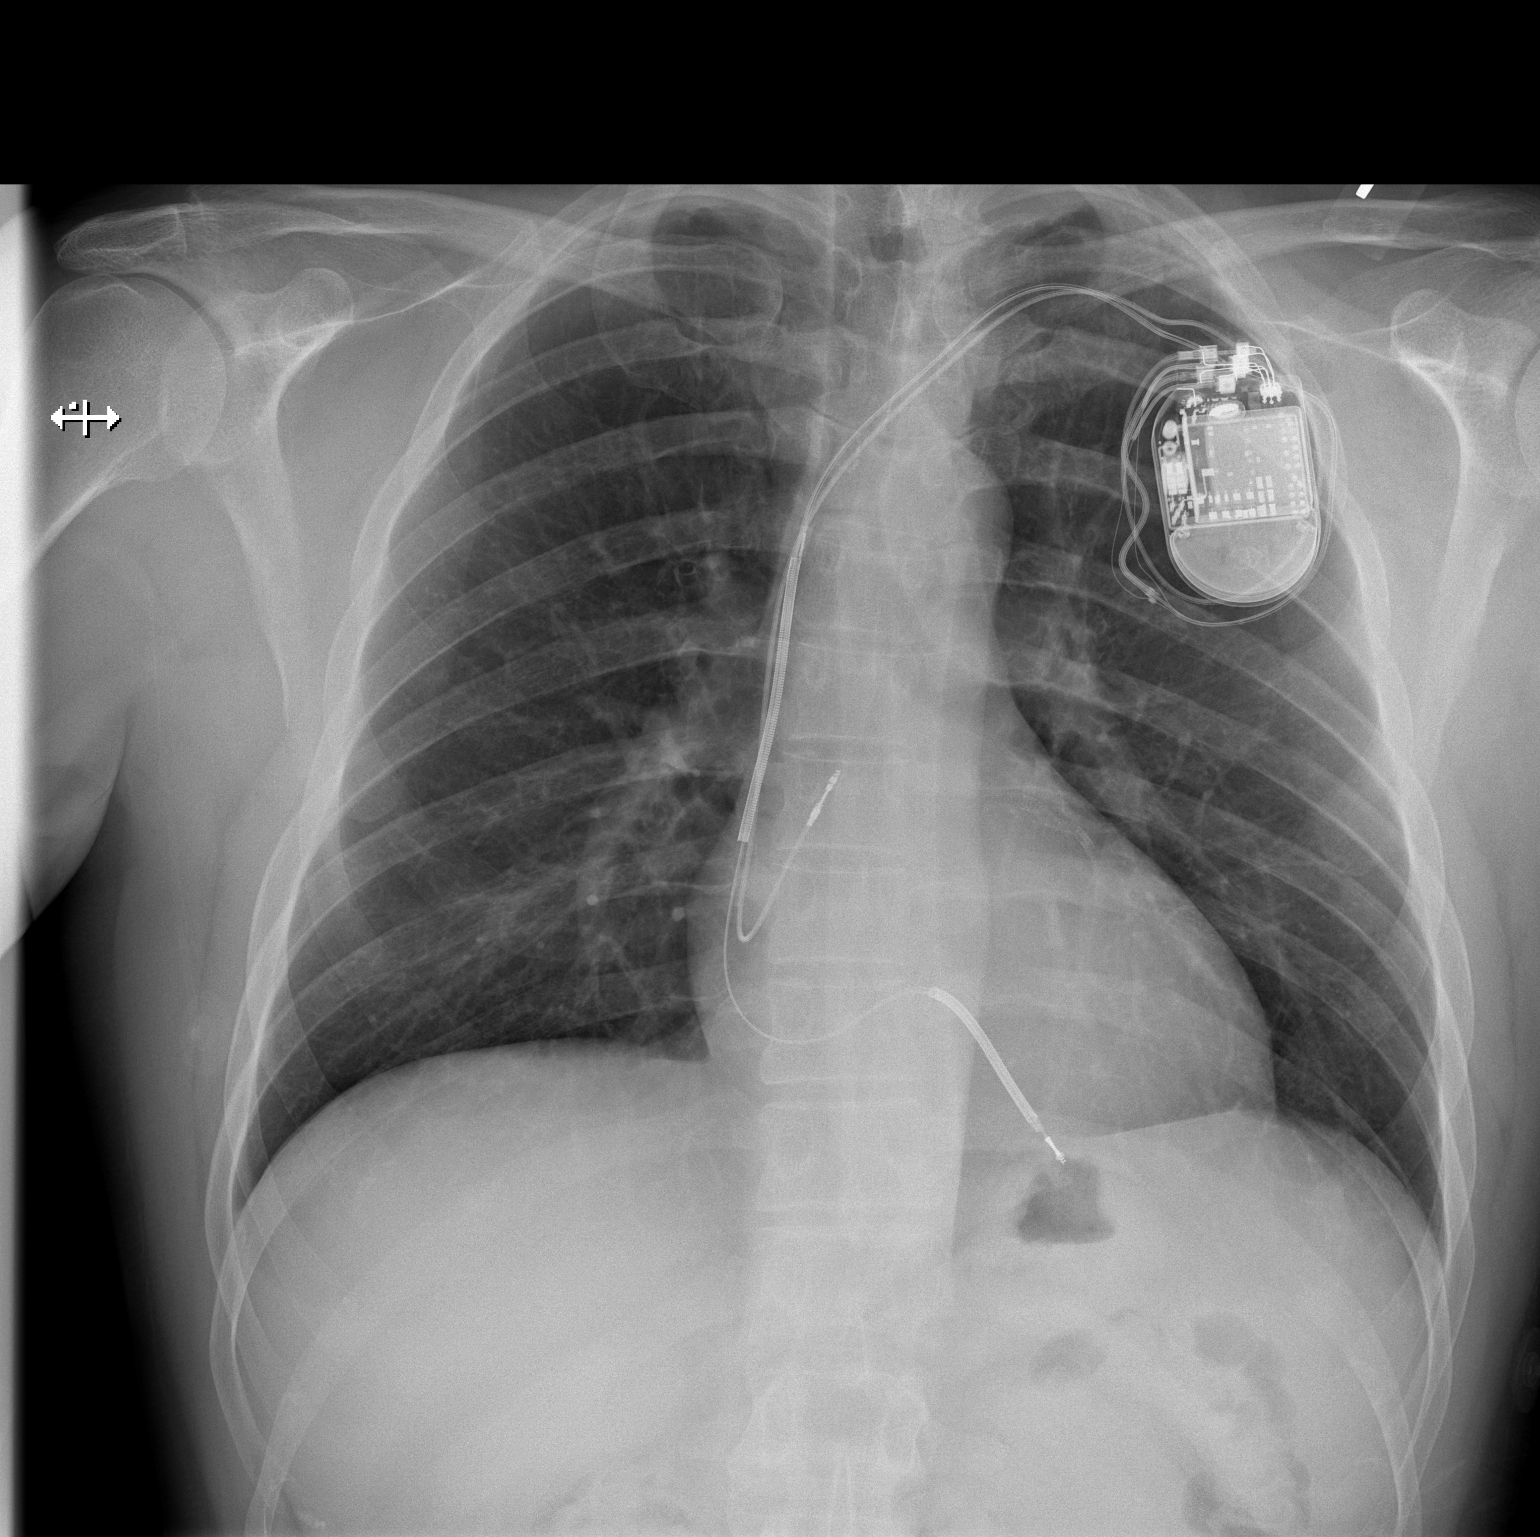

[w chest lat]
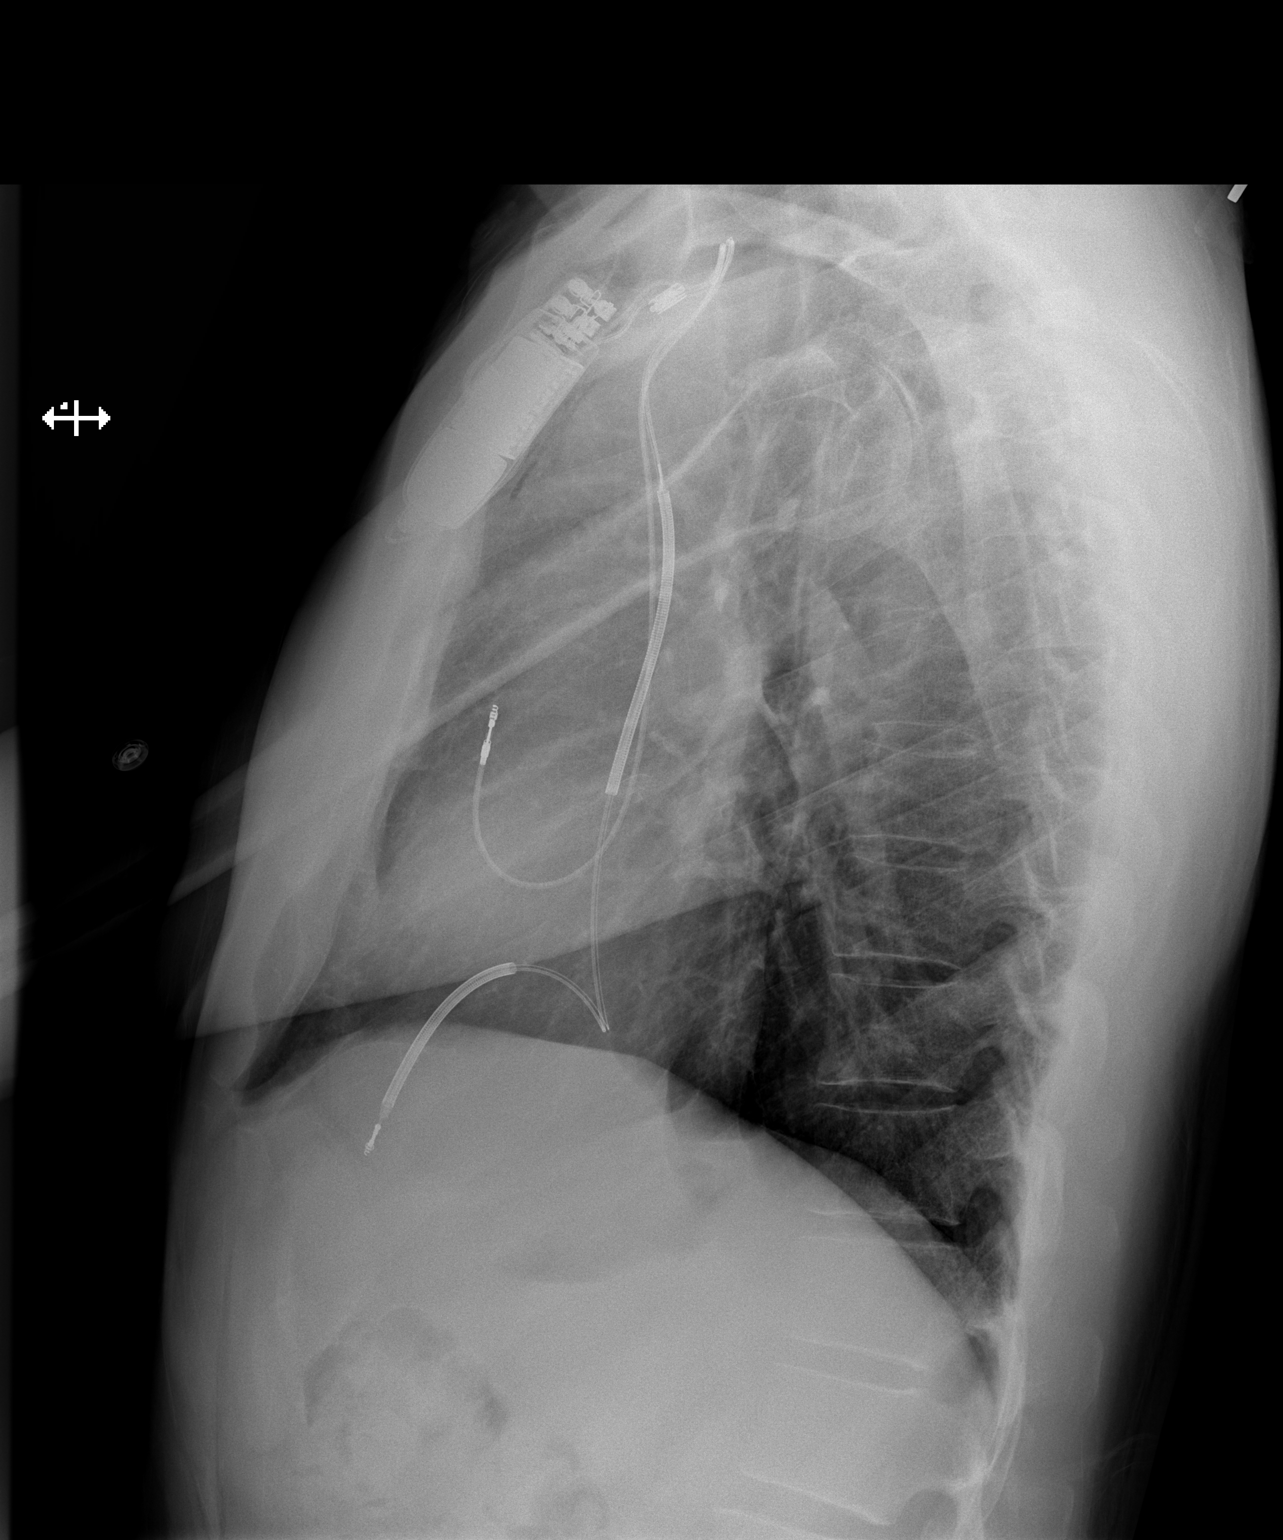

[2 of 2 positions shown; findings below may reference images not displayed]

FINDINGS: Interval placement of left chest wall AICD.  No
pneumothorax is seen.  The leads project in appropriate position.
The lungs are clear.  The heart is normal in size.  Coronary artery
stent is again visualized.  The upper abdomen and osseous
structures are unchanged.
IMPRESSION: Uncomplicated placement of a left chest wall AICD.

## 2011-09-10 ENCOUNTER — Other Ambulatory Visit (HOSPITAL_COMMUNITY): Payer: Self-pay | Admitting: Internal Medicine

## 2011-09-10 DIAGNOSIS — I5022 Chronic systolic (congestive) heart failure: Secondary | ICD-10-CM

## 2011-09-14 ENCOUNTER — Other Ambulatory Visit (HOSPITAL_COMMUNITY): Payer: Self-pay | Admitting: Internal Medicine

## 2011-09-28 ENCOUNTER — Other Ambulatory Visit: Payer: Self-pay | Admitting: Internal Medicine

## 2011-11-01 ENCOUNTER — Other Ambulatory Visit: Payer: Self-pay | Admitting: Internal Medicine

## 2011-11-01 DIAGNOSIS — I509 Heart failure, unspecified: Secondary | ICD-10-CM

## 2011-11-24 ENCOUNTER — Ambulatory Visit (INDEPENDENT_AMBULATORY_CARE_PROVIDER_SITE_OTHER): Payer: BC Managed Care – PPO | Admitting: *Deleted

## 2011-11-24 ENCOUNTER — Encounter: Payer: Self-pay | Admitting: Internal Medicine

## 2011-11-24 DIAGNOSIS — I429 Cardiomyopathy, unspecified: Secondary | ICD-10-CM

## 2011-11-24 DIAGNOSIS — I5022 Chronic systolic (congestive) heart failure: Secondary | ICD-10-CM

## 2011-11-24 LAB — ICD DEVICE OBSERVATION
AL THRESHOLD: 0.75 V
ATRIAL PACING ICD: 0.25 pct
BAMS-0001: 180 {beats}/min
DEV-0020ICD: NEGATIVE
DEVICE MODEL ICD: 815371
MODE SWITCH EPISODES: 0
PACEART VT: 0
RV LEAD THRESHOLD: 1.25 V
TOT-0006: 20120802000000
TZAT-0001SLOWVT: 1
TZAT-0004FASTVT: 8
TZAT-0018FASTVT: NEGATIVE
TZON-0003FASTVT: 285 ms
TZON-0010FASTVT: 40 ms
TZON-0010SLOWVT: 40 ms
TZST-0001FASTVT: 3
TZST-0001SLOWVT: 2
TZST-0001SLOWVT: 5
TZST-0003FASTVT: 890 V
TZST-0003SLOWVT: 650 V
TZST-0003SLOWVT: 890 V
VENTRICULAR PACING ICD: 0.02 pct

## 2011-11-24 NOTE — Progress Notes (Signed)
ICD check by industry for research 

## 2012-02-07 ENCOUNTER — Other Ambulatory Visit: Payer: Self-pay

## 2012-02-07 MED ORDER — ROSUVASTATIN CALCIUM 40 MG PO TABS: 40.0000 mg | ORAL_TABLET | Freq: Every day | ORAL | Status: DC

## 2012-02-10 ENCOUNTER — Other Ambulatory Visit: Payer: Self-pay | Admitting: *Deleted

## 2012-02-10 MED ORDER — ROSUVASTATIN CALCIUM 40 MG PO TABS: 40.0000 mg | ORAL_TABLET | Freq: Every day | ORAL | Status: DC

## 2012-02-28 ENCOUNTER — Other Ambulatory Visit: Payer: Self-pay

## 2012-02-28 ENCOUNTER — Ambulatory Visit (INDEPENDENT_AMBULATORY_CARE_PROVIDER_SITE_OTHER): Payer: BC Managed Care – PPO | Admitting: *Deleted

## 2012-02-28 ENCOUNTER — Encounter (INDEPENDENT_AMBULATORY_CARE_PROVIDER_SITE_OTHER): Payer: BC Managed Care – PPO

## 2012-02-28 ENCOUNTER — Encounter: Payer: Self-pay | Admitting: Internal Medicine

## 2012-02-28 DIAGNOSIS — I2589 Other forms of chronic ischemic heart disease: Secondary | ICD-10-CM

## 2012-02-28 DIAGNOSIS — R0989 Other specified symptoms and signs involving the circulatory and respiratory systems: Secondary | ICD-10-CM

## 2012-02-28 DIAGNOSIS — I429 Cardiomyopathy, unspecified: Secondary | ICD-10-CM

## 2012-03-01 LAB — ICD DEVICE OBSERVATION
AL IMPEDENCE ICD: 462.5 Ohm
ATRIAL PACING ICD: 0.35 pct
BAMS-0001: 180 {beats}/min
CHARGE TIME: 9 s
DEV-0020ICD: NEGATIVE
FVT: 0
MODE SWITCH EPISODES: 0
RV LEAD AMPLITUDE: 11.7 mv
RV LEAD THRESHOLD: 1 V
TOT-0008: 0
TZAT-0001FASTVT: 1
TZAT-0001SLOWVT: 1
TZAT-0004FASTVT: 8
TZAT-0004SLOWVT: 8
TZAT-0012FASTVT: 200 ms
TZAT-0012SLOWVT: 200 ms
TZAT-0013FASTVT: 2
TZAT-0019SLOWVT: 7.5 V
TZAT-0020FASTVT: 1 ms
TZON-0005FASTVT: 6
TZON-0010SLOWVT: 40 ms
TZST-0001FASTVT: 3
TZST-0001FASTVT: 5
TZST-0001SLOWVT: 3
TZST-0001SLOWVT: 4
TZST-0003FASTVT: 650 V
TZST-0003FASTVT: 800 V
TZST-0003FASTVT: 890 V
TZST-0003SLOWVT: 650 V
TZST-0003SLOWVT: 890 V
VENTRICULAR PACING ICD: 0.02 pct

## 2012-03-01 NOTE — Progress Notes (Signed)
icd check in clinic by research  

## 2012-04-26 ENCOUNTER — Ambulatory Visit (INDEPENDENT_AMBULATORY_CARE_PROVIDER_SITE_OTHER): Payer: BC Managed Care – PPO | Admitting: Internal Medicine

## 2012-04-26 ENCOUNTER — Encounter: Payer: Self-pay | Admitting: Internal Medicine

## 2012-04-26 VITALS — BP 113/72 | HR 61 | Ht 73.0 in | Wt 219.0 lb

## 2012-04-26 DIAGNOSIS — I429 Cardiomyopathy, unspecified: Secondary | ICD-10-CM

## 2012-04-26 DIAGNOSIS — I5022 Chronic systolic (congestive) heart failure: Secondary | ICD-10-CM

## 2012-04-26 DIAGNOSIS — Z95810 Presence of automatic (implantable) cardiac defibrillator: Secondary | ICD-10-CM

## 2012-04-26 LAB — ICD DEVICE OBSERVATION
AL AMPLITUDE: 5 mv
AL THRESHOLD: 0.75 V
BAMS-0001: 180 {beats}/min
CHARGE TIME: 9 s
DEVICE MODEL ICD: 815371
RV LEAD AMPLITUDE: 12 mv
RV LEAD THRESHOLD: 1 V
TZAT-0001FASTVT: 1
TZAT-0001SLOWVT: 1
TZAT-0004FASTVT: 8
TZAT-0004SLOWVT: 8
TZAT-0012FASTVT: 200 ms
TZAT-0013FASTVT: 2
TZAT-0019SLOWVT: 7.5 V
TZAT-0020FASTVT: 1 ms
TZAT-0020SLOWVT: 1 ms
TZON-0004SLOWVT: 40
TZON-0010SLOWVT: 40 ms
TZST-0001FASTVT: 3
TZST-0001FASTVT: 5
TZST-0001SLOWVT: 3
TZST-0001SLOWVT: 4
TZST-0003FASTVT: 650 V
TZST-0003SLOWVT: 650 V
TZST-0003SLOWVT: 890 V

## 2012-04-26 NOTE — Progress Notes (Signed)
[facility] Patient Care Team: Selinda Flavin, MD as PCP - General (Cardiology)   HPI  Derrick Lane is a 52 y.o. male Seen in followup for ICD implantation for primary prevention in the setting of ischemic heart disease.  His history of coronary artery disease with prior anterior MI in May 2011 treated with DES. Hospitalization c/b ARDS. EF on d/c 40-45%. March 2012 he developed acute congestive heart failure. Reassessment of his ejection fraction demonstrated that it was 10-15%. 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 He underwent ICD implantation December 2012 Last seen in her care clinic 6/13. At that time his ejection fraction increased to 30%.  Past Medical History  Diagnosis Date  . HLD (hyperlipidemia)   . GERD (gastroesophageal reflux disease)   . Chronic systolic heart failure     EF 29-56%.   Marland Kitchen CAD (coronary artery disease)     s/p Anterior MI 5/11. LAD stent  . Chronic kidney disease, stage III (moderate)   . Dual implantable cardiac defibrillator     St. Jude-Analyze ST study    No past surgical history on file.  Current Outpatient Prescriptions  Medication Sig Dispense Refill  . aspirin 81 MG tablet Take 81 mg by mouth daily.        . candesartan (ATACAND) 8 MG tablet TAKE 1 TABLET EVERY MORNING AND 2 TABLETS EVERY EVENING  90 tablet  11  . carvedilol (COREG) 12.5 MG tablet Take 1 tablet (12.5 mg total) by mouth 2 (two) times daily with a meal.  60 tablet  9  . cetirizine (ZYRTEC) 10 MG tablet Take 10 mg by mouth daily.        . clopidogrel (PLAVIX) 75 MG tablet Take 1 tablet (75 mg total) by mouth daily.  30 tablet  11  . eplerenone (INSPRA) 25 MG tablet       . furosemide (LASIX) 20 MG tablet take 1 tablet by mouth every morning  30 tablet  5  . nitroGLYCERIN (NITROSTAT) 0.4 MG SL tablet Place 1 tablet (0.4 mg total) under the tongue every 5 (five) minutes as needed for  chest pain.  25 tablet  9  . rosuvastatin (CRESTOR) 40 MG tablet Take 1 tablet (40 mg total) by mouth daily.  30 tablet  5   No current facility-administered medications for this visit.    Allergies  Allergen Reactions  . Beef-Derived Products   . Pork-Derived Products   . Ramipril     Possible lip swelling but may have been attributed to other allergy    Review of Systems negative except from HPI and PMH  Physical Exam BP 113/72  Pulse 61  Ht 6\' 1"  (1.854 m)  Wt 219 lb (99.338 kg)  BMI 28.9 kg/m2  SpO2 98% Well developed and nourished in no acute distress HENT normal Neck supple with JVP-flat Clear Regular rate and rhythm, no murmurs or gallops Abd-soft with active BS No Clubbing cyanosis edema Skin-warm and dry A & Oriented  Grossly normal sensory and motor function     Assessment and  Plan

## 2012-04-26 NOTE — Patient Instructions (Addendum)
Your physician recommends that you schedule a follow-up appointment in: next available with Dr. Gala Romney in the heart failure clinic.  Your physician wants you to follow-up in: February 2015 with Dr. Graciela Husbands. You will receive a reminder letter in the mail two months in advance. If you don't receive a letter, please call our office to schedule the follow-up appointment.  Your physician recommends that you continue on your current medications as directed. Please refer to the Current Medication list given to you today.

## 2012-04-29 NOTE — Assessment & Plan Note (Signed)
euvolemic 

## 2012-04-29 NOTE — Assessment & Plan Note (Signed)
The patient's device was interrogated.  The information was reviewed. No changes were made in the programming.    

## 2012-04-29 NOTE — Assessment & Plan Note (Signed)
-  continue current meds  

## 2012-05-04 ENCOUNTER — Other Ambulatory Visit: Payer: Self-pay | Admitting: Internal Medicine

## 2012-05-08 ENCOUNTER — Encounter: Payer: Self-pay | Admitting: Cardiology

## 2012-05-14 ENCOUNTER — Ambulatory Visit (HOSPITAL_COMMUNITY)
Admission: RE | Admit: 2012-05-14 | Discharge: 2012-05-14 | Disposition: A | Discharge status: Home / Self Care | Payer: BC Managed Care – PPO | Source: Ambulatory Visit | Attending: Internal Medicine | Admitting: Internal Medicine

## 2012-05-14 ENCOUNTER — Encounter (HOSPITAL_COMMUNITY): Payer: Self-pay

## 2012-05-14 VITALS — BP 110/78 | HR 84 | Ht 72.0 in | Wt 216.2 lb

## 2012-05-14 DIAGNOSIS — E785 Hyperlipidemia, unspecified: Secondary | ICD-10-CM | POA: Insufficient documentation

## 2012-05-14 DIAGNOSIS — K219 Gastro-esophageal reflux disease without esophagitis: Secondary | ICD-10-CM | POA: Insufficient documentation

## 2012-05-14 DIAGNOSIS — N183 Chronic kidney disease, stage 3 unspecified: Secondary | ICD-10-CM | POA: Insufficient documentation

## 2012-05-14 DIAGNOSIS — Z9861 Coronary angioplasty status: Secondary | ICD-10-CM | POA: Insufficient documentation

## 2012-05-14 DIAGNOSIS — I509 Heart failure, unspecified: Secondary | ICD-10-CM

## 2012-05-14 DIAGNOSIS — I2510 Atherosclerotic heart disease of native coronary artery without angina pectoris: Secondary | ICD-10-CM | POA: Insufficient documentation

## 2012-05-14 DIAGNOSIS — Z79899 Other long term (current) drug therapy: Secondary | ICD-10-CM | POA: Insufficient documentation

## 2012-05-14 DIAGNOSIS — I252 Old myocardial infarction: Secondary | ICD-10-CM | POA: Insufficient documentation

## 2012-05-14 DIAGNOSIS — Z7982 Long term (current) use of aspirin: Secondary | ICD-10-CM | POA: Insufficient documentation

## 2012-05-14 DIAGNOSIS — Z7902 Long term (current) use of antithrombotics/antiplatelets: Secondary | ICD-10-CM | POA: Insufficient documentation

## 2012-05-14 DIAGNOSIS — I5022 Chronic systolic (congestive) heart failure: Secondary | ICD-10-CM | POA: Insufficient documentation

## 2012-05-14 DIAGNOSIS — Z95810 Presence of automatic (implantable) cardiac defibrillator: Secondary | ICD-10-CM | POA: Insufficient documentation

## 2012-05-14 MED ORDER — CANDESARTAN CILEXETIL 8 MG PO TABS: 16.0000 mg | ORAL_TABLET | Freq: Two times a day (BID) | ORAL | Status: DC

## 2012-05-14 NOTE — Addendum Note (Signed)
Encounter addended by: Noralee Space, RN on: 05/14/2012  4:29 PM<BR>     Documentation filed: Orders

## 2012-05-14 NOTE — Assessment & Plan Note (Addendum)
No evidence of ischemia. Continue current regimen.   Attending: Agree.

## 2012-05-14 NOTE — Assessment & Plan Note (Addendum)
NYHA I. Volume status stable. Continue current regimen. Follow up in 6 months with and ECHO.   Patient seen and examined with Tonye Becket, NP. We discussed all aspects of the encounter. I agree with the assessment and plan as stated above.  Doing great NYHA I. Volume status looks great. Will try again to increase candesartan to 16 bid. Due for repeat echo.

## 2012-05-14 NOTE — Patient Instructions (Addendum)
Follow up in 6 months  Do the following things EVERYDAY: 1) Weigh yourself in the morning before breakfast. Write it down and keep it in a log. 2) Take your medicines as prescribed 3) Eat low salt foods-Limit salt (sodium) to 2000 mg per day.  4) Stay as active as you can everyday 5) Limit all fluids for the day to less than 2 liters 

## 2012-05-14 NOTE — Progress Notes (Signed)
Patient ID: Derrick Lane, male   DOB: 1960-10-19, 52 y.o.   MRN: [id number] EP: Dr Graciela Husbands HPI: [name] is a 2 y/o with h/o CAD s/p anterior MI in May 2011 treated with DES, LHC 08/03/2010: LAD 40% stent patent LCX 30-40% prox OM-1 stent patent RCA 40% EF 15%. S/P  ICD placement - St. Jude dual chamber ICD on Aug 05, 2010, and  Chronic systolic heart failure EF 30% 04/979,  He not on Ace-I due to angioedema . He is not Spironolactone due to breast tenderness.  Does not tolerate hydralazine due to hypotension. 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  CO (Fick) 4.83 CI 2.42   CPX 07/08/10: pVO2 25.6 (74%) slope 34 ME/MVV 67% RER 1.17 Normal HR and BP response.   LHC 08/03/2010: LAD 40% stent patent LCX 30-40% prox OM-1 stent patent RCA 40% EF 15%.  06/13/11 ECHO 30%  He returns for follow up. Last visit spiro stopped and switched to Eplerenone due to breast tenderness. Denies SOB/PND/Orthopnea. Weight at home 215-216 pounds. [place].  Compliant with medications. BP improved. Systolics 115-125.       ROS: All systems negative except as listed in HPI, PMH and Problem List.  Past Medical History  Diagnosis Date  . HLD (hyperlipidemia)   . GERD (gastroesophageal reflux disease)   . Chronic systolic heart failure     EF 19-14%.   Marland Kitchen CAD (coronary artery disease)     s/p Anterior MI 5/11. LAD stent  . Chronic kidney disease, stage III (moderate)   . Dual implantable cardiac defibrillator     St. Jude-Analyze ST study    Current Outpatient Prescriptions  Medication Sig Dispense Refill  . aspirin 81 MG tablet Take 81 mg by mouth daily.        . candesartan (ATACAND) 8 MG tablet TAKE 1 TABLET EVERY MORNING AND 2 TABLETS EVERY EVENING  90 tablet  11  . carvedilol (COREG) 12.5 MG tablet Take 1 tablet (12.5 mg total) by mouth 2 (two) times daily with a meal.  60 tablet  9  . cetirizine (ZYRTEC) 10 MG tablet Take 10 mg by mouth  daily.        . clopidogrel (PLAVIX) 75 MG tablet Take 1 tablet (75 mg total) by mouth daily.  30 tablet  11  . eplerenone (INSPRA) 25 MG tablet Take 25 mg by mouth daily.       . furosemide (LASIX) 20 MG tablet take 1 tablet by mouth every morning  30 tablet  5  . nitroGLYCERIN (NITROSTAT) 0.4 MG SL tablet Place 1 tablet (0.4 mg total) under the tongue every 5 (five) minutes as needed for chest pain.  25 tablet  9  . rosuvastatin (CRESTOR) 40 MG tablet Take 1 tablet (40 mg total) by mouth daily.  30 tablet  5   No current facility-administered medications for this encounter.     PHYSICAL EXAM: Filed Vitals:   05/14/12 1537  BP: 110/78  Pulse: 84  Wt 213  (210)   General:  Well appearing. No resp difficulty HEENT: normal Neck: supple. JVP flat. Carotids 2+ bilaterally; no bruits. No lymphadenopathy or thryomegaly appreciated. Cor: PMI normal. Regular rate & rhythm. No rubs, murmurs.  Lungs: clear Abdomen: soft, nontender, nondistended. No hepatosplenomegaly. No bruits or masses. Good bowel sounds. Extremities: no cyanosis, clubbing, rash, edema Neuro: alert & orientedx3, cranial nerves grossly intact. Moves all  4 extremities w/o difficulty. Affect pleasant.    ASSESSMENT & PLAN:

## 2012-05-22 ENCOUNTER — Ambulatory Visit (HOSPITAL_COMMUNITY)
Admission: RE | Admit: 2012-05-22 | Discharge: 2012-05-22 | Disposition: A | Discharge status: Home / Self Care | Payer: BC Managed Care – PPO | Source: Ambulatory Visit | Attending: Internal Medicine | Admitting: Internal Medicine

## 2012-05-22 DIAGNOSIS — I509 Heart failure, unspecified: Secondary | ICD-10-CM | POA: Insufficient documentation

## 2012-05-22 DIAGNOSIS — I5022 Chronic systolic (congestive) heart failure: Secondary | ICD-10-CM

## 2012-05-22 DIAGNOSIS — I059 Rheumatic mitral valve disease, unspecified: Secondary | ICD-10-CM | POA: Insufficient documentation

## 2012-05-22 DIAGNOSIS — I517 Cardiomegaly: Secondary | ICD-10-CM

## 2012-05-22 DIAGNOSIS — I079 Rheumatic tricuspid valve disease, unspecified: Secondary | ICD-10-CM | POA: Insufficient documentation

## 2012-05-22 NOTE — Progress Notes (Signed)
  Echocardiogram 2D Echocardiogram has been performed.  Cathie Beams 05/22/2012, 10:55 AM

## 2012-05-29 ENCOUNTER — Other Ambulatory Visit: Payer: Self-pay | Admitting: Internal Medicine

## 2012-06-06 ENCOUNTER — Encounter: Payer: Self-pay | Admitting: Internal Medicine

## 2012-06-07 ENCOUNTER — Encounter: Payer: Self-pay | Admitting: Internal Medicine

## 2012-06-29 ENCOUNTER — Other Ambulatory Visit: Payer: Self-pay | Admitting: Internal Medicine

## 2012-07-23 ENCOUNTER — Ambulatory Visit (INDEPENDENT_AMBULATORY_CARE_PROVIDER_SITE_OTHER): Payer: BC Managed Care – PPO | Admitting: *Deleted

## 2012-07-23 DIAGNOSIS — I429 Cardiomyopathy, unspecified: Secondary | ICD-10-CM

## 2012-07-23 DIAGNOSIS — Z95810 Presence of automatic (implantable) cardiac defibrillator: Secondary | ICD-10-CM

## 2012-07-24 NOTE — Progress Notes (Signed)
Research device check by industry. Changes made per prescribed settings for Analyze ST study. All device functions normal, no other changes made, full details in [employer]  /CK[facility] 

## 2012-07-25 LAB — ICD DEVICE OBSERVATION
AL AMPLITUDE: 5 mv
BAMS-0001: 180 {beats}/min
DEVICE MODEL ICD: 815371
HV IMPEDENCE: 45 Ohm
RV LEAD AMPLITUDE: 11.2 mv
RV LEAD IMPEDENCE ICD: 350 Ohm
TZAT-0001FASTVT: 1
TZAT-0004SLOWVT: 8
TZAT-0012FASTVT: 200 ms
TZAT-0012SLOWVT: 200 ms
TZAT-0013FASTVT: 2
TZAT-0013SLOWVT: 4
TZAT-0018SLOWVT: NEGATIVE
TZAT-0019FASTVT: 7.5 V
TZAT-0019SLOWVT: 7.5 V
TZAT-0020FASTVT: 1 ms
TZON-0003SLOWVT: 330 ms
TZON-0004FASTVT: 30
TZON-0004SLOWVT: 40
TZON-0005FASTVT: 6
TZON-0005SLOWVT: 6
TZST-0001FASTVT: 2
TZST-0001FASTVT: 4
TZST-0001SLOWVT: 3
TZST-0003FASTVT: 800 V
TZST-0003FASTVT: 890 V
TZST-0003SLOWVT: 650 V
TZST-0003SLOWVT: 800 V
VENTRICULAR PACING ICD: 1 pct

## 2012-10-17 DIAGNOSIS — C4491 Basal cell carcinoma of skin, unspecified: Secondary | ICD-10-CM

## 2012-10-17 HISTORY — DX: Basal cell carcinoma of skin, unspecified: C44.91

## 2012-10-25 ENCOUNTER — Other Ambulatory Visit: Payer: Self-pay | Admitting: Internal Medicine

## 2012-10-29 ENCOUNTER — Ambulatory Visit (INDEPENDENT_AMBULATORY_CARE_PROVIDER_SITE_OTHER): Payer: BC Managed Care – PPO | Admitting: *Deleted

## 2012-10-29 DIAGNOSIS — I429 Cardiomyopathy, unspecified: Secondary | ICD-10-CM

## 2012-10-30 LAB — REMOTE ICD DEVICE
AL AMPLITUDE: 4 mv
ATRIAL PACING ICD: 1 pct
BAMS-0001: 180 {beats}/min
DEV-0020ICD: NEGATIVE
DEVICE MODEL ICD: 815371
HV IMPEDENCE: 44 Ohm
RV LEAD IMPEDENCE ICD: 300 Ohm
TZAT-0001SLOWVT: 1
TZAT-0012FASTVT: 200 ms
TZAT-0013FASTVT: 2
TZAT-0020SLOWVT: 1 ms
TZON-0005SLOWVT: 6
TZON-0010FASTVT: 40 ms
TZST-0001FASTVT: 2
TZST-0001FASTVT: 5
TZST-0001SLOWVT: 2
TZST-0001SLOWVT: 4
TZST-0003FASTVT: 650 V
TZST-0003FASTVT: 800 V
TZST-0003FASTVT: 890 V
TZST-0003SLOWVT: 650 V
TZST-0003SLOWVT: 800 V

## 2012-11-08 ENCOUNTER — Encounter: Payer: Self-pay | Admitting: Internal Medicine

## 2012-11-08 NOTE — Progress Notes (Signed)
Remote defib received  

## 2012-11-09 ENCOUNTER — Encounter: Payer: Self-pay | Admitting: *Deleted

## 2012-11-16 ENCOUNTER — Telehealth (HOSPITAL_COMMUNITY): Payer: Self-pay | Admitting: Cardiology

## 2012-11-16 DIAGNOSIS — I5022 Chronic systolic (congestive) heart failure: Secondary | ICD-10-CM

## 2012-11-16 NOTE — Telephone Encounter (Signed)
Order placed for echo.

## 2012-11-19 ENCOUNTER — Encounter: Payer: Self-pay | Admitting: Internal Medicine

## 2012-11-22 ENCOUNTER — Ambulatory Visit (HOSPITAL_COMMUNITY)
Admission: RE | Admit: 2012-11-22 | Discharge: 2012-11-22 | Disposition: A | Discharge status: Home / Self Care | Payer: BC Managed Care – PPO | Source: Ambulatory Visit | Attending: Internal Medicine | Admitting: Internal Medicine

## 2012-11-22 ENCOUNTER — Encounter (HOSPITAL_COMMUNITY): Payer: Self-pay

## 2012-11-22 ENCOUNTER — Ambulatory Visit (HOSPITAL_BASED_OUTPATIENT_CLINIC_OR_DEPARTMENT_OTHER)
Admission: RE | Admit: 2012-11-22 | Discharge: 2012-11-22 | Disposition: A | Discharge status: Home / Self Care | Payer: BC Managed Care – PPO | Source: Ambulatory Visit | Attending: Internal Medicine | Admitting: Internal Medicine

## 2012-11-22 VITALS — BP 118/78 | HR 58 | Wt 223.0 lb

## 2012-11-22 DIAGNOSIS — I509 Heart failure, unspecified: Secondary | ICD-10-CM | POA: Insufficient documentation

## 2012-11-22 DIAGNOSIS — I369 Nonrheumatic tricuspid valve disorder, unspecified: Secondary | ICD-10-CM

## 2012-11-22 DIAGNOSIS — I5022 Chronic systolic (congestive) heart failure: Secondary | ICD-10-CM

## 2012-11-22 DIAGNOSIS — I2510 Atherosclerotic heart disease of native coronary artery without angina pectoris: Secondary | ICD-10-CM

## 2012-11-22 DIAGNOSIS — I079 Rheumatic tricuspid valve disease, unspecified: Secondary | ICD-10-CM | POA: Insufficient documentation

## 2012-11-22 DIAGNOSIS — I700 Atherosclerosis of aorta: Secondary | ICD-10-CM | POA: Insufficient documentation

## 2012-11-22 NOTE — Progress Notes (Signed)
  Echocardiogram 2D Echocardiogram has been performed.  Derrick Lane 11/22/2012, 12:52 PM

## 2012-11-22 NOTE — Progress Notes (Signed)
Patient ID: Derrick Lane, male   DOB: 05/19/1960, 52 y.o.   MRN: [id number]  EP: Dr Graciela Husbands  HPI: [name] is a 52 y/o with h/o CAD s/p anterior MI in May 2011 treated with DES, LHC 08/03/2010: LAD 40% stent patent LCX 30-40% prox OM-1 stent patent RCA 40% EF 15%. S/P  ICD placement - St. Jude dual chamber ICD on Aug 05, 2010, and  Chronic systolic heart failure EF 30% 04/979,  He not on Ace-I due to angioedema . He is not Spironolactone due to breast tenderness.  Does not tolerate hydralazine due to hypotension. 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  CO (Fick) 4.83 CI 2.42   CPX 07/08/10: pVO2 25.6 (74%) slope 34 ME/MVV 67% RER 1.17 Normal HR and BP response.   LHC 08/03/2010: LAD 40% stent patent LCX 30-40% prox OM-1 stent patent RCA 40% EF 15%.  06/13/11 ECHO 30% 11/22/12 ECHO 30-35%, RV good  Follow up: Doing well. Denies SOB, orthopnea, PND, or CP. Taking medications as prescribed. Retired and enjoying his time with the grandchildren. Weight stable 215-216 lbs. Working with [employer] and just got back from car race. Has not been exercising much, but is working on changing that. Following low salt diet and drinking less than 2L a day.       ROS: All systems negative except as listed in HPI, PMH and Problem List.  Past Medical History  Diagnosis Date  . HLD (hyperlipidemia)   . GERD (gastroesophageal reflux disease)   . Chronic systolic heart failure     EF 19-14%.   Marland Kitchen CAD (coronary artery disease)     s/p Anterior MI 5/11. LAD stent  . Chronic kidney disease, stage III (moderate)   . Dual implantable cardiac defibrillator     St. Jude-Analyze ST study    Current Outpatient Prescriptions  Medication Sig Dispense Refill  . aspirin 81 MG tablet Take 81 mg by mouth daily.        . candesartan (ATACAND) 8 MG tablet Take 2 tablets (16 mg total) by mouth 2 (two) times daily.  120 tablet  6  . carvedilol (COREG) 12.5 MG tablet take 1 tablet by mouth  twice a day with food  60 tablet  9  . cetirizine (ZYRTEC) 10 MG tablet Take 10 mg by mouth daily.        . clopidogrel (PLAVIX) 75 MG tablet take 1 tablet by mouth once daily  30 tablet  10  . eplerenone (INSPRA) 25 MG tablet take 1 tablet by mouth once daily  30 tablet  6  . furosemide (LASIX) 20 MG tablet take 1 tablet by mouth every morning  30 tablet  6  . nitroGLYCERIN (NITROSTAT) 0.4 MG SL tablet Place 1 tablet (0.4 mg total) under the tongue every 5 (five) minutes as needed for chest pain.  25 tablet  9  . rosuvastatin (CRESTOR) 40 MG tablet Take 1 tablet (40 mg total) by mouth daily.  30 tablet  5   No current facility-administered medications for this encounter.     Filed Vitals:   11/22/12 1013  BP: 118/78  Pulse: 58  Weight: 223 lb (101.152 kg)  SpO2: 98%   PHYSICAL EXAM: General:  Well appearing. No resp difficulty HEENT: normal Neck: supple. JVP flat. Carotids 2+ bilaterally; no bruits. No lymphadenopathy or thryomegaly appreciated. Cor: PMI normal. Regular rate & rhythm. No rubs, murmurs.  Lungs: clear Abdomen: soft,  nontender, nondistended. No hepatosplenomegaly. No bruits or masses. Good bowel sounds. Extremities: no cyanosis, clubbing, rash, edema Neuro: alert & orientedx3, cranial nerves grossly intact. Moves all 4 extremities w/o difficulty. Affect pleasant.   ASSESSMENT & PLAN:  1) CAD:  - no s/s of ischemia. - continue ASA, statin, plavix and BB - will check cholesterol in the next few weeks once fasting along with CMET 2) Chronic systolic HF: ICM, EF 30-35% (11/2012) s/p ICD - NYHA II symptoms and volume status stable. Dr. Gala Romney reviewed and discussed ECHO. His EF is stable compared to ECHO in 05/2012. - Will not titrate coreg d/t bradycardia. Continue 12.5 mg BID. - Candesartan 16 mg BID at goal and eplerenone 25 mg goal. - encouraged to try and start exercising more. - check CMET next week.  - Next visit will discuss repeating CPX - Reinforced  the need and importance of daily weights, a low sodium diet, and fluid restriction (less than 2 L a day). Instructed to call the HF clinic if weight increases more than 3 lbs overnight or 5 lbs in a week.   F/U 6 months Ulla Potash B NP-C 10:53 AM  Patient seen and examined with Ulla Potash, NP. We discussed all aspects of the encounter. I agree with the assessment and plan as stated above. He is doing great. NYHA I-II. Meds well titrated. Volume status looks good. EF stable by echo (I reviewed personally). Continue current regimen. Encouraged him to get more regular exercise. Check labs today. We discussed repeating CPX to objectively gauge improvement and help with prognosis.   Bobbye Petti,MD 11:42 PM

## 2012-11-22 NOTE — Patient Instructions (Signed)
Doing great.  Try to start exercising more.  Continue current medications.  Get labs in the next 2 weeks when fasting.  F/U 6 months.  Do the following things EVERYDAY: 1) Weigh yourself in the morning before breakfast. Write it down and keep it in a log. 2) Take your medicines as prescribed 3) Eat low salt foods-Limit salt (sodium) to 2000 mg per day.  4) Stay as active as you can everyday Limit all fluids for the day to less than 2 liters

## 2012-11-23 ENCOUNTER — Encounter: Payer: Self-pay | Admitting: Internal Medicine

## 2012-12-03 ENCOUNTER — Other Ambulatory Visit (HOSPITAL_COMMUNITY): Payer: Self-pay | Admitting: *Deleted

## 2012-12-03 DIAGNOSIS — I509 Heart failure, unspecified: Secondary | ICD-10-CM

## 2012-12-03 MED ORDER — CANDESARTAN CILEXETIL 8 MG PO TABS: 16.0000 mg | ORAL_TABLET | Freq: Two times a day (BID) | ORAL | Status: DC

## 2012-12-17 ENCOUNTER — Telehealth (HOSPITAL_COMMUNITY): Payer: Self-pay | Admitting: *Deleted

## 2012-12-17 NOTE — Telephone Encounter (Signed)
Needed PA for [employer], called [employer] at [phone], med was approved from 11/17/12-12/17/13 PA # [id number]

## 2013-01-22 ENCOUNTER — Other Ambulatory Visit: Payer: Self-pay | Admitting: Internal Medicine

## 2013-02-04 ENCOUNTER — Encounter: Payer: BC Managed Care – PPO | Admitting: Internal Medicine

## 2013-02-08 ENCOUNTER — Encounter: Payer: BC Managed Care – PPO | Admitting: Internal Medicine

## 2013-02-11 ENCOUNTER — Encounter: Payer: Self-pay | Admitting: Internal Medicine

## 2013-02-11 ENCOUNTER — Ambulatory Visit (INDEPENDENT_AMBULATORY_CARE_PROVIDER_SITE_OTHER): Payer: BC Managed Care – PPO | Admitting: Internal Medicine

## 2013-02-11 VITALS — BP 110/59 | HR 49 | Ht 74.0 in

## 2013-02-11 DIAGNOSIS — Z95810 Presence of automatic (implantable) cardiac defibrillator: Secondary | ICD-10-CM

## 2013-02-11 DIAGNOSIS — I5022 Chronic systolic (congestive) heart failure: Secondary | ICD-10-CM

## 2013-02-11 DIAGNOSIS — I429 Cardiomyopathy, unspecified: Secondary | ICD-10-CM

## 2013-02-11 NOTE — Assessment & Plan Note (Signed)
The patient's device was interrogated.  The information was reviewed. No changes were made in the programming.    

## 2013-02-11 NOTE — Progress Notes (Signed)
      Patient Care Team: Rory Percy, MD as PCP - General (Cardiology)   HPI  Derrick Lane is a 53 y.o. male Seen in followup for ICD implantation for primary prevention in the setting of ischemic heart disease.  His history of coronary artery disease with prior anterior MI in May 2011 treated with DES. Hospitalization c/b ARDS. EF on d/c 40-45%. March 2012 he developed acute congestive heart failure. Reassessment of his ejection fraction demonstrated that it was 10-15%. 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 He underwent ICD implantation December 2012  Last seen in her care clinic 6/13. At that  The patient denies chest pain, shortness of breath, nocturnal dyspnea, orthopnea or peripheral edema.  There have been no palpitations, lightheadedness or syncope.  Recent echo demonstrates an ejection fraction of 35-40%  Past Medical History  Diagnosis Date  . HLD (hyperlipidemia)   . GERD (gastroesophageal reflux disease)   . Chronic systolic heart failure     EF 10-15%.   Marland Kitchen CAD (coronary artery disease)     s/p Anterior MI 5/11. LAD stent  . Chronic kidney disease, stage III (moderate)   . Dual implantable cardiac defibrillator     St. Jude-Analyze ST study    No past surgical history on file.  Current Outpatient Prescriptions  Medication Sig Dispense Refill  . aspirin 81 MG tablet Take 81 mg by mouth daily.        . candesartan (ATACAND) 8 MG tablet Take 2 tablets (16 mg total) by mouth 2 (two) times daily.  120 tablet  3  . carvedilol (COREG) 12.5 MG tablet take 1 tablet by mouth twice a day with food  60 tablet  9  . cetirizine (ZYRTEC) 10 MG tablet Take 10 mg by mouth daily.        . clopidogrel (PLAVIX) 75 MG tablet take 1 tablet by mouth once daily  30 tablet  10  . CRESTOR 40 MG tablet take 1 tablet by mouth once daily  30 tablet  5  . eplerenone (INSPRA) 25 MG  tablet take 1 tablet by mouth once daily  30 tablet  6  . furosemide (LASIX) 20 MG tablet take 1 tablet by mouth every morning  30 tablet  6  . nitroGLYCERIN (NITROSTAT) 0.4 MG SL tablet Place 1 tablet (0.4 mg total) under the tongue every 5 (five) minutes as needed for chest pain.  25 tablet  9   No current facility-administered medications for this visit.    Allergies  Allergen Reactions  . Beef-Derived Products   . Peach Flavor   . Pork-Derived Products   . Ramipril     Possible lip swelling but may have been attributed to other allergy  . Spironolactone     Review of Systems negative except from HPI and PMH  Physical Exam BP 110/59  Pulse 49  Ht 6\' 2"  (1.88 m) Well developed and nourished in no acute distress HENT normal Neck supple with JVP-flat Clear Device pocket well healed; without hematoma or erythema.  There is no tethering Regular rate and rhythm, no murmurs or gallops Abd-soft with active BS No Clubbing cyanosis edema Skin-warm and dry A & Oriented  Grossly normal sensory and motor function     Assessment and  Plan

## 2013-02-11 NOTE — Assessment & Plan Note (Signed)
Euvolemic. Continue current meds

## 2013-02-11 NOTE — Assessment & Plan Note (Signed)
As above.

## 2013-02-11 NOTE — Patient Instructions (Signed)

## 2013-02-21 ENCOUNTER — Encounter: Payer: Self-pay | Admitting: Internal Medicine

## 2013-03-12 ENCOUNTER — Ambulatory Visit: Payer: BC Managed Care – PPO | Admitting: Internal Medicine

## 2013-03-14 ENCOUNTER — Other Ambulatory Visit: Payer: Self-pay | Admitting: Internal Medicine

## 2013-05-08 ENCOUNTER — Other Ambulatory Visit (HOSPITAL_COMMUNITY): Payer: Self-pay | Admitting: *Deleted

## 2013-05-08 ENCOUNTER — Encounter (HOSPITAL_COMMUNITY): Payer: Self-pay

## 2013-05-08 DIAGNOSIS — I509 Heart failure, unspecified: Secondary | ICD-10-CM

## 2013-05-08 MED ORDER — CANDESARTAN CILEXETIL 8 MG PO TABS: 16.0000 mg | ORAL_TABLET | Freq: Two times a day (BID) | ORAL | Status: DC

## 2013-05-15 ENCOUNTER — Encounter: Payer: Self-pay | Admitting: Internal Medicine

## 2013-05-15 ENCOUNTER — Telehealth: Payer: Self-pay | Admitting: Cardiology

## 2013-05-15 ENCOUNTER — Ambulatory Visit (INDEPENDENT_AMBULATORY_CARE_PROVIDER_SITE_OTHER): Payer: BC Managed Care – PPO | Admitting: *Deleted

## 2013-05-15 DIAGNOSIS — I429 Cardiomyopathy, unspecified: Secondary | ICD-10-CM

## 2013-05-15 LAB — MDC_IDC_ENUM_SESS_TYPE_REMOTE
Brady Statistic RA Percent Paced: 1 % — CL
HighPow Impedance: 42 Ohm
Implantable Pulse Generator Serial Number: 815371
Lead Channel Impedance Value: 310 Ohm
Lead Channel Impedance Value: 400 Ohm
Lead Channel Sensing Intrinsic Amplitude: 11.2 mV
Lead Channel Sensing Intrinsic Amplitude: 5 mV
Lead Channel Setting Pacing Amplitude: 2.5 V
Lead Channel Setting Pacing Amplitude: 2.5 V
Lead Channel Setting Pacing Pulse Width: 0.5 ms
MDC IDC MSMT BATTERY REMAINING PERCENTAGE: 71 %
MDC IDC SET LEADCHNL RV SENSING SENSITIVITY: 0.5 mV
MDC IDC SET ZONE DETECTION INTERVAL: 250 ms
MDC IDC SET ZONE DETECTION INTERVAL: 285 ms
MDC IDC STAT BRADY RV PERCENT PACED: 1 % — AB
Zone Setting Detection Interval: 330 ms

## 2013-05-15 NOTE — Telephone Encounter (Signed)
LMOVM reminding pt to send remote transmission.   

## 2013-05-16 ENCOUNTER — Ambulatory Visit (HOSPITAL_COMMUNITY)
Admission: RE | Admit: 2013-05-16 | Discharge: 2013-05-16 | Disposition: A | Discharge status: Home / Self Care | Payer: BC Managed Care – PPO | Source: Ambulatory Visit | Attending: Internal Medicine | Admitting: Internal Medicine

## 2013-05-16 ENCOUNTER — Encounter (HOSPITAL_COMMUNITY): Payer: Self-pay

## 2013-05-16 VITALS — BP 90/58 | HR 57 | Wt 219.0 lb

## 2013-05-16 DIAGNOSIS — I5022 Chronic systolic (congestive) heart failure: Secondary | ICD-10-CM | POA: Insufficient documentation

## 2013-05-16 DIAGNOSIS — I2510 Atherosclerotic heart disease of native coronary artery without angina pectoris: Secondary | ICD-10-CM | POA: Insufficient documentation

## 2013-05-16 DIAGNOSIS — Z95810 Presence of automatic (implantable) cardiac defibrillator: Secondary | ICD-10-CM | POA: Insufficient documentation

## 2013-05-16 DIAGNOSIS — N183 Chronic kidney disease, stage 3 unspecified: Secondary | ICD-10-CM | POA: Insufficient documentation

## 2013-05-16 DIAGNOSIS — E785 Hyperlipidemia, unspecified: Secondary | ICD-10-CM | POA: Insufficient documentation

## 2013-05-16 DIAGNOSIS — Z7982 Long term (current) use of aspirin: Secondary | ICD-10-CM | POA: Insufficient documentation

## 2013-05-16 DIAGNOSIS — I252 Old myocardial infarction: Secondary | ICD-10-CM | POA: Insufficient documentation

## 2013-05-16 DIAGNOSIS — I509 Heart failure, unspecified: Secondary | ICD-10-CM | POA: Insufficient documentation

## 2013-05-16 NOTE — Progress Notes (Signed)
Remote ICD transmission.   

## 2013-05-16 NOTE — Patient Instructions (Signed)
Follow up in 6 months with Dr Haroldine Laws   Please get your lab work checked tomorrow.    Do the following things EVERYDAY: 1) Weigh yourself in the morning before breakfast. Write it down and keep it in a log. 2) Take your medicines as prescribed 3) Eat low salt foods-Limit salt (sodium) to 2000 mg per day.  4) Stay as active as you can everyday 5) Limit all fluids for the day to less than 2 liters

## 2013-05-16 NOTE — Progress Notes (Signed)
Patient ID: Derrick Lane, male   DOB: Apr 14, 1960, 53 y.o.   MRN: [id number]  EP: Dr Caryl Comes  HPI: Derrick Lane is a 53 y/o with h/o CAD s/p anterior MI in May 2011 treated with DES, LHC 08/03/2010: LAD 40% stent patent LCX 30-40% prox OM-1 stent patent RCA 40% EF 15%. S/P  ICD placement - St. Jude dual chamber ICD on Aug 05, 2010, and  Chronic systolic heart failure EF 30% 06/2011,  He not on Ace-I due to angioedema . He is not Spironolactone due to breast tenderness.  Does not tolerate hydralazine due to hypotension. (retired state trooper)  [place] 03/30/10 RA of 4, RV 33/1 (3), PA 40/17 (25), PCWP 12.  CO (Fick) 4.83 CI 2.42   CPX 07/08/10: pVO2 25.6 (74%) slope 34 ME/MVV 67% RER 1.17 Normal HR and BP response.   LHC 08/03/2010: LAD 40% stent patent LCX 30-40% prox OM-1 stent patent RCA 40% EF 15%.  06/13/11 ECHO 30% 11/22/12 ECHO 30-35%, RV good  He returns for follow up. Overall feels great. Denies SOB/PND/Orthopnea/CP.  Not exercising much. Started playing golf again. Weight at home 210-212 pounds. Continues to work with [employer].  Following low salt diet and drinking less than 2L a day. Compliant with medications.        ROS: All systems negative except as listed in HPI, PMH and Problem List.  Past Medical History  Diagnosis Date  . HLD (hyperlipidemia)   . GERD (gastroesophageal reflux disease)   . Chronic systolic heart failure     EF 10-15%.   Marland Kitchen CAD (coronary artery disease)     s/p Anterior MI 5/11. LAD stent  . Chronic kidney disease, stage III (moderate)   . Dual implantable cardiac defibrillator     St. Jude-Analyze ST study    Current Outpatient Prescriptions  Medication Sig Dispense Refill  . aspirin 81 MG tablet Take 81 mg by mouth daily.        . candesartan (ATACAND) 8 MG tablet Take 2 tablets (16 mg total) by mouth 2 (two) times daily.  120 tablet  6  . carvedilol (COREG) 12.5 MG tablet take 1 tablet by mouth twice a day  60 tablet  9  . cetirizine (ZYRTEC) 10 MG tablet Take 10  mg by mouth daily.        . clopidogrel (PLAVIX) 75 MG tablet take 1 tablet by mouth once daily  30 tablet  10  . CRESTOR 40 MG tablet take 1 tablet by mouth once daily  30 tablet  5  . eplerenone (INSPRA) 25 MG tablet take 1 tablet by mouth once daily  30 tablet  6  . furosemide (LASIX) 20 MG tablet take 1 tablet by mouth every morning  30 tablet  6  . nitroGLYCERIN (NITROSTAT) 0.4 MG SL tablet Place 1 tablet (0.4 mg total) under the tongue every 5 (five) minutes as needed for chest pain.  25 tablet  9   No current facility-administered medications for this encounter.     Filed Vitals:   05/16/13 1032  BP: 90/58  Pulse: 57  Weight: 219 lb (99.338 kg)  SpO2: 98%   PHYSICAL EXAM: General:  Well appearing. No resp difficulty HEENT: normal Neck: supple. JVP flat. Carotids 2+ bilaterally; no bruits. No lymphadenopathy or thryomegaly appreciated. Cor: PMI normal. Regular rate & rhythm. No rubs, murmurs.  Lungs: clear Abdomen: soft, nontender, nondistended. No hepatosplenomegaly. No bruits or masses. Good bowel sounds. Extremities: no cyanosis, clubbing, rash, edema Neuro: alert &  orientedx3, cranial nerves grossly intact. Moves all 4 extremities w/o difficulty. Affect pleasant.   ASSESSMENT & PLAN:  1) CAD:  - no s/s of ischemia. - continue ASA, statin, plavix and BB -Will check fasting  cholesterol tomomorow 2) Chronic systolic HF: ICM, EF 86-38% (11/2012) s/p ICD - NYHA I-II symptoms and volume status stable.  - Continue carvedilol at 12.5 twice a day. Will not titrate due to soft BP.  - Candesartan 16 mg BID at goal and eplerenone 25 mg goal. - Encouraged to start exercising again.  - Check CMET when he gets cholesterol tomorrow and he will fax results. .   - We discussed possible CPX however he would like to hold off.  Reinforced the need and importance of daily weights, a low sodium diet, and fluid restriction (less than 2 L a day). Instructed to call the HF clinic if weight  increases more than 3 lbs overnight or 5 lbs in a week.    Follow up in 6 months  Pearley Millington D Cesar Alf NP-C 10:47 AM

## 2013-05-17 ENCOUNTER — Encounter: Payer: Self-pay | Admitting: Internal Medicine

## 2013-05-19 ENCOUNTER — Other Ambulatory Visit: Payer: Self-pay | Admitting: Internal Medicine

## 2013-05-28 ENCOUNTER — Telehealth (HOSPITAL_COMMUNITY): Payer: Self-pay | Admitting: *Deleted

## 2013-05-28 NOTE — Telephone Encounter (Signed)
Pt's wife called concerned about pt, she states he has some type of virus, he is coughing and vomited all night on Sunday night, he is now just very weak and can barely walk to kitchen, she states his wt is down 8 lbs since Sunday, unable to check BP at home, he has already taken all of his AM meds, advised it sounds like pt is very dehydrated and needs to hold lasix and inspra for a couple of days, push fluids today and continue to monitor if gets worse should be evaluated in ER, she is agreeable

## 2013-06-07 ENCOUNTER — Encounter: Payer: Self-pay | Admitting: Cardiology

## 2013-07-17 ENCOUNTER — Other Ambulatory Visit: Payer: Self-pay | Admitting: Internal Medicine

## 2013-08-01 ENCOUNTER — Ambulatory Visit (INDEPENDENT_AMBULATORY_CARE_PROVIDER_SITE_OTHER): Payer: BC Managed Care – PPO | Admitting: *Deleted

## 2013-08-01 DIAGNOSIS — I5022 Chronic systolic (congestive) heart failure: Secondary | ICD-10-CM

## 2013-08-01 DIAGNOSIS — I429 Cardiomyopathy, unspecified: Secondary | ICD-10-CM

## 2013-08-01 LAB — MDC_IDC_ENUM_SESS_TYPE_INCLINIC
Battery Remaining Longevity: 81.6 mo
Brady Statistic RA Percent Paced: 0.67 %
Date Time Interrogation Session: 20150730093045
HIGH POWER IMPEDANCE MEASURED VALUE: 44 Ohm
HIGH POWER IMPEDANCE MEASURED VALUE: 44.4237
Implantable Pulse Generator Serial Number: 815371
Lead Channel Impedance Value: 325 Ohm
Lead Channel Impedance Value: 400 Ohm
Lead Channel Pacing Threshold Amplitude: 0.75 V
Lead Channel Pacing Threshold Amplitude: 1 V
Lead Channel Pacing Threshold Amplitude: 1 V
Lead Channel Pacing Threshold Pulse Width: 0.5 ms
Lead Channel Pacing Threshold Pulse Width: 0.5 ms
Lead Channel Pacing Threshold Pulse Width: 0.5 ms
Lead Channel Sensing Intrinsic Amplitude: 11.2 mV
Lead Channel Sensing Intrinsic Amplitude: 5 mV
Lead Channel Setting Sensing Sensitivity: 0.5 mV
MDC IDC MSMT LEADCHNL RA PACING THRESHOLD AMPLITUDE: 0.75 V
MDC IDC MSMT LEADCHNL RA PACING THRESHOLD PULSEWIDTH: 0.5 ms
MDC IDC SET LEADCHNL RA PACING AMPLITUDE: 2.5 V
MDC IDC SET LEADCHNL RV PACING AMPLITUDE: 2.5 V
MDC IDC SET LEADCHNL RV PACING PULSEWIDTH: 0.5 ms
MDC IDC STAT BRADY RV PERCENT PACED: 0 %
Zone Setting Detection Interval: 250 ms
Zone Setting Detection Interval: 285 ms
Zone Setting Detection Interval: 330 ms

## 2013-08-01 NOTE — Progress Notes (Signed)
ICD check in clinic. Normal device function. Thresholds and sensing consistent with previous device measurements. Impedance trends stable over time. No evidence of any ventricular arrhythmias. No mode switches. Histogram distribution appropriate for patient and level of activity. No changes made this session. Device programmed at appropriate safety margins. Device programmed to optimize intrinsic conduction. Estimated longevity 5.8 years. Pt enrolled in remote follow-up. Plan to check device every 3 months remotely and in office annually. Patient education completed including shock plan. Alert tones/vibration demonstrated for patient.  Merlin 11/04/13.  Checked by Armed forces training and education officer for research.

## 2013-08-27 ENCOUNTER — Other Ambulatory Visit (HOSPITAL_COMMUNITY): Payer: Self-pay | Admitting: Anesthesiology

## 2013-08-27 DIAGNOSIS — I5022 Chronic systolic (congestive) heart failure: Secondary | ICD-10-CM

## 2013-08-30 ENCOUNTER — Encounter: Payer: Self-pay | Admitting: Internal Medicine

## 2013-10-16 DIAGNOSIS — C4491 Basal cell carcinoma of skin, unspecified: Secondary | ICD-10-CM

## 2013-10-16 HISTORY — DX: Basal cell carcinoma of skin, unspecified: C44.91

## 2013-11-04 ENCOUNTER — Encounter: Payer: Self-pay | Admitting: Internal Medicine

## 2013-11-04 ENCOUNTER — Ambulatory Visit (INDEPENDENT_AMBULATORY_CARE_PROVIDER_SITE_OTHER): Payer: BC Managed Care – PPO | Admitting: *Deleted

## 2013-11-04 DIAGNOSIS — I429 Cardiomyopathy, unspecified: Secondary | ICD-10-CM

## 2013-11-04 LAB — MDC_IDC_ENUM_SESS_TYPE_REMOTE
Brady Statistic RA Percent Paced: 1 %
Brady Statistic RV Percent Paced: 1 %
HighPow Impedance: 41 Ohm
Implantable Pulse Generator Serial Number: 815371
Lead Channel Setting Pacing Amplitude: 2.5 V
Lead Channel Setting Sensing Sensitivity: 0.5 mV
MDC IDC MSMT LEADCHNL RA IMPEDANCE VALUE: 410 Ohm
MDC IDC MSMT LEADCHNL RA SENSING INTR AMPL: 4.6 mV
MDC IDC MSMT LEADCHNL RV IMPEDANCE VALUE: 310 Ohm
MDC IDC MSMT LEADCHNL RV SENSING INTR AMPL: 11.2 mV
MDC IDC SET LEADCHNL RA PACING AMPLITUDE: 2.5 V
MDC IDC SET LEADCHNL RV PACING PULSEWIDTH: 0.5 ms
MDC IDC SET ZONE DETECTION INTERVAL: 330 ms
Zone Setting Detection Interval: 250 ms
Zone Setting Detection Interval: 285 ms

## 2013-11-04 NOTE — Progress Notes (Signed)
Remote ICD transmission.   

## 2013-11-11 ENCOUNTER — Other Ambulatory Visit: Payer: Self-pay | Admitting: Internal Medicine

## 2013-11-11 DIAGNOSIS — I5022 Chronic systolic (congestive) heart failure: Secondary | ICD-10-CM

## 2013-12-13 ENCOUNTER — Other Ambulatory Visit (HOSPITAL_COMMUNITY): Payer: Self-pay | Admitting: Cardiology

## 2013-12-13 DIAGNOSIS — I509 Heart failure, unspecified: Secondary | ICD-10-CM

## 2013-12-13 MED ORDER — CANDESARTAN CILEXETIL 8 MG PO TABS: 16.0000 mg | ORAL_TABLET | Freq: Two times a day (BID) | ORAL | Status: DC

## 2013-12-18 ENCOUNTER — Other Ambulatory Visit (HOSPITAL_COMMUNITY): Payer: Self-pay | Admitting: Internal Medicine

## 2014-01-09 ENCOUNTER — Encounter: Payer: Self-pay | Admitting: Cardiology

## 2014-01-11 ENCOUNTER — Other Ambulatory Visit: Payer: Self-pay | Admitting: Cardiology

## 2014-01-13 ENCOUNTER — Other Ambulatory Visit (HOSPITAL_COMMUNITY): Payer: Self-pay | Admitting: *Deleted

## 2014-01-13 MED ORDER — CARVEDILOL 12.5 MG PO TABS: 12.5000 mg | ORAL_TABLET | Freq: Two times a day (BID) | ORAL | Status: DC

## 2014-01-31 ENCOUNTER — Other Ambulatory Visit (HOSPITAL_COMMUNITY): Payer: Self-pay | Admitting: Cardiology

## 2014-01-31 DIAGNOSIS — I509 Heart failure, unspecified: Secondary | ICD-10-CM

## 2014-01-31 MED ORDER — CANDESARTAN CILEXETIL 8 MG PO TABS: 16.0000 mg | ORAL_TABLET | Freq: Two times a day (BID) | ORAL | Status: DC

## 2014-02-10 ENCOUNTER — Ambulatory Visit (INDEPENDENT_AMBULATORY_CARE_PROVIDER_SITE_OTHER): Payer: BC Managed Care – PPO | Admitting: Internal Medicine

## 2014-02-10 ENCOUNTER — Encounter: Payer: Self-pay | Admitting: Internal Medicine

## 2014-02-10 VITALS — BP 110/70 | HR 54 | Ht 73.0 in | Wt 223.0 lb

## 2014-02-10 DIAGNOSIS — I5022 Chronic systolic (congestive) heart failure: Secondary | ICD-10-CM

## 2014-02-10 DIAGNOSIS — I429 Cardiomyopathy, unspecified: Secondary | ICD-10-CM

## 2014-02-10 DIAGNOSIS — Z4502 Encounter for adjustment and management of automatic implantable cardiac defibrillator: Secondary | ICD-10-CM

## 2014-02-10 LAB — BASIC METABOLIC PANEL
BUN: 15 mg/dL (ref 6–23)
CALCIUM: 9.6 mg/dL (ref 8.4–10.5)
CHLORIDE: 106 meq/L (ref 96–112)
CO2: 26 mEq/L (ref 19–32)
CREATININE: 1.14 mg/dL (ref 0.40–1.50)
GFR: 71.19 mL/min (ref >60.00)
Glucose, Bld: 114 mg/dL — ABNORMAL HIGH (ref 70–99)
Potassium: 3.9 mEq/L (ref 3.5–5.1)
SODIUM: 139 meq/L (ref 135–145)

## 2014-02-10 LAB — MDC_IDC_ENUM_SESS_TYPE_INCLINIC
Battery Remaining Longevity: 64 mo
Brady Statistic RA Percent Paced: 1 %
Brady Statistic RV Percent Paced: 1 %
HighPow Impedance: 42 Ohm
Lead Channel Impedance Value: 290 Ohm
Lead Channel Impedance Value: 400 Ohm
Lead Channel Pacing Threshold Amplitude: 1.25 V
Lead Channel Setting Pacing Amplitude: 2.5 V
Lead Channel Setting Sensing Sensitivity: 0.5 mV
MDC IDC MSMT LEADCHNL RA PACING THRESHOLD AMPLITUDE: 0.75 V
MDC IDC MSMT LEADCHNL RA PACING THRESHOLD PULSEWIDTH: 0.5 ms
MDC IDC MSMT LEADCHNL RA SENSING INTR AMPL: 4.3 mV
MDC IDC MSMT LEADCHNL RV PACING THRESHOLD PULSEWIDTH: 0.5 ms
MDC IDC MSMT LEADCHNL RV SENSING INTR AMPL: 10.8 mV
MDC IDC PG SERIAL: 815371
MDC IDC SET LEADCHNL RV PACING AMPLITUDE: 2.5 V
MDC IDC SET LEADCHNL RV PACING PULSEWIDTH: 0.5 ms
MDC IDC SET ZONE DETECTION INTERVAL: 285 ms
Zone Setting Detection Interval: 250 ms
Zone Setting Detection Interval: 330 ms

## 2014-02-10 NOTE — Progress Notes (Signed)
Electrophysiology Office Note   Date:  02/10/2014   ID:  Derrick Lane, DOB Jun 16, 1960, MRN [id number]  PCP:  Rory Percy, MD  Cardiologist:  [facility] Primary Electrophysiologist   Virl Axe, MD    Chief Complaint  Patient presents with  . Appointment    study     History of Present Illness: Derrick Lane is a 54 y.o. male is seen today in followup for    ICD implantation for primary prevention in the setting of ischemic heart disease.  His history of coronary artery disease with prior anterior MI in May 2011 treated with DES. Hospitalization c/b ARDS. EF on d/c 40-45%. March 2012 he developed acute congestive heart failure. Reassessment of his ejection fraction demonstrated that it was 10-15%. 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 He underwent ICD implantation December 2012       11/14 echo demonstrates an ejection fraction of 35-40%    Today, he denies * symptoms of palpitations, chest pain, shortness of breath, orthopnea, PND, lower extremity edema, claudication, dizziness, presyncope, syncope, bleeding, or neurologic sequela. The patient is tolerating medications without difficulties and is otherwise without complaint today.    Past Medical History  Diagnosis Date  . HLD (hyperlipidemia)   . GERD (gastroesophageal reflux disease)   . Chronic systolic heart failure     EF 10-15%.   Marland Kitchen CAD (coronary artery disease)     s/p Anterior MI 5/11. LAD stent  . Chronic kidney disease, stage III (moderate)   . Dual implantable cardiac defibrillator     St. Jude-Analyze ST study   History reviewed. No pertinent past surgical history.   Current Outpatient Prescriptions  Medication Sig Dispense Refill  . aspirin 81 MG tablet Take 81 mg by mouth daily.      . candesartan (ATACAND) 8 MG tablet Take 2 tablets (16 mg total) by mouth 2 (two) times daily. 360 tablet 3  .  carvedilol (COREG) 12.5 MG tablet Take 1 tablet (12.5 mg total) by mouth 2 (two) times daily. 60 tablet 2  . cetirizine (ZYRTEC) 10 MG tablet Take 10 mg by mouth daily.      . clopidogrel (PLAVIX) 75 MG tablet take 1 tablet by mouth once daily 30 tablet 10  . CRESTOR 40 MG tablet take 1 tablet by mouth once daily 30 tablet 5  . eplerenone (INSPRA) 25 MG tablet take 1 tablet by mouth once daily 30 tablet 6  . furosemide (LASIX) 20 MG tablet take 1 tablet by mouth every morning 30 tablet 6  . nitroGLYCERIN (NITROSTAT) 0.4 MG SL tablet Place 1 tablet (0.4 mg total) under the tongue every 5 (five) minutes as needed for chest pain. 25 tablet 9   No current facility-administered medications for this visit.    Allergies:   Beef-derived products; Peach flavor; Pork-derived products; Ramipril; and Spironolactone   Social History:  The patient  reports that he has never smoked. He does not have any smokeless tobacco history on file. He reports that he does not drink alcohol or use illicit drugs.   Family History:  The patient's family history includes Coronary artery disease in an other family member; Heart attack in his father and mother; Heart failure in his father; Hyperlipidemia in his mother; Hypertension in his father and mother.    ROS:  Please see the history of present illness and past medical history  Otherwise, review of systems is  negative .     PHYSICAL EXAM: VS:  BP 110/70 mmHg  Pulse 54  Ht 6\' 1"  (1.854 m)  Wt 223 lb (101.152 kg)  BMI 29.43 kg/m2 , BMI Body mass index is 29.43 kg/(m^2). GEN: Well nourished, well developed, in no acute distress HEENT: normal Neck:  JVD flat, carotid bruits, or masses Cardiac: REGULAR RATE and RHYTHM ;no murmurs, rubs, No S4  Back without kyphosis or CVAT Respiratory:  clear to auscultation bilaterally, normal work of breathing GI: soft, nontender, nondistended, + BS MS: no deformity or atrophy Extremities no clubbing cyanosis edema Skin: warm  and dry,  device pocket is well healed without teathering Neuro:  Strength and sensation are intact Psych: euthymic mood, full affect  EKG:  EKG is ordered today. The ekg ordered today shows 18.11/43  Device interrogation is reviewed today in detail.  See PaceArt for details.   Recent Labs: No results found for requested labs within last 365 days.    Lipid Panel     Component Value Date/Time   CHOL 167 11/01/2010 1440   TRIG 191* 11/01/2010 1440   HDL 59 11/01/2010 1440   CHOLHDL 2.8 11/01/2010 1440   VLDL 38 11/01/2010 1440   LDLCALC 70 11/01/2010 1440     Wt Readings from Last 3 Encounters:  02/10/14 223 lb (101.152 kg)  04/26/12 219 lb (99.338 kg)  06/13/11 213 lb (96.616 kg)      Other studies Reviewed: Additional studies/ records that were reviewed today include:    ASSESSMENT AND PLAN:  Iscemic cardiomyopathy  CHF chronic systolic  High risk medication surveillance  ICD  St Jude The patient's device was interrogated.  The information was reviewed. No changes were made in the programming.     Euvolemic continue current meds Without symptoms of ischemia   Needs evaluaton of K on high risk medicine  Current medicines are reviewed at length with the patient today.   The patient does not have concerns regarding his medicines.  The following changes were made today:  none  Labs/ tests ordered today include: bmet   Orders Placed This Encounter  Procedures  . Basic Metabolic Panel (BMET)  . Implantable device check  . EKG 12-Lead     Disposition:   FU with me  device clinic 1 year(s)  Signed, Virl Axe, MD  02/10/2014 9:56 AM     [facility] [address] [place] [address] [phone] (office) [phone] (fax)

## 2014-02-10 NOTE — Patient Instructions (Addendum)
Your physician recommends that you continue on your current medications as directed. Please refer to the Current Medication list given to you today.  Labs today: BMET  Remote monitoring is used to monitor your Pacemaker of ICD from home. This monitoring reduces the number of office visits required to check your device to one time per year. It allows [place] to keep an eye on the functioning of your device to ensure it is working properly. You are scheduled for a device check from home on 05/12/14. You may send your transmission at any time that day. If you have a wireless device, the transmission will be sent automatically. After your physician reviews your transmission, you will receive a postcard with your next transmission date.  Your physician wants you to follow-up in: 1 year with Dr. Caryl Comes.  You will receive a reminder letter in the mail two months in advance. If you don't receive a letter, please call our office to schedule the follow-up appointment.

## 2014-02-12 ENCOUNTER — Encounter: Payer: Self-pay | Admitting: Internal Medicine

## 2014-02-14 ENCOUNTER — Telehealth: Payer: Self-pay | Admitting: Internal Medicine

## 2014-02-14 NOTE — Telephone Encounter (Signed)
Follow up:    Pt returned this office call and asked for [facility].   Pt thinks it is for his lab results.   Please give him a call back.

## 2014-02-14 NOTE — Telephone Encounter (Signed)
Reviewed results with patient who verbalized understanding. 

## 2014-02-14 NOTE — Telephone Encounter (Signed)
-----   Message from Deboraha Sprang, MD sent at 02/12/2014 12:26 PM EST ----- Please Inform Patient that labs are normal  Thanks

## 2014-05-12 ENCOUNTER — Encounter: Payer: BC Managed Care – PPO | Admitting: *Deleted

## 2014-05-12 ENCOUNTER — Telehealth: Payer: Self-pay | Admitting: Cardiology

## 2014-05-12 NOTE — Telephone Encounter (Signed)
LMOVM reminding pt to send remote transmission.   

## 2014-05-16 ENCOUNTER — Encounter: Payer: Self-pay | Admitting: Cardiology

## 2014-05-30 ENCOUNTER — Other Ambulatory Visit (HOSPITAL_COMMUNITY): Payer: Self-pay | Admitting: Internal Medicine

## 2014-06-06 ENCOUNTER — Encounter: Payer: Self-pay | Admitting: *Deleted

## 2014-07-04 ENCOUNTER — Encounter: Payer: Self-pay | Admitting: *Deleted

## 2014-08-25 ENCOUNTER — Encounter: Payer: Self-pay | Admitting: *Deleted

## 2014-08-25 ENCOUNTER — Ambulatory Visit (INDEPENDENT_AMBULATORY_CARE_PROVIDER_SITE_OTHER): Payer: BC Managed Care – PPO | Admitting: *Deleted

## 2014-08-25 DIAGNOSIS — Z006 Encounter for examination for normal comparison and control in clinical research program: Secondary | ICD-10-CM

## 2014-08-25 DIAGNOSIS — Z95810 Presence of automatic (implantable) cardiac defibrillator: Secondary | ICD-10-CM

## 2014-08-25 DIAGNOSIS — I429 Cardiomyopathy, unspecified: Secondary | ICD-10-CM | POA: Diagnosis not present

## 2014-08-25 DIAGNOSIS — R001 Bradycardia, unspecified: Secondary | ICD-10-CM

## 2014-08-25 DIAGNOSIS — I5022 Chronic systolic (congestive) heart failure: Secondary | ICD-10-CM

## 2014-08-25 LAB — CUP PACEART INCLINIC DEVICE CHECK
Battery Remaining Longevity: 70.8 mo
Brady Statistic RA Percent Paced: 0.61 %
Brady Statistic RV Percent Paced: 0 %
Date Time Interrogation Session: 20160822103854
HIGH POWER IMPEDANCE MEASURED VALUE: 44.4585
HighPow Impedance: 44 Ohm
Lead Channel Impedance Value: 312.5 Ohm
Lead Channel Pacing Threshold Amplitude: 0.75 V
Lead Channel Pacing Threshold Pulse Width: 0.5 ms
Lead Channel Pacing Threshold Pulse Width: 0.5 ms
Lead Channel Pacing Threshold Pulse Width: 0.5 ms
Lead Channel Sensing Intrinsic Amplitude: 11.2 mV
Lead Channel Setting Pacing Amplitude: 2.5 V
Lead Channel Setting Pacing Pulse Width: 0.5 ms
MDC IDC MSMT LEADCHNL RA IMPEDANCE VALUE: 450 Ohm
MDC IDC MSMT LEADCHNL RA PACING THRESHOLD AMPLITUDE: 0.75 V
MDC IDC MSMT LEADCHNL RA SENSING INTR AMPL: 4.8 mV
MDC IDC MSMT LEADCHNL RV PACING THRESHOLD AMPLITUDE: 1.25 V
MDC IDC MSMT LEADCHNL RV PACING THRESHOLD AMPLITUDE: 1.25 V
MDC IDC MSMT LEADCHNL RV PACING THRESHOLD PULSEWIDTH: 0.5 ms
MDC IDC PG SERIAL: 815371
MDC IDC SET LEADCHNL RA PACING AMPLITUDE: 2 V
MDC IDC SET LEADCHNL RV SENSING SENSITIVITY: 0.5 mV
Zone Setting Detection Interval: 250 ms
Zone Setting Detection Interval: 285 ms
Zone Setting Detection Interval: 330 ms

## 2014-08-25 NOTE — Progress Notes (Signed)
ICD check in clinic by [place]. Normal device function. Thresholds and sensing consistent with previous device measurements. Impedance trends stable over time. No evidence of any ventricular arrhythmias. No mode switches. Histogram distribution appropriate for patient and level of activity. Disabled ST diagnostics. Pt notifiers reconfigured for appropriate alerts. Device programmed at appropriate safety margins---changed RA output from 2.5 to 2.0V. Device programmed to optimize intrinsic conduction. Estimated longevity 5.0-5.42yrs. Merlin 11/24/14 & ROV w/ SK in 45mo.

## 2014-08-25 NOTE — Progress Notes (Signed)
Analyze ST Final Research Visit- Pt seen in device clinic. Device checked by industry and research. No ST alerts. Research ST thresholds turned off per protocol. Pt released from research study [place] and transferred to [facility] for ongoing remote monitoring.  EKG WNL reviewed by Dr. Rayann Heman. BP 115/69 HR 56.

## 2014-08-29 ENCOUNTER — Other Ambulatory Visit (HOSPITAL_COMMUNITY): Payer: Self-pay | Admitting: Internal Medicine

## 2014-09-26 ENCOUNTER — Encounter: Payer: Self-pay | Admitting: Internal Medicine

## 2014-10-06 ENCOUNTER — Other Ambulatory Visit: Payer: Self-pay | Admitting: Internal Medicine

## 2014-10-27 ENCOUNTER — Other Ambulatory Visit: Payer: Self-pay | Admitting: Internal Medicine

## 2014-11-24 ENCOUNTER — Ambulatory Visit (INDEPENDENT_AMBULATORY_CARE_PROVIDER_SITE_OTHER): Payer: BC Managed Care – PPO | Admitting: *Deleted

## 2014-11-24 DIAGNOSIS — I429 Cardiomyopathy, unspecified: Secondary | ICD-10-CM | POA: Diagnosis not present

## 2014-11-24 NOTE — Progress Notes (Signed)
Remote ICD transmission.   

## 2014-11-25 LAB — CUP PACEART REMOTE DEVICE CHECK
Battery Remaining Longevity: 63 mo
Battery Remaining Percentage: 57 %
Battery Voltage: 2.93 V
Brady Statistic AS VP Percent: 1 %
Brady Statistic RA Percent Paced: 1 %
Date Time Interrogation Session: 20161121070729
HIGH POWER IMPEDANCE MEASURED VALUE: 43 Ohm
Implantable Lead Implant Date: 20120802
Implantable Lead Location: 753859
Implantable Lead Model: 185
Lead Channel Impedance Value: 300 Ohm
Lead Channel Sensing Intrinsic Amplitude: 11.2 mV
Lead Channel Setting Pacing Amplitude: 2 V
Lead Channel Setting Pacing Pulse Width: 0.5 ms
Lead Channel Setting Sensing Sensitivity: 0.5 mV
MDC IDC LEAD IMPLANT DT: 20120802
MDC IDC LEAD LOCATION: 753860
MDC IDC LEAD SERIAL: 348730
MDC IDC MSMT LEADCHNL RA IMPEDANCE VALUE: 400 Ohm
MDC IDC MSMT LEADCHNL RA SENSING INTR AMPL: 4.8 mV
MDC IDC SET LEADCHNL RV PACING AMPLITUDE: 2.5 V
MDC IDC STAT BRADY AP VP PERCENT: 0 %
MDC IDC STAT BRADY AP VS PERCENT: 1 %
MDC IDC STAT BRADY AS VS PERCENT: 99 %
MDC IDC STAT BRADY RV PERCENT PACED: 1 %
Pulse Gen Serial Number: 815371

## 2014-11-26 ENCOUNTER — Encounter: Payer: Self-pay | Admitting: Cardiology

## 2014-12-03 ENCOUNTER — Other Ambulatory Visit (HOSPITAL_COMMUNITY): Payer: Self-pay | Admitting: Internal Medicine

## 2015-02-25 ENCOUNTER — Other Ambulatory Visit (HOSPITAL_COMMUNITY): Payer: Self-pay | Admitting: Internal Medicine

## 2015-02-26 ENCOUNTER — Other Ambulatory Visit (HOSPITAL_COMMUNITY): Payer: Self-pay | Admitting: Internal Medicine

## 2015-03-04 ENCOUNTER — Encounter: Payer: Self-pay | Admitting: *Deleted

## 2015-03-09 ENCOUNTER — Encounter (HOSPITAL_COMMUNITY): Payer: Self-pay | Admitting: Internal Medicine

## 2015-03-09 ENCOUNTER — Ambulatory Visit (HOSPITAL_COMMUNITY)
Admission: RE | Admit: 2015-03-09 | Discharge: 2015-03-09 | Disposition: A | Discharge status: Home / Self Care | Payer: BC Managed Care – PPO | Source: Ambulatory Visit | Attending: Internal Medicine | Admitting: Internal Medicine

## 2015-03-09 VITALS — BP 116/74 | HR 66 | Wt 227.2 lb

## 2015-03-09 DIAGNOSIS — K219 Gastro-esophageal reflux disease without esophagitis: Secondary | ICD-10-CM | POA: Diagnosis not present

## 2015-03-09 DIAGNOSIS — E785 Hyperlipidemia, unspecified: Secondary | ICD-10-CM | POA: Insufficient documentation

## 2015-03-09 DIAGNOSIS — I5022 Chronic systolic (congestive) heart failure: Secondary | ICD-10-CM | POA: Diagnosis not present

## 2015-03-09 DIAGNOSIS — Z79899 Other long term (current) drug therapy: Secondary | ICD-10-CM | POA: Insufficient documentation

## 2015-03-09 DIAGNOSIS — Z955 Presence of coronary angioplasty implant and graft: Secondary | ICD-10-CM | POA: Insufficient documentation

## 2015-03-09 DIAGNOSIS — I2510 Atherosclerotic heart disease of native coronary artery without angina pectoris: Secondary | ICD-10-CM | POA: Insufficient documentation

## 2015-03-09 DIAGNOSIS — Z7902 Long term (current) use of antithrombotics/antiplatelets: Secondary | ICD-10-CM | POA: Insufficient documentation

## 2015-03-09 DIAGNOSIS — I252 Old myocardial infarction: Secondary | ICD-10-CM | POA: Diagnosis not present

## 2015-03-09 DIAGNOSIS — Z95810 Presence of automatic (implantable) cardiac defibrillator: Secondary | ICD-10-CM | POA: Diagnosis not present

## 2015-03-09 DIAGNOSIS — Z7982 Long term (current) use of aspirin: Secondary | ICD-10-CM | POA: Diagnosis not present

## 2015-03-09 DIAGNOSIS — N183 Chronic kidney disease, stage 3 (moderate): Secondary | ICD-10-CM | POA: Diagnosis not present

## 2015-03-09 NOTE — Progress Notes (Signed)
Advanced Heart Failure Medication Review by a Pharmacist  Does the patient  feel that his/her medications are working for him/her?  yes  Has the patient been experiencing any side effects to the medications prescribed?  no  Does the patient measure his/her own blood pressure or blood glucose at home?  no   Does the patient have any problems obtaining medications due to transportation or finances?   no  Understanding of regimen: excellent Understanding of indications: excellent Potential of compliance: excellent Patient understands to avoid NSAIDs. Patient understands to avoid decongestants.  Issues to address at subsequent visits: None   Pharmacist comments: 55 yo pleasant male presenting to clinic today for HF f/u.  Pt reports good adherence to his regimen and has no barriers to obtaining medications.  Pt reports that he does take an additional 20 mg Lasix PRN (rarely) for excess fluid.  Pt states that he is not currently experiencing any side effects.     Time with patient: 10 min Preparation and documentation time: 5 min Total time: 15 min

## 2015-03-09 NOTE — Addendum Note (Signed)
Encounter addended by: Harvie Junior, CMA on: 03/09/2015  2:33 PM<BR>     Documentation filed: Dx Association, Patient Instructions Section, Orders

## 2015-03-09 NOTE — Addendum Note (Signed)
Encounter addended by: Kem Parkinson, RPH on: 03/09/2015  2:29 PM<BR>     Documentation filed: Notes Section

## 2015-03-09 NOTE — Patient Instructions (Signed)
Echocardiogram in [place].   Follow up with Dr.Bensimhon in 6 months

## 2015-03-09 NOTE — Progress Notes (Signed)
ADVANCED HF CLINIC NOTE  Patient ID: Derrick Lane, male   DOB: [birth year], 55 y.o.   MRN: [id number]  EP: Dr Caryl Comes  HPI: Derrick Lane is a 55 y/o with h/o CAD s/p anterior MI in May 2011 treated with DES, LHC 08/03/2010: LAD 40% stent patent LCX 30-40% prox OM-1 stent patent RCA 40% EF 15%. S/P  ICD placement - St. Jude dual chamber ICD on Aug 05, 2010, and  Chronic systolic heart failure EF 30% 06/2011,  He not on Ace-I due to angioedema . He is not Spironolactone due to breast tenderness.  Does not tolerate hydralazine due to hypotension. (retired state trooper)  [place] 03/30/10 RA of 4, RV 33/1 (3), PA 40/17 (25), PCWP 12.  CO (Fick) 4.83 CI 2.42   CPX 07/08/10: pVO2 25.6 (74%) slope 34 ME/MVV 67% RER 1.17 Normal HR and BP response.   LHC 08/03/2010: LAD 40% stent patent LCX 30-40% prox OM-1 stent patent RCA 40% EF 15%.  06/13/11 ECHO 30% 11/22/12 ECHO 35-40%, RV good  He returns for follow up. We haven't seen him in over 2 years. Overall feels great. Denies SOB/PND/Orthopnea/CP. Has mild DOE if he hurries. Has gained about 15 pound over past 2 years.  Following low salt diet and drinking less than 2L a day. Compliant with medications. Spending a lot of time with grandkids. He is active but not exercising regularly.      ROS: All systems negative except as listed in HPI, PMH and Problem List.  Past Medical History  Diagnosis Date  . HLD (hyperlipidemia)   . GERD (gastroesophageal reflux disease)   . Chronic systolic heart failure (HCC)     EF 10-15%.   [place] CAD (coronary artery disease)     s/p Anterior MI 5/11. LAD stent  . Chronic kidney disease, stage III (moderate)   . Dual implantable cardiac defibrillator     St. Jude-Analyze ST study    Current Outpatient Prescriptions  Medication Sig Dispense Refill  . aspirin 81 MG tablet Take 81 mg by mouth daily.      . candesartan (ATACAND) 8 MG tablet TAKE 2 TABLETS BY MOUTH TWICE A DAY 120 tablet 5  . carvedilol (COREG) 12.5 MG tablet TAKE 1  TABLET BY MOUTH TWICE DAILY 60 tablet 2  . cetirizine (ZYRTEC) 10 MG tablet Take 10 mg by mouth daily.      . clopidogrel (PLAVIX) 75 MG tablet TAKE 1 TABLET BY MOUTH ONCE DAILY 30 tablet 2  . eplerenone (INSPRA) 25 MG tablet TAKE 1 TABLET BY MOUTH ONCE DAILY 30 tablet 5  . furosemide (LASIX) 20 MG tablet TAKE 1 TABLET BY MOUTH EVERY MORNING 30 tablet 6  . nitroGLYCERIN (NITROSTAT) 0.4 MG SL tablet Place 1 tablet (0.4 mg total) under the tongue every 5 (five) minutes as needed for chest pain. 25 tablet 9  . rosuvastatin (CRESTOR) 40 MG tablet TAKE 1 TABLET BY MOUTH ONCE DAILY 30 tablet 5   No current facility-administered medications for this encounter.     Filed Vitals:   03/09/15 1350  BP: 116/74  Pulse: 66  Weight: 227 lb 4 oz (103.08 kg)  SpO2: 98%   PHYSICAL EXAM: General:  Well appearing. No resp difficulty HEENT: normal Neck: supple. JVP flat. Carotids 2+ bilaterally; no bruits. No lymphadenopathy or thryomegaly appreciated. Cor: PMI normal. Regular rate & rhythm. No rubs, murmurs.  Lungs: clear Abdomen: soft, nontender, nondistended. No hepatosplenomegaly. No bruits or masses. Good bowel sounds. Extremities: no cyanosis, clubbing, rash,  edema Neuro: alert & orientedx3, cranial nerves grossly intact. Moves all 4 extremities w/o difficulty. Affect pleasant.   ASSESSMENT & PLAN:  1) CAD:  - no s/s of ischemia. - continue ASA, statin, plavix and BB - Lipids followed by Dr. Nadara Mustard 2) Chronic systolic HF: ICM, EF 123456 (11/2012) s/p ICD - NYHA I-II symptoms and volume status stable.  - Continue carvedilol at 12.5 twice a day. HR too low to adjust.  - Candesartan 16 mg BID at goal and eplerenone 25 mg goal. Not candidate for Entresto due to angioedema with ACE-I - I have cleared him to apply for [place] as he has been stable and not had any arrhythmias.   Follow up in 6 months   Bensimhon, Daniel MD  2:10 PM

## 2015-03-17 ENCOUNTER — Ambulatory Visit (HOSPITAL_COMMUNITY)
Admission: RE | Admit: 2015-03-17 | Discharge: 2015-03-17 | Disposition: A | Discharge status: Home / Self Care | Payer: BC Managed Care – PPO | Source: Ambulatory Visit | Attending: Internal Medicine | Admitting: Internal Medicine

## 2015-03-17 DIAGNOSIS — I429 Cardiomyopathy, unspecified: Secondary | ICD-10-CM | POA: Insufficient documentation

## 2015-03-17 DIAGNOSIS — Z95810 Presence of automatic (implantable) cardiac defibrillator: Secondary | ICD-10-CM | POA: Diagnosis not present

## 2015-03-17 DIAGNOSIS — I2510 Atherosclerotic heart disease of native coronary artery without angina pectoris: Secondary | ICD-10-CM | POA: Diagnosis not present

## 2015-03-17 DIAGNOSIS — I252 Old myocardial infarction: Secondary | ICD-10-CM | POA: Insufficient documentation

## 2015-03-17 DIAGNOSIS — I5022 Chronic systolic (congestive) heart failure: Secondary | ICD-10-CM | POA: Insufficient documentation

## 2015-03-17 LAB — ECHOCARDIOGRAM COMPLETE
A4CEF: 50 %
AORTIC ROOT 2D: 39 mm
CHL CUP LA SIZE INDEX: 1.85 mm/m2
CHL CUP LA VOL 2D INDEX: 22.6 mL/m2
CHL CUP LA VOL 2D: 52.6 mL
CHL CUP SV INDEX: 25.8 mL/m2
E decel time: 229 msec
EERAT: 23.2
ESTIMATED CVP: 3 mm HG
FS: 21 % — AB (ref 28–44)
IVS/LV PW RATIO, ED: 0.9
LA ID, A-P, ES: 43 mm
LA diam end sys: 43 mm
LAVOL: 60.3 mL
LAVOLIN: 25.9 mL/m2
LV PW d: 8.15 mm — AB (ref 0.6–1.1)
LV PW s: 8.15 mm
LV TDI E'LATERAL: 2.72 cm/s
LV TDI E'MEDIAL: 5.11 cm/s
LV dias vol index: 55 mL/m2
LV dias vol: 127 mL (ref 62–150)
LV sys vol index: 29 mL/m2
LV sys vol: 127 mL — AB (ref 21–61)
LVIDD: 67.4 mm — AB (ref 3.5–6.0)
LVIDS: 53 mm — AB (ref 2.1–4.0)
LVOT area: 3.8 cm2
LVOTD: 22 mm
MV Dec: 229 ms
MV pk A vel: 107 cm/s
MVPKEVEL: 63.1 cm/s
RV TAPSE: 17.8 mm
Simpson's disk: 47
Stroke v: 60 ml
TRMAXVEL: 220 cm/s
TV PEAK RV-RA GRADIENT: 19 cm/s

## 2015-03-25 DIAGNOSIS — C4492 Squamous cell carcinoma of skin, unspecified: Secondary | ICD-10-CM

## 2015-03-25 HISTORY — DX: Squamous cell carcinoma of skin, unspecified: C44.92

## 2015-03-28 ENCOUNTER — Other Ambulatory Visit: Payer: Self-pay | Admitting: Internal Medicine

## 2015-04-25 ENCOUNTER — Other Ambulatory Visit: Payer: Self-pay | Admitting: Internal Medicine

## 2015-05-24 ENCOUNTER — Other Ambulatory Visit (HOSPITAL_COMMUNITY): Payer: Self-pay | Admitting: Internal Medicine

## 2015-08-17 ENCOUNTER — Other Ambulatory Visit (HOSPITAL_COMMUNITY): Payer: Self-pay | Admitting: Internal Medicine

## 2015-09-01 ENCOUNTER — Other Ambulatory Visit (HOSPITAL_COMMUNITY): Payer: Self-pay | Admitting: Internal Medicine

## 2015-09-10 ENCOUNTER — Other Ambulatory Visit: Payer: Self-pay | Admitting: Internal Medicine

## 2015-10-09 ENCOUNTER — Telehealth: Payer: Self-pay | Admitting: Cardiology

## 2015-10-09 ENCOUNTER — Ambulatory Visit (INDEPENDENT_AMBULATORY_CARE_PROVIDER_SITE_OTHER): Payer: BC Managed Care – PPO | Admitting: *Deleted

## 2015-10-09 DIAGNOSIS — I429 Cardiomyopathy, unspecified: Secondary | ICD-10-CM

## 2015-10-09 NOTE — Telephone Encounter (Signed)
Spoke w/ pt and requested that he send a manual transmission b/c his home monitor has not updated in at least 8 days. Also, scheduled for pt for a follow up w/ Md on 12-29-2015 at 9:15 AM. Pt agreed to this appt. Pt is going to call tech support for help trouble shooting his monitor.

## 2015-10-13 ENCOUNTER — Encounter: Payer: Self-pay | Admitting: Cardiology

## 2015-10-13 NOTE — Progress Notes (Signed)
Remote ICD transmission.   

## 2015-10-18 LAB — CUP PACEART REMOTE DEVICE CHECK
Battery Remaining Longevity: 56 mo
Battery Remaining Percentage: 50 %
Battery Voltage: 2.9 V
Brady Statistic AP VP Percent: 1 %
Brady Statistic AP VS Percent: 1 %
Brady Statistic AS VP Percent: 1 %
Brady Statistic AS VS Percent: 99 %
Brady Statistic RA Percent Paced: 1 %
Brady Statistic RV Percent Paced: 1 %
Date Time Interrogation Session: 20171006211448
HighPow Impedance: 41 Ohm
Implantable Lead Implant Date: 20120802
Implantable Lead Implant Date: 20120802
Implantable Lead Location: 753859
Implantable Lead Location: 753860
Implantable Lead Model: 185
Implantable Lead Serial Number: 348730
Lead Channel Impedance Value: 300 Ohm
Lead Channel Impedance Value: 400 Ohm
Lead Channel Pacing Threshold Amplitude: 0.75 V
Lead Channel Pacing Threshold Amplitude: 1.25 V
Lead Channel Pacing Threshold Pulse Width: 0.5 ms
Lead Channel Pacing Threshold Pulse Width: 0.5 ms
Lead Channel Sensing Intrinsic Amplitude: 11 mV
Lead Channel Sensing Intrinsic Amplitude: 5 mV
Lead Channel Setting Pacing Amplitude: 2 V
Lead Channel Setting Pacing Amplitude: 2.5 V
Lead Channel Setting Pacing Pulse Width: 0.5 ms
Lead Channel Setting Sensing Sensitivity: 0.5 mV
Pulse Gen Serial Number: 815371

## 2015-10-22 ENCOUNTER — Telehealth: Payer: Self-pay | Admitting: Cardiology

## 2015-10-22 NOTE — Telephone Encounter (Signed)
Spoke w/ pt and requested that he send a manual transmission b/c his home monitor has not updated in at least 8 days.   

## 2015-10-30 ENCOUNTER — Other Ambulatory Visit: Payer: Self-pay | Admitting: Internal Medicine

## 2015-11-04 ENCOUNTER — Other Ambulatory Visit: Payer: Self-pay | Admitting: Internal Medicine

## 2015-11-05 ENCOUNTER — Telehealth: Payer: Self-pay | Admitting: Cardiology

## 2015-11-05 NOTE — Telephone Encounter (Signed)
LMOVM requesting that pt send manual transmission b/c home monitor has not updated in at least 8 days.   

## 2015-11-12 ENCOUNTER — Telehealth: Payer: Self-pay | Admitting: Cardiology

## 2015-11-12 NOTE — Telephone Encounter (Signed)
LMOVM requesting that pt send manual transmission b/c home monitor has not updated in at least 7 days.    

## 2015-11-19 ENCOUNTER — Telehealth: Payer: Self-pay | Admitting: Cardiology

## 2015-11-19 NOTE — Telephone Encounter (Signed)
LMOVM requesting that pt send manual transmission b/c home monitor has not updated in at least 7 days.    

## 2015-11-24 ENCOUNTER — Other Ambulatory Visit (HOSPITAL_COMMUNITY): Payer: Self-pay | Admitting: Internal Medicine

## 2015-12-17 ENCOUNTER — Encounter: Payer: Self-pay | Admitting: Internal Medicine

## 2015-12-24 ENCOUNTER — Other Ambulatory Visit (HOSPITAL_COMMUNITY): Payer: Self-pay | Admitting: Internal Medicine

## 2015-12-29 ENCOUNTER — Encounter (INDEPENDENT_AMBULATORY_CARE_PROVIDER_SITE_OTHER): Payer: Self-pay

## 2015-12-29 ENCOUNTER — Ambulatory Visit (INDEPENDENT_AMBULATORY_CARE_PROVIDER_SITE_OTHER): Payer: BC Managed Care – PPO | Admitting: Internal Medicine

## 2015-12-29 VITALS — BP 118/80 | HR 54 | Ht 73.0 in | Wt 221.0 lb

## 2015-12-29 DIAGNOSIS — I5022 Chronic systolic (congestive) heart failure: Secondary | ICD-10-CM | POA: Diagnosis not present

## 2015-12-29 DIAGNOSIS — Z95810 Presence of automatic (implantable) cardiac defibrillator: Secondary | ICD-10-CM | POA: Diagnosis not present

## 2015-12-29 DIAGNOSIS — I255 Ischemic cardiomyopathy: Secondary | ICD-10-CM | POA: Diagnosis not present

## 2015-12-29 LAB — CUP PACEART INCLINIC DEVICE CHECK
Date Time Interrogation Session: 20171226143439
HIGH POWER IMPEDANCE MEASURED VALUE: 46 Ohm
Implantable Lead Implant Date: 20120802
Implantable Lead Location: 753859
Implantable Pulse Generator Implant Date: 20120802
Lead Channel Impedance Value: 330 Ohm
Lead Channel Pacing Threshold Amplitude: 0.75 V
Lead Channel Pacing Threshold Pulse Width: 0.5 ms
Lead Channel Sensing Intrinsic Amplitude: 11.2 mV
Lead Channel Sensing Intrinsic Amplitude: 5 mV
MDC IDC LEAD IMPLANT DT: 20120802
MDC IDC LEAD LOCATION: 753860
MDC IDC LEAD MODEL: 185
MDC IDC LEAD SERIAL: 348730
MDC IDC MSMT LEADCHNL RA IMPEDANCE VALUE: 430 Ohm
MDC IDC MSMT LEADCHNL RV PACING THRESHOLD AMPLITUDE: 1 V
MDC IDC MSMT LEADCHNL RV PACING THRESHOLD PULSEWIDTH: 0.5 ms
MDC IDC SET LEADCHNL RA PACING AMPLITUDE: 2 V
MDC IDC SET LEADCHNL RV PACING AMPLITUDE: 2.5 V
MDC IDC SET LEADCHNL RV PACING PULSEWIDTH: 0.5 ms
MDC IDC SET LEADCHNL RV SENSING SENSITIVITY: 0.5 mV
MDC IDC STAT BRADY RA PERCENT PACED: 0.54 %
MDC IDC STAT BRADY RV PERCENT PACED: 0 %
Pulse Gen Serial Number: 815371

## 2015-12-29 LAB — BASIC METABOLIC PANEL
BUN: 11 mg/dL (ref 7–25)
CALCIUM: 9.5 mg/dL (ref 8.6–10.3)
CHLORIDE: 104 mmol/L (ref 98–110)
CO2: 27 mmol/L (ref 20–31)
CREATININE: 1.09 mg/dL (ref 0.70–1.33)
Glucose, Bld: 100 mg/dL — ABNORMAL HIGH (ref 65–99)
Potassium: 4.6 mmol/L (ref 3.5–5.3)
SODIUM: 141 mmol/L (ref 135–146)

## 2015-12-29 NOTE — Patient Instructions (Signed)
Medication Instructions: - Your physician recommends that you continue on your current medications as directed. Please refer to the Current Medication list given to you today.  Labwork: - Your physician recommends that you have lab work today: [employer]  Procedures/Testing: - none ordered  Follow-Up: - Remote monitoring is used to monitor your Pacemaker of ICD from home. This monitoring reduces the number of office visits required to check your device to one time per year. It allows [place] to keep an eye on the functioning of your device to ensure it is working properly. You are scheduled for a device check from home on 03/29/16. You may send your transmission at any time that day. If you have a wireless device, the transmission will be sent automatically. After your physician reviews your transmission, you will receive a postcard with your next transmission date.  - Your physician wants you to follow-up in: 6 months with Dr. Haroldine Laws in the CHF clinic & 1 year with Dr. Caryl Comes. You will receive a reminder letter in the mail two months in advance. If you don't receive a letter, please call our office to schedule the follow-up appointment.  Any Additional Special Instructions Will Be Listed Below (If Applicable).     If you need a refill on your cardiac medications before your next appointment, please call your pharmacy.

## 2015-12-29 NOTE — Progress Notes (Signed)
Electrophysiology Office Note   Date:  12/29/2015   ID:  Derrick Lane, DOB 03-Jan-1961, MRN [id number]  PCP:  Rory Percy, MD  Cardiologist:  [facility] Primary Electrophysiologist   Virl Axe, MD    No chief complaint on file.    History of Present Illness: Derrick Lane is a 55 y.o. male is seen today in followup for ICD implantation for primary prevention in the setting of ischemic heart disease.  His history of coronary artery disease with prior anterior MI in May 2011 treated with DES. Hospitalization c/b ARDS. EF on d/c 40-45%. March 2012 he developed acute congestive heart failure. Reassessment of his ejection fraction demonstrated that it was 10-15%. 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 He underwent ICD implantation December 2012   DATE TEST    3/12    echo   EF 10.-15 %   6/13    Echo   EF 30 %   3/17 Echo Echo 35%    Seen by CHF clinic  Lipids followed by Dr Nadara Mustard   No significant SOB or CP   Date      2/16    Cr  1.14      K   3.9     \        Past Medical History:  Diagnosis Date  . CAD (coronary artery disease)    s/p Anterior MI 5/11. LAD stent  . Chronic kidney disease, stage III (moderate)   . Chronic systolic heart failure (HCC)    EF 10-15%.   . Dual implantable cardiac defibrillator    St. Jude-Analyze ST study  . GERD (gastroesophageal reflux disease)   . HLD (hyperlipidemia)    No past surgical history on file.   Current Outpatient Prescriptions  Medication Sig Dispense Refill  . aspirin 81 MG tablet Take 81 mg by mouth daily.      . candesartan (ATACAND) 8 MG tablet TAKE 2 TABLETS BY MOUTH TWICE A DAY 120 tablet 5  . carvedilol (COREG) 12.5 MG tablet TAKE 1 TABLET BY MOUTH TWICE DAILY 60 tablet 3  . cetirizine (ZYRTEC) 10 MG tablet Take 10 mg by mouth daily.      . clopidogrel (PLAVIX) 75 MG tablet TAKE 1 TABLET BY MOUTH  ONCE DAILY 30 tablet 2  . eplerenone (INSPRA) 25 MG tablet TAKE 1 TABLET BY MOUTH ONCE DAILY 30 tablet 5  . furosemide (LASIX) 20 MG tablet TAKE 1 TABLET BY MOUTH EVERY MORNING 30 tablet 6  . nitroGLYCERIN (NITROSTAT) 0.4 MG SL tablet Place 1 tablet (0.4 mg total) under the tongue every 5 (five) minutes as needed for chest pain. 25 tablet 9  . predniSONE (DELTASONE) 20 MG tablet Take 20 mg by mouth daily as needed for anaphylaxis.  1  . rosuvastatin (CRESTOR) 40 MG tablet TAKE 1 TABLET BY MOUTH ONCE DAILY 30 tablet 5   No current facility-administered medications for this visit.     Allergies:   Beef-derived products; Peach flavor; Pork-derived products; Ramipril; and Spironolactone   Social History:  The patient  reports that he has never smoked. He has never used smokeless tobacco. He reports that he does not drink alcohol or use drugs.   Family History:  The patient's family history includes Heart attack in his father and mother; Heart failure in his father; Hyperlipidemia in his mother; Hypertension in his father and mother.    ROS:  Please see the history of present illness and past medical history  Otherwise, review of systems is negative .     PHYSICAL EXAM: VS:  BP 118/80   Pulse (!) 54   Ht 6\' 1"  (1.854 m)   Wt 221 lb (100.2 kg)   BMI 29.16 kg/m  , BMI Body mass index is 29.16 kg/m. GEN: Well nourished, well developed, in no acute distress  HEENT: normal  Neck:  JVD flat, carotid bruits, or masses Cardiac: REGULAR RATE and RHYTHM ;no murmurs, rubs, No S4  Back without kyphosis or CVAT Respiratory:  clear to auscultation bilaterally, normal work of breathing GI: soft, nontender, nondistended, + BS MS: no deformity or atrophy  Extremities no clubbing cyanosis edema Skin: warm and dry,  device pocket is well healed without teathering Neuro:  Strength and sensation are intact Psych: euthymic mood, full affect  EKG:  EKG is ordered today. [id number] 18/11/42 Septal MI     Device interrogation is reviewed today in detail.  See PaceArt for details.   Recent Labs: No results found for requested labs within last 8760 hours.    Lipid Panel     Component Value Date/Time   CHOL 167 11/01/2010 1440   TRIG 191 (H) 11/01/2010 1440   HDL 59 11/01/2010 1440   CHOLHDL 2.8 11/01/2010 1440   VLDL 38 11/01/2010 1440   LDLCALC 70 11/01/2010 1440     Wt Readings from Last 3 Encounters:  12/29/15 221 lb (100.2 kg)  03/09/15 227 lb 4 oz (103.1 kg)  02/10/14 223 lb (101.2 kg)      Other studies Reviewed: Additional studies/ records that were reviewed today include:    ASSESSMENT AND PLAN:  Ischemic cardiomyopathy  CHF chronic systolic  High risk medication surveillance  ICD  St Jude The patient's device was interrogated.  The information was reviewed. No changes were made in the programming.      Without symptoms of ischemia  No intercurrent Ventricular tachycardia  Will check labs And asked him to followup with PCP re lipids, last ones inour records 2012 :((((   Labs/ tests ordered today include: bmet   Orders Placed This Encounter  Procedures  . Basic Metabolic Panel (BMET)  . EKG 12-Lead     Disposition:   FU with me  device clinic 1 year(s)  Signed, Virl Axe, MD  12/29/2015 10:29 AM     [facility] [address] [place] [address] [phone] (office) [phone] (fax)

## 2016-01-14 ENCOUNTER — Telehealth: Payer: Self-pay | Admitting: Cardiology

## 2016-01-14 NOTE — Telephone Encounter (Signed)
Spoke w/ pt and requested that he send a manual transmission b/c his home monitor has not updated in at least 7 days.   

## 2016-02-11 ENCOUNTER — Telehealth: Payer: Self-pay | Admitting: Cardiology

## 2016-02-11 NOTE — Telephone Encounter (Signed)
LMOVM requesting that pt send manual transmission b/c home monitor has not updated in at least 7 days.    

## 2016-02-16 ENCOUNTER — Other Ambulatory Visit (HOSPITAL_COMMUNITY): Payer: Self-pay | Admitting: Internal Medicine

## 2016-02-18 ENCOUNTER — Telehealth: Payer: Self-pay | Admitting: Cardiology

## 2016-02-18 NOTE — Telephone Encounter (Signed)
Spoke w/ pt and requested that he send a manual transmission b/c his home monitor has not updated in at least 7 days.   

## 2016-02-25 ENCOUNTER — Telehealth: Payer: Self-pay | Admitting: Cardiology

## 2016-02-25 NOTE — Telephone Encounter (Signed)
LMOVM requesting that pt send manual transmission b/c home monitor has not updated in at least 7 days. Pt last office visit w/ EP MD was 12/2015. I have called pt 9 times about his disconnected monitor.

## 2016-03-13 ENCOUNTER — Other Ambulatory Visit (HOSPITAL_COMMUNITY): Payer: Self-pay | Admitting: Internal Medicine

## 2016-03-29 ENCOUNTER — Encounter: Payer: BC Managed Care – PPO | Admitting: *Deleted

## 2016-03-29 ENCOUNTER — Telehealth: Payer: Self-pay | Admitting: Cardiology

## 2016-03-29 NOTE — Telephone Encounter (Signed)
LMOVM reminding pt to send remote transmission.   

## 2016-04-01 ENCOUNTER — Encounter: Payer: Self-pay | Admitting: Cardiology

## 2016-04-15 ENCOUNTER — Other Ambulatory Visit: Payer: Self-pay | Admitting: Internal Medicine

## 2016-05-21 ENCOUNTER — Other Ambulatory Visit (HOSPITAL_COMMUNITY): Payer: Self-pay | Admitting: Internal Medicine

## 2016-06-08 ENCOUNTER — Telehealth: Payer: Self-pay | Admitting: Cardiology

## 2016-06-08 NOTE — Telephone Encounter (Signed)
LMOVM requesting that pt send manual transmission b/c home monitor has not updated in at least 7 days.    

## 2016-06-15 ENCOUNTER — Telehealth: Payer: Self-pay | Admitting: Cardiology

## 2016-06-15 NOTE — Telephone Encounter (Signed)
LMOVM requesting that pt send manual transmission b/c home monitor has not updated in at least 7 days.    

## 2016-06-19 ENCOUNTER — Other Ambulatory Visit: Payer: Self-pay | Admitting: Internal Medicine

## 2016-06-20 NOTE — Telephone Encounter (Signed)
FOLLOWED BY Caryl Comes

## 2016-06-21 NOTE — Telephone Encounter (Signed)
These medications have been refilled by Dr Haroldine Laws but his office sent the refill to this office with a note that the patient is followed by Dr Caryl Comes. Okay to refill under Dr Caryl Comes? Please advise. Thanks, MI

## 2016-06-21 NOTE — Telephone Encounter (Signed)
Dr. Haroldine Laws has been filling these and the patient has an appointment with him on 07/12/16. They should be able to refill his meds as Dr. Caryl Comes is just managing his device.  Thanks!

## 2016-07-12 ENCOUNTER — Ambulatory Visit (HOSPITAL_COMMUNITY)
Admission: RE | Admit: 2016-07-12 | Discharge: 2016-07-12 | Disposition: A | Discharge status: Home / Self Care | Payer: BC Managed Care – PPO | Source: Ambulatory Visit | Attending: Internal Medicine | Admitting: Internal Medicine

## 2016-07-12 ENCOUNTER — Encounter (HOSPITAL_COMMUNITY): Payer: Self-pay | Admitting: Internal Medicine

## 2016-07-12 VITALS — BP 134/84 | HR 57 | Wt 219.2 lb

## 2016-07-12 DIAGNOSIS — K219 Gastro-esophageal reflux disease without esophagitis: Secondary | ICD-10-CM | POA: Diagnosis not present

## 2016-07-12 DIAGNOSIS — I252 Old myocardial infarction: Secondary | ICD-10-CM | POA: Diagnosis not present

## 2016-07-12 DIAGNOSIS — N183 Chronic kidney disease, stage 3 (moderate): Secondary | ICD-10-CM | POA: Diagnosis not present

## 2016-07-12 DIAGNOSIS — I959 Hypotension, unspecified: Secondary | ICD-10-CM | POA: Insufficient documentation

## 2016-07-12 DIAGNOSIS — Z95810 Presence of automatic (implantable) cardiac defibrillator: Secondary | ICD-10-CM | POA: Insufficient documentation

## 2016-07-12 DIAGNOSIS — E785 Hyperlipidemia, unspecified: Secondary | ICD-10-CM | POA: Diagnosis not present

## 2016-07-12 DIAGNOSIS — Z7902 Long term (current) use of antithrombotics/antiplatelets: Secondary | ICD-10-CM | POA: Insufficient documentation

## 2016-07-12 DIAGNOSIS — Z7982 Long term (current) use of aspirin: Secondary | ICD-10-CM | POA: Insufficient documentation

## 2016-07-12 DIAGNOSIS — I2510 Atherosclerotic heart disease of native coronary artery without angina pectoris: Secondary | ICD-10-CM | POA: Diagnosis present

## 2016-07-12 DIAGNOSIS — I5022 Chronic systolic (congestive) heart failure: Secondary | ICD-10-CM

## 2016-07-12 NOTE — Patient Instructions (Signed)
Follow up with Dr.Bensimhon in 1 year

## 2016-07-12 NOTE — Progress Notes (Signed)
ADVANCED HF CLINIC NOTE  Patient ID: Derrick Lane, male   DOB: January 21, 1960, 56 y.o.   MRN: [id number]  EP: Dr Caryl Comes  HPI: Derrick Lane is a 56 y/o with h/o CAD s/p anterior MI in May 2011 treated with DES, LHC 08/03/2010: LAD 40% stent patent LCX 30-40% prox OM-1 stent patent RCA 40% EF 15%. S/P  ICD placement - St. Jude dual chamber ICD on Aug 05, 2010, and  Chronic systolic heart failure EF 30% 06/2011,  He not on Ace-I due to angioedema . He is not Spironolactone due to breast tenderness.  Does not tolerate hydralazine due to hypotension. (retired state trooper)  [place] 03/30/10 RA of 4, RV 33/1 (3), PA 40/17 (25), PCWP 12.  CO (Fick) 4.83 CI 2.42   CPX 07/08/10: pVO2 25.6 (74%) slope 34 ME/MVV 67% RER 1.17 Normal HR and BP response.   LHC 08/03/2010: LAD 40% stent patent LCX 30-40% prox OM-1 stent patent RCA 40% EF 15%.  06/13/11 ECHO 30% 11/22/12 ECHO 35-40%, RV good 3/17 EF 35-40%  He returns for yearly follow up. Overall feels. Remains active. No SOB, CP, orthopnea or PND.      ROS: All systems negative except as listed in HPI, PMH and Problem List.  Past Medical History:  Diagnosis Date  . CAD (coronary artery disease)    s/p Anterior MI 5/11. LAD stent  . Chronic kidney disease, stage III (moderate)   . Chronic systolic heart failure (HCC)    EF 10-15%.   . Dual implantable cardiac defibrillator    St. Jude-Analyze ST study  . GERD (gastroesophageal reflux disease)   . HLD (hyperlipidemia)     Current Outpatient Prescriptions  Medication Sig Dispense Refill  . aspirin 81 MG tablet Take 81 mg by mouth daily.      . candesartan (ATACAND) 8 MG tablet TAKE 2 TABLETS (16 MG TOTAL) BY MOUTH 2 (TWO) TIMES DAILY. NEEDS OFFICE VISIT FOR FURTHER REFILLS 120 tablet 0  . carvedilol (COREG) 12.5 MG tablet Take 1 tablet (12.5 mg total) by mouth 2 (two) times daily. Needs office visit 60 tablet 3  . cetirizine (ZYRTEC) 10 MG tablet Take 10 mg by mouth daily.      . clopidogrel (PLAVIX) 75 MG  tablet TAKE 1 TABLET BY MOUTH ONCE DAILY 90 tablet 3  . eplerenone (INSPRA) 25 MG tablet TAKE 1 TABLET BY MOUTH ONCE DAILY 90 tablet 3  . furosemide (LASIX) 20 MG tablet TAKE 1 TABLET BY MOUTH EVERY MORNING 30 tablet 00  . rosuvastatin (CRESTOR) 40 MG tablet TAKE 1 TABLET (40 MG TOTAL) BY MOUTH DAILY. NEEDS OFFICE VISIT FOR FURTHER REFILLS 30 tablet 0  . nitroGLYCERIN (NITROSTAT) 0.4 MG SL tablet Place 1 tablet (0.4 mg total) under the tongue every 5 (five) minutes as needed for chest pain. (Patient not taking: Reported on 07/12/2016) 25 tablet 9  . predniSONE (DELTASONE) 20 MG tablet Take 20 mg by mouth daily as needed for anaphylaxis.  1   No current facility-administered medications for this encounter.      Vitals:   07/12/16 1221  BP: 134/84  Pulse: (!) 57  SpO2: 99%  Weight: 219 lb 4 oz (99.5 kg)   PHYSICAL EXAM: General:  Well appearing. No resp difficulty HEENT: normal Neck: supple. no JVD. Carotids 2+ bilat; no bruits. No lymphadenopathy or thryomegaly appreciated. Cor: PMI nondisplaced. Loletha Grayer regular. No rubs, gallops or murmurs. Lungs: clear Abdomen: soft, nontender, nondistended. No hepatosplenomegaly. No bruits or masses. Good bowel sounds.  Extremities: no cyanosis, clubbing, rash, edema Neuro: alert & orientedx3, cranial nerves grossly intact. moves all 4 extremities w/o difficulty. Affect pleasant   ECG: Sinus brady 50 iLBBB (146ms)  Personally reviewed   ASSESSMENT & PLAN:  1) CAD:  - no s/s ischemia - continue ASA, statin, plavix and BB - Lipids followed by Dr. Nadara Mustard 2) Chronic systolic HF: ICM, EF 84-53% (11/2012) s/p ICD Echo 3/17 EF 35-40% - Doing well. NYHA I symptoms and volume stable - Continue carvedilol at 12.5 twice a day. HR too low to adjust.  - Candesartan 16 mg BID at goal and eplerenone 25 mg goal. Not candidate for Entresto due to angioedema with ACE-I   Glori Bickers MD  12:42 PM

## 2016-07-15 ENCOUNTER — Other Ambulatory Visit: Payer: Self-pay | Admitting: Internal Medicine

## 2016-08-16 ENCOUNTER — Other Ambulatory Visit: Payer: Self-pay | Admitting: Dermatology

## 2016-08-16 DIAGNOSIS — C4491 Basal cell carcinoma of skin, unspecified: Secondary | ICD-10-CM

## 2016-08-16 HISTORY — DX: Basal cell carcinoma of skin, unspecified: C44.91

## 2016-09-10 ENCOUNTER — Other Ambulatory Visit (HOSPITAL_COMMUNITY): Payer: Self-pay | Admitting: Internal Medicine

## 2016-09-23 ENCOUNTER — Telehealth: Payer: Self-pay | Admitting: Cardiology

## 2016-09-23 NOTE — Telephone Encounter (Signed)
LMOVM requesting that pt send manual transmission b/c home monitor has not updated in at least 7 days.    

## 2016-09-29 ENCOUNTER — Telehealth (HOSPITAL_COMMUNITY): Payer: Self-pay

## 2016-09-29 ENCOUNTER — Other Ambulatory Visit (HOSPITAL_COMMUNITY): Payer: Self-pay | Admitting: *Deleted

## 2016-09-29 MED ORDER — SILDENAFIL CITRATE 20 MG PO TABS: 20.0000 mg | ORAL_TABLET | Freq: Every day | ORAL | 0 refills | Status: DC

## 2016-10-10 NOTE — Telephone Encounter (Signed)
Opened in error

## 2016-10-12 ENCOUNTER — Telehealth: Payer: Self-pay | Admitting: Cardiology

## 2016-10-12 NOTE — Telephone Encounter (Signed)
LMOVM requesting that pt send manual transmission b/c home monitor has not updated in at least 7 days.    

## 2016-10-20 ENCOUNTER — Telehealth: Payer: Self-pay | Admitting: Cardiology

## 2016-10-20 NOTE — Telephone Encounter (Signed)
LMOVM requesting that pt send manual transmission b/c home monitor has not updated in at least 7 days.    

## 2016-11-11 ENCOUNTER — Telehealth: Payer: Self-pay | Admitting: Cardiology

## 2016-11-11 NOTE — Telephone Encounter (Signed)
LMOVM requesting that pt send manual transmission b/c home monitor has not updated in at least 7 days.    

## 2016-11-17 ENCOUNTER — Other Ambulatory Visit: Payer: Self-pay | Admitting: Internal Medicine

## 2016-11-22 ENCOUNTER — Telehealth: Payer: Self-pay | Admitting: Cardiology

## 2016-11-22 NOTE — Telephone Encounter (Signed)
LMOVM requesting that pt send manual transmission b/c home monitor has not updated in at least 7 days.    

## 2016-12-05 ENCOUNTER — Other Ambulatory Visit: Payer: Self-pay | Admitting: Internal Medicine

## 2017-01-05 ENCOUNTER — Other Ambulatory Visit: Payer: Self-pay | Admitting: Dermatology

## 2017-01-12 ENCOUNTER — Other Ambulatory Visit (HOSPITAL_COMMUNITY): Payer: Self-pay | Admitting: Cardiology

## 2017-02-10 ENCOUNTER — Other Ambulatory Visit (HOSPITAL_COMMUNITY): Payer: Self-pay | Admitting: Cardiology

## 2017-02-22 ENCOUNTER — Telehealth: Payer: Self-pay | Admitting: Cardiology

## 2017-02-22 NOTE — Telephone Encounter (Signed)
Spoke w/ pt and requested that he send a manual transmission b/c his home monitor has not updated in at least 7 days.   

## 2017-03-20 ENCOUNTER — Other Ambulatory Visit (HOSPITAL_COMMUNITY): Payer: Self-pay | Admitting: Internal Medicine

## 2017-03-31 ENCOUNTER — Other Ambulatory Visit: Payer: Self-pay | Admitting: Internal Medicine

## 2017-04-13 ENCOUNTER — Other Ambulatory Visit: Payer: Self-pay | Admitting: Dermatology

## 2017-05-09 ENCOUNTER — Other Ambulatory Visit (HOSPITAL_COMMUNITY): Payer: Self-pay | Admitting: Cardiology

## 2017-05-19 ENCOUNTER — Ambulatory Visit: Payer: BC Managed Care – PPO | Admitting: Urology

## 2017-05-19 ENCOUNTER — Ambulatory Visit (HOSPITAL_COMMUNITY)
Admission: RE | Admit: 2017-05-19 | Discharge: 2017-05-19 | Disposition: A | Discharge status: Home / Self Care | Payer: BC Managed Care – PPO | Source: Ambulatory Visit | Attending: Urology | Admitting: Urology

## 2017-05-19 ENCOUNTER — Other Ambulatory Visit: Payer: Self-pay | Admitting: Urology

## 2017-05-19 DIAGNOSIS — N132 Hydronephrosis with renal and ureteral calculous obstruction: Secondary | ICD-10-CM | POA: Diagnosis not present

## 2017-05-19 DIAGNOSIS — R350 Frequency of micturition: Secondary | ICD-10-CM

## 2017-05-19 DIAGNOSIS — N400 Enlarged prostate without lower urinary tract symptoms: Secondary | ICD-10-CM | POA: Insufficient documentation

## 2017-05-19 DIAGNOSIS — N201 Calculus of ureter: Secondary | ICD-10-CM

## 2017-05-19 DIAGNOSIS — K8020 Calculus of gallbladder without cholecystitis without obstruction: Secondary | ICD-10-CM | POA: Insufficient documentation

## 2017-05-19 DIAGNOSIS — I700 Atherosclerosis of aorta: Secondary | ICD-10-CM | POA: Insufficient documentation

## 2017-05-22 ENCOUNTER — Other Ambulatory Visit: Payer: Self-pay | Admitting: Urology

## 2017-06-01 NOTE — Patient Instructions (Signed)
Derrick Lane  06/01/2017     @PREFPERIOPPHARMACY @   Your procedure is scheduled on Wednesday, June 5.  Report to [facility] at 1200 P.M.  Call this number if you have problems the morning of surgery:  [phone]   Remember:  No food after midnight.      Take these medicines the morning of surgery with A SIP OF WATER Zyrtec, candesartan, coreg, and zofran if needed    Do not wear jewelry, make-up or nail polish.  Do not wear lotions, powders, or perfumes, or deodorant.  Do not shave 48 hours prior to surgery.  Men may shave face and neck.  Do not bring valuables to the hospital.  [facility] is not responsible for any belongings or valuables.  Contacts, dentures or bridgework may not be worn into surgery.  Leave your suitcase in the car.  After surgery it may be brought to your room.  For patients admitted to the hospital, discharge time will be determined by your treatment team.  Patients discharged the day of surgery will not be allowed to drive home.   Name and phone number of your driver:   family  Please read over the following fact sheets that you were given. Pain Booklet, Surgical Site Infection Prevention, Anesthesia Post-op Instructions and Care and Recovery After Surgery      Cystoscopy Cystoscopy is a procedure that is used to help diagnose and sometimes treat conditions that affect that lower urinary tract. The lower urinary tract includes the bladder and the tube that drains urine from the bladder out of the body (urethra). Cystoscopy is performed with a thin, tube-shaped instrument with a light and camera at the end (cystoscope). The cystoscope may be hard (rigid) or flexible, depending on the goal of the procedure.The cystoscope is inserted through the urethra, into the bladder. Cystoscopy may be recommended if you have:  Urinary tractinfections that keep coming back (recurring).  Blood in the urine (hematuria).  Loss of bladder control (urinary  incontinence) or an overactive bladder.  Unusual cells found in a urine sample.  A blockage in the urethra.  Painful urination.  An abnormality in the bladder found during an intravenous pyelogram (IVP) or CT scan.  Cystoscopy may also be done to remove a sample of tissue to be examined under a microscope (biopsy). Tell a health care provider about:  Any allergies you have.  All medicines you are taking, including vitamins, herbs, eye drops, creams, and over-the-counter medicines.  Any problems you or family members have had with anesthetic medicines.  Any blood disorders you have.  Any surgeries you have had.  Any medical conditions you have.  Whether you are pregnant or may be pregnant. What are the risks? Generally, this is a safe procedure. However, problems may occur, including:  Infection.  Bleeding.  Allergic reactions to medicines.  Damage to other structures or organs.  What happens before the procedure?  Ask your health care provider about: ? Changing or stopping your regular medicines. This is especially important if you are taking diabetes medicines or blood thinners. ? Taking medicines such as aspirin and ibuprofen. These medicines can thin your blood. Do not take these medicines before your procedure if your health care provider instructs you not to.  Follow instructions from your health care provider about eating or drinking restrictions.  You may be given antibiotic medicine to help prevent infection.  You may have an exam or testing, such as X-rays of the bladder, urethra,  or kidneys.  You may have urine tests to check for signs of infection.  Plan to have someone take you home after the procedure. What happens during the procedure?  To reduce your risk of infection,your health care team will wash or sanitize their hands.  You will be given one or more of the following: ? A medicine to help you relax (sedative). ? A medicine to numb the area  (local anesthetic).  The area around the opening of your urethra will be cleaned.  The cystoscope will be passed through your urethra into your bladder.  Germ-free (sterile)fluid will flow through the cystoscope to fill your bladder. The fluid will stretch your bladder so that your surgeon can clearly examine your bladder walls.  The cystoscope will be removed and your bladder will be emptied. The procedure may vary among health care providers and hospitals. What happens after the procedure?  You may have some soreness or pain in your abdomen and urethra. Medicines will be available to help you.  You may have some blood in your urine.  Do not drive for 24 hours if you received a sedative. This information is not intended to replace advice given to you by your health care provider. Make sure you discuss any questions you have with your health care provider. Document Released: 12/18/1999 Document Revised: 04/30/2015 Document Reviewed: 11/06/2014 Elsevier Interactive Patient Education  2018 [employer]. Cystoscopy, Care After Refer to this sheet in the next few weeks. These instructions provide you with information about caring for yourself after your procedure. Your health care provider may also give you more specific instructions. Your treatment has been planned according to current medical practices, but problems sometimes occur. Call your health care provider if you have any problems or questions after your procedure. What can I expect after the procedure? After the procedure, it is common to have:  Mild pain when you urinate. Pain should stop within a few minutes after you urinate. This may last for up to 1 week.  A small amount of blood in your urine for several days.  Feeling like you need to urinate but producing only a small amount of urine.  Follow these instructions at home:  Medicines  Take over-the-counter and prescription medicines only as told by your health care  provider.  If you were prescribed an antibiotic medicine, take it as told by your health care provider. Do not stop taking the antibiotic even if you start to feel better. General instructions   Return to your normal activities as told by your health care provider. Ask your health care provider what activities are safe for you.  Do not drive for 24 hours if you received a sedative.  Watch for any blood in your urine. If the amount of blood in your urine increases, call your health care provider.  Follow instructions from your health care provider about eating or drinking restrictions.  If a tissue sample was removed for testing (biopsy) during your procedure, it is your responsibility to get your test results. Ask your health care provider or the department performing the test when your results will be ready.  Drink enough fluid to keep your urine clear or pale yellow.  Keep all follow-up visits as told by your health care provider. This is important. Contact a health care provider if:  You have pain that gets worse or does not get better with medicine, especially pain when you urinate.  You have difficulty urinating. Get help right away if:  You have more blood in your urine.  You have blood clots in your urine.  You have abdominal pain.  You have a fever or chills.  You are unable to urinate. This information is not intended to replace advice given to you by your health care provider. Make sure you discuss any questions you have with your health care provider. Document Released: 07/09/2004 Document Revised: 05/28/2015 Document Reviewed: 11/06/2014 Elsevier Interactive Patient Education  2018 [place] Anesthesia, Adult, Care After These instructions provide you with information about caring for yourself after your procedure. Your health care provider may also give you more specific instructions. Your treatment has been planned according to current medical  practices, but problems sometimes occur. Call your health care provider if you have any problems or questions after your procedure. What can I expect after the procedure? After the procedure, it is common to have:  Vomiting.  A sore throat.  Mental slowness.  It is common to feel:  Nauseous.  Cold or shivery.  Sleepy.  Tired.  Sore or achy, even in parts of your body where you did not have surgery.  Follow these instructions at home: For at least 24 hours after the procedure:  Do not: ? Participate in activities where you could fall or become injured. ? Drive. ? Use heavy machinery. ? Drink alcohol. ? Take sleeping pills or medicines that cause drowsiness. ? Make important decisions or sign legal documents. ? Take care of children on your own.  Rest. Eating and drinking  If you vomit, drink water, juice, or soup when you can drink without vomiting.  Drink enough fluid to keep your urine clear or pale yellow.  Make sure you have little or no nausea before eating solid foods.  Follow the diet recommended by your health care provider. General instructions  Have a responsible adult stay with you until you are awake and alert.  Return to your normal activities as told by your health care provider. Ask your health care provider what activities are safe for you.  Take over-the-counter and prescription medicines only as told by your health care provider.  If you smoke, do not smoke without supervision.  Keep all follow-up visits as told by your health care provider. This is important. Contact a health care provider if:  You continue to have nausea or vomiting at home, and medicines are not helpful.  You cannot drink fluids or start eating again.  You cannot urinate after 8-12 hours.  You develop a skin rash.  You have fever.  You have increasing redness at the site of your procedure. Get help right away if:  You have difficulty breathing.  You have  chest pain.  You have unexpected bleeding.  You feel that you are having a life-threatening or urgent problem. This information is not intended to replace advice given to you by your health care provider. Make sure you discuss any questions you have with your health care provider. Document Released: 03/28/2000 Document Revised: 05/25/2015 Document Reviewed: 12/04/2014 Elsevier Interactive Patient Education  [employer].

## 2017-06-05 ENCOUNTER — Other Ambulatory Visit: Payer: Self-pay

## 2017-06-05 ENCOUNTER — Encounter (HOSPITAL_COMMUNITY): Payer: Self-pay

## 2017-06-05 ENCOUNTER — Encounter (HOSPITAL_COMMUNITY)
Admission: RE | Admit: 2017-06-05 | Discharge: 2017-06-05 | Disposition: A | Discharge status: Home / Self Care | Payer: BC Managed Care – PPO | Source: Ambulatory Visit | Attending: Urology | Admitting: Urology

## 2017-06-05 DIAGNOSIS — Z01818 Encounter for other preprocedural examination: Secondary | ICD-10-CM | POA: Insufficient documentation

## 2017-06-05 HISTORY — DX: Presence of cardiac pacemaker: Z95.0

## 2017-06-05 HISTORY — DX: Acute myocardial infarction, unspecified: I21.9

## 2017-06-05 HISTORY — DX: Presence of automatic (implantable) cardiac defibrillator: Z95.810

## 2017-06-05 HISTORY — DX: Malignant (primary) neoplasm, unspecified: C80.1

## 2017-06-05 HISTORY — DX: Personal history of urinary calculi: Z87.442

## 2017-06-05 LAB — CBC WITH DIFFERENTIAL/PLATELET
BASOS ABS: 0.1 10*3/uL (ref 0.0–0.1)
Basophils Relative: 1 %
EOS ABS: 0.5 10*3/uL (ref 0.0–0.7)
EOS PCT: 6 %
HCT: 45.5 % (ref 39.0–52.0)
Hemoglobin: 15.2 g/dL (ref 13.0–17.0)
Lymphocytes Relative: 34 %
Lymphs Abs: 2.6 10*3/uL (ref 0.7–4.0)
MCH: 30 pg (ref 26.0–34.0)
MCHC: 33.4 g/dL (ref 30.0–36.0)
MCV: 89.9 fL (ref 78.0–100.0)
MONO ABS: 1 10*3/uL (ref 0.1–1.0)
Monocytes Relative: 13 %
Neutro Abs: 3.5 10*3/uL (ref 1.7–7.7)
Neutrophils Relative %: 46 %
PLATELETS: 158 10*3/uL (ref 150–400)
RBC: 5.06 MIL/uL (ref 4.22–5.81)
RDW: 13.1 % (ref 11.5–15.5)
WBC: 7.6 10*3/uL (ref 4.0–10.5)

## 2017-06-05 LAB — BASIC METABOLIC PANEL
ANION GAP: 9 (ref 5–15)
BUN: 14 mg/dL (ref 6–20)
CALCIUM: 9.2 mg/dL (ref 8.9–10.3)
CO2: 27 mmol/L (ref 22–32)
Chloride: 103 mmol/L (ref 101–111)
Creatinine, Ser: 1.04 mg/dL (ref 0.61–1.24)
GFR calc Af Amer: >60 mL/min (ref >60)
GFR calc non Af Amer: >60 mL/min (ref >60)
Glucose, Bld: 97 mg/dL (ref 65–99)
POTASSIUM: 4 mmol/L (ref 3.5–5.1)
SODIUM: 139 mmol/L (ref 135–145)

## 2017-06-05 NOTE — Pre-Procedure Instructions (Signed)
Patient in for PAT and states, "I need didn't remember that I was supposed to stop my plavix on last Wednesday, May 29. I took my last dose on Saturday June 1. I hope that I didn't mess that up." I called Dr Noland Fordyce office and left a message on the machine, explaining above and asked them to call me and let me know if it was okay to proceed since Plavix wasn't stopped when Dr Alyson Ingles had wanted it to be. Left my number and Blair Hailey, schedulers number for them to call back to. Patient also has ICD and I faxed this form to Dr Haroldine Laws and sent cover sheet explaining the need for this to be filled out and re-faxed for his chart prior to surgery on 07 June 2017.

## 2017-06-06 NOTE — Pre-Procedure Instructions (Signed)
Selita from Dr Noland Fordyce office calls back and states that Dr Alyson Ingles is Okay to go ahead with patient's surgery tomorrow.

## 2017-06-07 ENCOUNTER — Other Ambulatory Visit (HOSPITAL_COMMUNITY): Payer: Self-pay | Admitting: Internal Medicine

## 2017-06-07 ENCOUNTER — Ambulatory Visit (HOSPITAL_COMMUNITY)
Admission: RE | Admit: 2017-06-07 | Discharge: 2017-06-07 | Disposition: A | Discharge status: Home / Self Care | Payer: BC Managed Care – PPO | Source: Ambulatory Visit | Attending: Urology | Admitting: Urology

## 2017-06-07 ENCOUNTER — Encounter (HOSPITAL_COMMUNITY): Payer: Self-pay | Admitting: *Deleted

## 2017-06-07 ENCOUNTER — Ambulatory Visit (HOSPITAL_COMMUNITY): Payer: BC Managed Care – PPO | Admitting: Anesthesiology

## 2017-06-07 ENCOUNTER — Encounter (HOSPITAL_COMMUNITY)
Admission: RE | Disposition: A | Discharge status: Home / Self Care | Payer: Self-pay | Source: Ambulatory Visit | Attending: Urology

## 2017-06-07 ENCOUNTER — Ambulatory Visit (HOSPITAL_COMMUNITY): Payer: BC Managed Care – PPO

## 2017-06-07 DIAGNOSIS — E785 Hyperlipidemia, unspecified: Secondary | ICD-10-CM | POA: Diagnosis not present

## 2017-06-07 DIAGNOSIS — K219 Gastro-esophageal reflux disease without esophagitis: Secondary | ICD-10-CM | POA: Diagnosis not present

## 2017-06-07 DIAGNOSIS — N183 Chronic kidney disease, stage 3 (moderate): Secondary | ICD-10-CM | POA: Insufficient documentation

## 2017-06-07 DIAGNOSIS — I5022 Chronic systolic (congestive) heart failure: Secondary | ICD-10-CM | POA: Diagnosis not present

## 2017-06-07 DIAGNOSIS — N201 Calculus of ureter: Secondary | ICD-10-CM

## 2017-06-07 DIAGNOSIS — N132 Hydronephrosis with renal and ureteral calculous obstruction: Secondary | ICD-10-CM | POA: Diagnosis not present

## 2017-06-07 DIAGNOSIS — Z95810 Presence of automatic (implantable) cardiac defibrillator: Secondary | ICD-10-CM | POA: Insufficient documentation

## 2017-06-07 DIAGNOSIS — Z85828 Personal history of other malignant neoplasm of skin: Secondary | ICD-10-CM | POA: Insufficient documentation

## 2017-06-07 DIAGNOSIS — I252 Old myocardial infarction: Secondary | ICD-10-CM | POA: Diagnosis not present

## 2017-06-07 DIAGNOSIS — Z888 Allergy status to other drugs, medicaments and biological substances status: Secondary | ICD-10-CM | POA: Diagnosis not present

## 2017-06-07 DIAGNOSIS — I2510 Atherosclerotic heart disease of native coronary artery without angina pectoris: Secondary | ICD-10-CM | POA: Diagnosis not present

## 2017-06-07 DIAGNOSIS — Z87442 Personal history of urinary calculi: Secondary | ICD-10-CM | POA: Insufficient documentation

## 2017-06-07 DIAGNOSIS — Z8249 Family history of ischemic heart disease and other diseases of the circulatory system: Secondary | ICD-10-CM | POA: Diagnosis not present

## 2017-06-07 DIAGNOSIS — Z91018 Allergy to other foods: Secondary | ICD-10-CM | POA: Insufficient documentation

## 2017-06-07 DIAGNOSIS — I130 Hypertensive heart and chronic kidney disease with heart failure and stage 1 through stage 4 chronic kidney disease, or unspecified chronic kidney disease: Secondary | ICD-10-CM | POA: Diagnosis not present

## 2017-06-07 HISTORY — PX: CYSTOSCOPY WITH RETROGRADE PYELOGRAM, URETEROSCOPY AND STENT PLACEMENT: SHX5789

## 2017-06-07 SURGERY — CYSTOURETEROSCOPY, WITH RETROGRADE PYELOGRAM AND STENT INSERTION
Anesthesia: General | Site: Ureter | Laterality: Right

## 2017-06-07 MED ORDER — SODIUM CHLORIDE 0.9 % IR SOLN
Status: DC | PRN
  Administered 2017-06-07: 3000 mL

## 2017-06-07 MED ORDER — EPHEDRINE SULFATE 50 MG/ML IJ SOLN
INTRAMUSCULAR | Status: DC | PRN
  Administered 2017-06-07: 5 mg via INTRAVENOUS

## 2017-06-07 MED ORDER — DIATRIZOATE MEGLUMINE 30 % UR SOLN
URETHRAL | Status: DC | PRN
  Administered 2017-06-07: 20 mL via URETHRAL

## 2017-06-07 MED ORDER — PROPOFOL 10 MG/ML IV BOLUS
INTRAVENOUS | Status: DC | PRN
  Administered 2017-06-07: 180 mg via INTRAVENOUS

## 2017-06-07 MED ORDER — OXYCODONE-ACETAMINOPHEN 5-325 MG PO TABS: 1.0000 | ORAL_TABLET | ORAL | 0 refills | Status: DC | PRN

## 2017-06-07 MED ORDER — EPHEDRINE SULFATE 50 MG/ML IJ SOLN
INTRAMUSCULAR | Status: AC
  Filled 2017-06-07: qty 1

## 2017-06-07 MED ORDER — SUGAMMADEX SODIUM 200 MG/2ML IV SOLN
INTRAVENOUS | Status: AC
  Filled 2017-06-07: qty 2

## 2017-06-07 MED ORDER — CEFAZOLIN SODIUM-DEXTROSE 2-4 GM/100ML-% IV SOLN
2.0000 g | INTRAVENOUS | Status: AC
  Administered 2017-06-07: 2 g via INTRAVENOUS
  Filled 2017-06-07: qty 100

## 2017-06-07 MED ORDER — FENTANYL CITRATE (PF) 100 MCG/2ML IJ SOLN
INTRAMUSCULAR | Status: AC
  Filled 2017-06-07: qty 2

## 2017-06-07 MED ORDER — DIATRIZOATE MEGLUMINE 30 % UR SOLN
URETHRAL | Status: AC
  Filled 2017-06-07: qty 100

## 2017-06-07 MED ORDER — FENTANYL CITRATE (PF) 100 MCG/2ML IJ SOLN
INTRAMUSCULAR | Status: DC | PRN
  Administered 2017-06-07: 25 ug via INTRAVENOUS

## 2017-06-07 MED ORDER — LIDOCAINE HCL (PF) 1 % IJ SOLN
INTRAMUSCULAR | Status: AC
  Filled 2017-06-07: qty 5

## 2017-06-07 MED ORDER — LACTATED RINGERS IV SOLN
INTRAVENOUS | Status: DC
  Administered 2017-06-07: 12:00:00 via INTRAVENOUS

## 2017-06-07 MED ORDER — NEOSTIGMINE METHYLSULFATE 10 MG/10ML IV SOLN
INTRAVENOUS | Status: AC
  Filled 2017-06-07: qty 1

## 2017-06-07 MED ORDER — GLYCOPYRROLATE 0.2 MG/ML IJ SOLN
INTRAMUSCULAR | Status: AC
  Filled 2017-06-07: qty 4

## 2017-06-07 MED ORDER — SODIUM CHLORIDE 0.9 % IJ SOLN
INTRAMUSCULAR | Status: AC
  Filled 2017-06-07: qty 10

## 2017-06-07 MED ORDER — ONDANSETRON HCL 4 MG/2ML IJ SOLN
INTRAMUSCULAR | Status: AC
Start: 2017-06-07 — End: ?
  Filled 2017-06-07: qty 2

## 2017-06-07 MED ORDER — GLYCOPYRROLATE 0.2 MG/ML IJ SOLN
INTRAMUSCULAR | Status: AC
  Filled 2017-06-07: qty 1

## 2017-06-07 MED ORDER — PROPOFOL 10 MG/ML IV BOLUS
INTRAVENOUS | Status: AC
  Filled 2017-06-07: qty 40

## 2017-06-07 SURGICAL SUPPLY — 23 items
BAG DRAIN URO TABLE W/ADPT NS (DRAPE) ×3 IMPLANT
CATH INTERMIT  6FR 70CM (CATHETERS) ×3 IMPLANT
CLOTH BEACON ORANGE TIMEOUT ST (SAFETY) ×3 IMPLANT
DECANTER SPIKE VIAL GLASS SM (MISCELLANEOUS) ×3 IMPLANT
EXTRACTOR STONE NITINOL NGAGE (UROLOGICAL SUPPLIES) ×3 IMPLANT
FIBER LASER FLEXIVA 200 (UROLOGICAL SUPPLIES) ×3 IMPLANT
GLOVE BIO SURGEON STRL SZ8 (GLOVE) ×3 IMPLANT
GLOVE BIOGEL PI IND STRL 7.0 (GLOVE) ×1 IMPLANT
GLOVE BIOGEL PI INDICATOR 7.0 (GLOVE) ×2
GOWN STRL REUS W/ TWL XL LVL3 (GOWN DISPOSABLE) ×1 IMPLANT
GOWN STRL REUS W/TWL LRG LVL3 (GOWN DISPOSABLE) ×3 IMPLANT
GOWN STRL REUS W/TWL XL LVL3 (GOWN DISPOSABLE) ×2
GUIDEWIRE STR DUAL SENSOR (WIRE) ×3 IMPLANT
GUIDEWIRE STR ZIPWIRE 035X150 (MISCELLANEOUS) ×3 IMPLANT
IV NS IRRIG 3000ML ARTHROMATIC (IV SOLUTION) ×6 IMPLANT
KIT TURNOVER CYSTO (KITS) ×3 IMPLANT
MANIFOLD NEPTUNE II (INSTRUMENTS) ×3 IMPLANT
PACK CYSTO (CUSTOM PROCEDURE TRAY) ×3 IMPLANT
PAD ARMBOARD 7.5X6 YLW CONV (MISCELLANEOUS) ×3 IMPLANT
STENT URET 6FRX26 CONTOUR (STENTS) ×3 IMPLANT
SYR 10ML LL (SYRINGE) ×3 IMPLANT
TOWEL OR 17X26 4PK STRL BLUE (TOWEL DISPOSABLE) ×3 IMPLANT
WATER STERILE IRR 500ML POUR (IV SOLUTION) ×3 IMPLANT

## 2017-06-07 NOTE — Anesthesia Procedure Notes (Signed)
Procedure Name: LMA Insertion Date/Time: 06/07/2017 2:08 PM Performed by: Vista Deck, CRNA Pre-anesthesia Checklist: Patient identified, Patient being monitored, Emergency Drugs available, Timeout performed and Suction available Patient Re-evaluated:Patient Re-evaluated prior to induction Oxygen Delivery Method: Circle System Utilized Preoxygenation: Pre-oxygenation with 100% oxygen Induction Type: IV induction Ventilation: Mask ventilation without difficulty LMA: LMA inserted LMA Size: 4.0 Number of attempts: 1 Placement Confirmation: positive ETCO2 and breath sounds checked- equal and bilateral Tube secured with: Tape Dental Injury: Teeth and Oropharynx as per pre-operative assessment

## 2017-06-07 NOTE — Discharge Instructions (Signed)

## 2017-06-07 NOTE — Op Note (Signed)
Preoperative diagnosis: Right ureteral stone  Postoperative diagnosis: Same  Procedure: 1 cystoscopy 2 right retrograde pyelography 3.  Intraoperative fluoroscopy, under one hour, with interpretation 4.  Right ureteroscopic stone manipulation with laser lithotripsy 5.  Right 6 x 26 JJ stent placement  Attending: Nicolette Bang  Anesthesia: General  Estimated blood loss: None  Drains: Right 6 x 26 JJ ureteral stent without tether  Specimens: stone for analysis  Antibiotics: ancef  Findings: 1cm right distal ureteral calculus. Mild hydroureteronephrosis.  Indications: Patient is a 57 year old male with a history of ureteral stone and who has failed medical expulsive therapy.  After discussing treatment options, she decided proceed with right ureteroscopic stone manipulation.  Procedure her in detail: The patient was brought to the operating room and a brief timeout was done to ensure correct patient, correct procedure, correct site.  General anesthesia was administered patient was placed in dorsal lithotomy position.  Her genitalia was then prepped and draped in usual sterile fashion.  A rigid 21 French cystoscope was passed in the urethra and the bladder.  Bladder was inspected free masses or lesions.  the right ureteral orifices were in the normal orthotopic locations.  a 6 french ureteral catheter was then instilled into the right ureter orifice.  a gentle retrograde was obtained and findings noted above.  we then placed a zip wire through the ureteral catheter and advanced up to the renal pelvis.  we then removed the cystoscope and cannulated the right ureteral orifice with a semirigid ureteroscope.  we then encountered the stone in the distal ureter.   using a 200 nm laser fiber and fragmented the stone into smaller pieces.  the pieces were then removed with a Ngage basket.     once all stone fragments were removed we then placed a 6 x 26 double-j ureteral stent over the original zip  wire. We then removed the wire and good coil was noted in the the renal pelvis under fluoroscopy and the bladder under direct vision. the stone fragments were then removed from the bladder and sent for analysis.   the bladder was then drained and this concluded the procedure which was well tolerated by patient.  Complications: None  Condition: Stable, extubated, transferred to PACU  Plan: Patient is to be discharged home as to follow-up in one week for stent removal.

## 2017-06-07 NOTE — Transfer of Care (Signed)
Immediate Anesthesia Transfer of Care Note  Patient: Derrick Lane  Procedure(s) Performed: CYSTOSCOPY WITH RETROGRADE PYELOGRAM, URETEROSCOPY AND STENT PLACEMENT HOLMIUM LASER (Right Ureter)  Patient Location: PACU  Anesthesia Type:General  Level of Consciousness: awake and patient cooperative  Airway & Oxygen Therapy: Patient Spontanous Breathing  Post-op Assessment: Report given to RN and Post -op Vital signs reviewed and stable  Post vital signs: Reviewed and stable  Last Vitals:  Vitals Value Taken Time  BP 112/78 06/07/2017  3:18 PM  Temp    Pulse 57 06/07/2017  3:20 PM  Resp 15 06/07/2017  3:20 PM  SpO2 92 % 06/07/2017  3:20 PM  Vitals shown include unvalidated device data.  Last Pain:  Vitals:   06/07/17 1210  TempSrc: Oral  PainSc: 0-No pain         Complications: No apparent anesthesia complications

## 2017-06-07 NOTE — H&P (Signed)
Urology Admission H&P  Chief Complaint: urinary urgency and dysuria  History of Present Illness: Mr Schnapp is a 57yo with a hx of nephrolithiasis who developed severe urgency, frequency, and dysuria 1 month ago. He had a hx of a right 41mm calculus 6 months ago that he though he passed. CT from 5/17 showed a 15mm right distal ureteral calculus. No flank pain. He continues to have severe LUTS  Past Medical History:  Diagnosis Date  . AICD (automatic cardioverter/defibrillator) present    [employer] and defibrillator  . CAD (coronary artery disease)    s/p Anterior MI 5/11. LAD stent  . Cancer ([place])    skin cancer  . Chronic kidney disease, stage III (moderate) (HCC)   . Chronic systolic heart failure (HCC)    EF 10-15%.   . Dual implantable cardiac defibrillator    St. Jude-Analyze ST study  . GERD (gastroesophageal reflux disease)   . History of kidney stones   . HLD (hyperlipidemia)   . 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  . Presence of permanent cardiac pacemaker    Past Surgical History:  Procedure Laterality Date  . CARDIAC CATHETERIZATION     stents inserted X3  . HERNIA REPAIR Bilateral    Inguinal  . INSERT / REPLACE / REMOVE PACEMAKER     ICD    Home Medications:  Current Facility-Administered Medications  Medication Dose Route Frequency Provider Last Rate Last Dose  . ceFAZolin (ANCEF) IVPB 2g/100 mL premix  2 g Intravenous 30 min Pre-Op McKenzie, Candee Furbish, MD       Allergies:  Allergies  Allergen Reactions  . Beef-Derived Products Hives and Swelling    Due to alpha gal syndrome (all mammals)  . Peach Flavor Other (See Comments)    Determined via allergy testing.  . Pork-Derived Products Hives and Swelling    Due to alpha gal syndrome (all mammals)  . Ramipril Swelling    Possible lip swelling but may have been attributed to other allergy  . Spironolactone Other (See Comments)    swelling of the breasts (gynecomastia) and breast  pain    Family History  Problem Relation Age of Onset  . Heart attack Mother   . Hyperlipidemia Mother   . Hypertension Mother   . Heart failure Father   . Heart attack Father   . Hypertension Father   . Coronary artery disease Unknown    Social History:  reports that he has never smoked. He has never used smokeless tobacco. He reports that he does not drink alcohol or use drugs.  Review of Systems  Genitourinary: Positive for dysuria, frequency and urgency.  All other systems reviewed and are negative.   Physical Exam:  Vital signs in last 24 hours: Temp:  [97.9 F (36.6 C)] 97.9 F (36.6 C) (06/05 1210) Pulse Rate:  [51] 51 (06/05 1210) Resp:  [18] 18 (06/05 1210) BP: (107)/(72) 107/72 (06/05 1210) SpO2:  [99 %] 99 % (06/05 1210) Physical Exam  Constitutional: He is oriented to person, place, and time. He appears well-developed and well-nourished.  HENT:  Head: Normocephalic and atraumatic.  Eyes: Pupils are equal, round, and reactive to light. EOM are normal.  Neck: Normal range of motion. No thyromegaly present.  Cardiovascular: Normal rate and regular rhythm.  Respiratory: Effort normal. No respiratory distress.  GI: Soft. He exhibits no distension.  Musculoskeletal: Normal range of motion. He exhibits no edema.  Neurological: He is alert and oriented to person, place, and  time.  Skin: Skin is warm and dry.  Psychiatric: He has a normal mood and affect. His behavior is normal. Judgment and thought content normal.    Laboratory Data:  No results found for this or any previous visit (from the past 24 hour(s)). No results found for this or any previous visit (from the past 240 hour(s)). Creatinine: Recent Labs    06/05/17 1322  CREATININE 1.04   Baseline Creatinine: 1  Impression/Assessment:  57yo with right distal ureteral calculus  Plan:  The risks/benefits/alternatives to right ureteroscopic stone extraction was explained to the patient and he  understands and wishes to proceed with surgery  Nicolette Bang 06/07/2017, 1:21 PM

## 2017-06-07 NOTE — Anesthesia Preprocedure Evaluation (Signed)
Anesthesia Evaluation  Patient identified by MRN, date of birth, ID band Patient awake    Reviewed: Allergy & Precautions, H&P , NPO status , Patient's Chart, lab work & pertinent test results, reviewed documented beta blocker date and time   Airway Mallampati: II  TM Distance: <3 FB Neck ROM: full    Dental no notable dental hx.    Pulmonary neg pulmonary ROS,    Pulmonary exam normal breath sounds clear to auscultation       Cardiovascular Exercise Tolerance: Good + CAD and + Past MI  negative cardio ROS  + pacemaker + Cardiac Defibrillator  Rhythm:regular Rate:Normal     Neuro/Psych negative neurological ROS  negative psych ROS   GI/Hepatic negative GI ROS, Neg liver ROS, GERD  ,  Endo/Other  negative endocrine ROS  Renal/GU Renal diseasenegative Renal ROS  negative genitourinary   Musculoskeletal   Abdominal   Peds  Hematology negative hematology ROS (+)   Anesthesia Other Findings Nasim is a 57 y/o with h/o CAD s/p anterior MI in May 2011 treated with DES, LHC 08/03/2010: LAD 40% stent patent LCX 30-40% prox OM-1 stent patent RCA 40% EF 15%. S/P  ICD placement - St. Jude dual chamber ICD on Aug 05, 2010, and  Chronic systolic heart failure EF 30% 06/2011,   Reproductive/Obstetrics negative OB ROS                             Anesthesia Physical Anesthesia Plan  ASA: IV  Anesthesia Plan: General   Post-op Pain Management:    Induction:   PONV Risk Score and Plan:   Airway Management Planned:   Additional Equipment:   Intra-op Plan:   Post-operative Plan:   Informed Consent: I have reviewed the patients History and Physical, chart, labs and discussed the procedure including the risks, benefits and alternatives for the proposed anesthesia with the patient or authorized representative who has indicated his/her understanding and acceptance.   Dental Advisory Given  Plan  Discussed with: CRNA  Anesthesia Plan Comments:         Anesthesia Quick Evaluation

## 2017-06-07 NOTE — Anesthesia Postprocedure Evaluation (Signed)
Anesthesia Post Note  Patient: Derrick Lane  Procedure(s) Performed: CYSTOSCOPY WITH RETROGRADE PYELOGRAM, URETEROSCOPY AND STENT PLACEMENT HOLMIUM LASER (Right Ureter)  Patient location during evaluation: PACU Anesthesia Type: General Level of consciousness: awake and alert Pain management: satisfactory to patient Vital Signs Assessment: post-procedure vital signs reviewed and stable Respiratory status: spontaneous breathing Cardiovascular status: stable Postop Assessment: no apparent nausea or vomiting Anesthetic complications: no     Last Vitals:  Vitals:   06/07/17 1520 06/07/17 1530  BP: 112/78 117/76  Pulse: (!) 58 (!) 53  Resp: 12 13  Temp: 36.6 C   SpO2: 93% 95%    Last Pain:  Vitals:   06/07/17 1530  TempSrc:   PainSc: 0-No pain                 Alajah Witman

## 2017-06-08 ENCOUNTER — Encounter (HOSPITAL_COMMUNITY): Payer: Self-pay | Admitting: Urology

## 2017-06-14 ENCOUNTER — Ambulatory Visit (INDEPENDENT_AMBULATORY_CARE_PROVIDER_SITE_OTHER): Payer: BC Managed Care – PPO | Admitting: Urology

## 2017-06-14 DIAGNOSIS — N201 Calculus of ureter: Secondary | ICD-10-CM

## 2017-06-21 ENCOUNTER — Other Ambulatory Visit: Payer: Self-pay | Admitting: Urology

## 2017-06-21 DIAGNOSIS — N200 Calculus of kidney: Secondary | ICD-10-CM

## 2017-06-22 ENCOUNTER — Encounter: Payer: Self-pay | Admitting: Internal Medicine

## 2017-06-27 ENCOUNTER — Telehealth: Payer: Self-pay

## 2017-06-27 ENCOUNTER — Ambulatory Visit (HOSPITAL_COMMUNITY)
Admission: RE | Admit: 2017-06-27 | Discharge: 2017-06-27 | Disposition: A | Discharge status: Home / Self Care | Payer: BC Managed Care – PPO | Source: Ambulatory Visit | Attending: Urology | Admitting: Urology

## 2017-06-27 ENCOUNTER — Encounter: Payer: BC Managed Care – PPO | Admitting: *Deleted

## 2017-06-27 DIAGNOSIS — Z87442 Personal history of urinary calculi: Secondary | ICD-10-CM | POA: Diagnosis not present

## 2017-06-27 DIAGNOSIS — Z09 Encounter for follow-up examination after completed treatment for conditions other than malignant neoplasm: Secondary | ICD-10-CM | POA: Diagnosis not present

## 2017-06-27 DIAGNOSIS — N200 Calculus of kidney: Secondary | ICD-10-CM

## 2017-06-27 NOTE — Telephone Encounter (Signed)
Spoke with pt and reminded pt of remote transmission that is due today. Pt verbalized understanding.   

## 2017-06-28 ENCOUNTER — Encounter: Payer: Self-pay | Admitting: Cardiology

## 2017-07-07 ENCOUNTER — Other Ambulatory Visit (HOSPITAL_COMMUNITY): Payer: Self-pay | Admitting: Internal Medicine

## 2017-07-21 ENCOUNTER — Telehealth: Payer: Self-pay

## 2017-07-21 NOTE — Telephone Encounter (Signed)
LMOVM reminding pt to send remote transmission.   

## 2017-07-26 ENCOUNTER — Telehealth (HOSPITAL_COMMUNITY): Payer: Self-pay | Admitting: Vascular Surgery

## 2017-07-26 ENCOUNTER — Ambulatory Visit (HOSPITAL_COMMUNITY)
Admission: RE | Admit: 2017-07-26 | Discharge: 2017-07-26 | Disposition: A | Discharge status: Home / Self Care | Payer: BC Managed Care – PPO | Source: Ambulatory Visit | Attending: Internal Medicine | Admitting: Internal Medicine

## 2017-07-26 VITALS — BP 113/77 | HR 60 | Wt 223.8 lb

## 2017-07-26 DIAGNOSIS — K219 Gastro-esophageal reflux disease without esophagitis: Secondary | ICD-10-CM | POA: Insufficient documentation

## 2017-07-26 DIAGNOSIS — Z95810 Presence of automatic (implantable) cardiac defibrillator: Secondary | ICD-10-CM | POA: Insufficient documentation

## 2017-07-26 DIAGNOSIS — E785 Hyperlipidemia, unspecified: Secondary | ICD-10-CM | POA: Insufficient documentation

## 2017-07-26 DIAGNOSIS — Z950 Presence of cardiac pacemaker: Secondary | ICD-10-CM | POA: Diagnosis not present

## 2017-07-26 DIAGNOSIS — R9431 Abnormal electrocardiogram [ECG] [EKG]: Secondary | ICD-10-CM | POA: Insufficient documentation

## 2017-07-26 DIAGNOSIS — N183 Chronic kidney disease, stage 3 (moderate): Secondary | ICD-10-CM | POA: Insufficient documentation

## 2017-07-26 DIAGNOSIS — Z7902 Long term (current) use of antithrombotics/antiplatelets: Secondary | ICD-10-CM | POA: Diagnosis not present

## 2017-07-26 DIAGNOSIS — Z79899 Other long term (current) drug therapy: Secondary | ICD-10-CM | POA: Insufficient documentation

## 2017-07-26 DIAGNOSIS — I2510 Atherosclerotic heart disease of native coronary artery without angina pectoris: Secondary | ICD-10-CM | POA: Insufficient documentation

## 2017-07-26 DIAGNOSIS — I252 Old myocardial infarction: Secondary | ICD-10-CM | POA: Insufficient documentation

## 2017-07-26 DIAGNOSIS — Z87442 Personal history of urinary calculi: Secondary | ICD-10-CM | POA: Diagnosis not present

## 2017-07-26 DIAGNOSIS — R001 Bradycardia, unspecified: Secondary | ICD-10-CM | POA: Insufficient documentation

## 2017-07-26 DIAGNOSIS — Z7982 Long term (current) use of aspirin: Secondary | ICD-10-CM | POA: Diagnosis not present

## 2017-07-26 DIAGNOSIS — Z955 Presence of coronary angioplasty implant and graft: Secondary | ICD-10-CM | POA: Insufficient documentation

## 2017-07-26 DIAGNOSIS — I5022 Chronic systolic (congestive) heart failure: Secondary | ICD-10-CM

## 2017-07-26 DIAGNOSIS — Z85828 Personal history of other malignant neoplasm of skin: Secondary | ICD-10-CM | POA: Diagnosis not present

## 2017-07-26 DIAGNOSIS — I959 Hypotension, unspecified: Secondary | ICD-10-CM | POA: Diagnosis not present

## 2017-07-26 NOTE — Patient Instructions (Signed)
Your physician has requested that you have an echocardiogram. Echocardiography is a painless test that uses sound waves to create images of your heart. It provides your doctor with information about the size and shape of your heart and how well your heart's chambers and valves are working. This procedure takes approximately one hour. There are no restrictions for this procedure.  IN [facility] OFFICE, THEY WILL CALL YOU TO SCHEDULE  We will contact you in 1 year to schedule your next appointment.

## 2017-07-26 NOTE — Telephone Encounter (Signed)
Spoke to pt , to give echo appt @ [place] office 8/15 @ 2:30, PT voiced understanding and confirmed appt

## 2017-07-26 NOTE — Addendum Note (Signed)
Encounter addended by: Scarlette Calico, RN on: 07/26/2017 2:28 PM  Actions taken: Order list changed, Diagnosis association updated, Sign clinical note

## 2017-07-26 NOTE — Progress Notes (Signed)
ADVANCED HF CLINIC NOTE  Patient ID: Derrick Lane, male   DOB: 1960-09-23, 57 y.o.   MRN: [id number]  EP: Dr Caryl Comes  HPI:  Derrick Lane, Derrick 08/03/2010: LAD 40% stent patent LCX 30-40% prox OM-1 stent patent RCA 40% EF 15%. S/P  ICD placement - St. Jude dual chamber ICD on Aug 05, 2010, and  Chronic systolic heart failure EF 35-40% 3/17  He not on Ace-I due to angioedema . He is not Spironolactone due to breast tenderness.  Does not tolerate hydralazine due to hypotension. (retired state trooper)  [place] 03/30/10 RA of 4, RV 33/1 (3), PA 40/17 (25), PCWP 12.  CO (Fick) 4.83 CI 2.42   CPX 07/08/10: pVO2 25.6 (74%) slope 34 ME/MVV 67% RER 1.17 Normal HR and BP response.   Derrick 08/03/2010: LAD 40% stent patent LCX 30-40% prox OM-1 stent patent RCA 40% EF 15%.  06/13/11 ECHO 30% 11/22/12 ECHO 35-40%, RV good 3/17 EF 35-40%  He returns for yearly follow up. Doing well. Remains active. Says every once in awhile he gets SOB when he does something too vigorous. No CP, orthopnea, PND. No edema. No ICD shock. Recently treated for kidney stone.     ICD interrogated personally in clinic: No VT/AF. Volume ok. Personally reviewed   ROS: All systems negative except as listed in HPI, PMH and Problem List.  Past Medical History:  Diagnosis Date  . AICD (automatic cardioverter/defibrillator) present    [employer] and defibrillator  . CAD (coronary artery disease)    s/p Anterior MI 5/11. LAD stent  . Cancer ([place])    skin cancer  . Chronic kidney disease, stage III (moderate) (HCC)   . Chronic systolic heart failure (HCC)    EF 10-15%.   . Dual implantable cardiac defibrillator    St. Jude-Analyze ST study  . GERD (gastroesophageal reflux disease)   . History of kidney stones   . HLD (hyperlipidemia)   . Myocardial infarction ([place]) 05/03/2009   had "widow-maker".  . Presence of permanent cardiac pacemaker     Current Outpatient  Medications  Medication Sig Dispense Refill  . acetaminophen (TYLENOL) 500 MG tablet Take 1,000 mg by mouth every 6 (six) hours as needed (for pain.).    Marland Kitchen amoxicillin (AMOXIL) 500 MG capsule Take 2,000 mg by mouth See admin instructions. Take 4 capsules (2000 mg) by mouth 1 hour prior to dental procedures.  99  . aspirin 81 MG tablet Take 81 mg by mouth every evening.     . candesartan (ATACAND) 8 MG tablet TAKE 2 TABLETS (16 MG TOTAL) BY MOUTH 2 (TWO) TIMES DAILY. 120 tablet 3  . carvedilol (COREG) 12.5 MG tablet TAKE 1 TABLET (12.5 MG TOTAL) BY MOUTH 2 (TWO) TIMES DAILY. NEEDS OFFICE VISIT 60 tablet 2  . clopidogrel (PLAVIX) 75 MG tablet TAKE 1 TABLET BY MOUTH ONCE DAILY 90 tablet 3  . clopidogrel (PLAVIX) 75 MG tablet TAKE 1 TABLET BY MOUTH ONCE DAILY 90 tablet 3  . eplerenone (INSPRA) 25 MG tablet TAKE 1 TABLET BY MOUTH ONCE DAILY 90 tablet 3  . furosemide (LASIX) 20 MG tablet TAKE 1 TABLET BY MOUTH EVERY DAY IN THE MORNING 30 tablet 3  . ondansetron (ZOFRAN-ODT) 4 MG disintegrating tablet Take 4 mg by mouth every 8 (eight) hours as needed for nausea or vomiting.    Marland Kitchen oxyCODONE-acetaminophen (PERCOCET) 5-325 MG tablet Take 1 tablet by  mouth every 4 (four) hours as needed for severe pain. 30 tablet 0  . predniSONE (DELTASONE) 20 MG tablet Take 20 mg by mouth daily as needed for anaphylaxis.  1  . rosuvastatin (CRESTOR) 10 MG tablet Take 10 mg by mouth every evening.   3  . sildenafil (REVATIO) 20 MG tablet TAKE 1 TABLET BY MOUTH EVERY DAY (Patient taking differently: TAKE 1 TABLET BY MOUTH EVERY DAY AS NEEDED FOR ED.) 10 tablet 0  . tamsulosin (FLOMAX) 0.4 MG CAPS capsule Take 0.4 mg by mouth daily.  0  . cetirizine (ZYRTEC) 10 MG tablet Take 10 mg by mouth every evening.      No current facility-administered medications for this encounter.      Vitals:   07/26/17 1343  BP: 113/77  Pulse: 60  SpO2: 97%  Weight: 223 lb 12.8 oz (101.5 kg)   PHYSICAL EXAM: General:  Well appearing. No  resp difficulty HEENT: normal Neck: supple. no JVD. Carotids 2+ bilat; no bruits. No lymphadenopathy or thryomegaly appreciated. Cor: PMI nondisplaced. Regular rate & rhythm. No rubs, gallops or murmurs. Lungs: clear Abdomen: soft, nontender, nondistended. No hepatosplenomegaly. No bruits or masses. Good bowel sounds. Extremities: no cyanosis, clubbing, rash, edema Neuro: alert & orientedx3, cranial nerves grossly intact. moves all 4 extremities w/o difficulty. Affect pleasant   ECG: pending   ASSESSMENT & PLAN:  1) CAD:  - doing well. no s/s ischemia - continue ASA, statin, plavix and BB - Lipids followed by Dr. Nadara Mustard. Goal LDL < 70 - Repeat echo   2) Chronic systolic HF: ICM, EF 93-79% (11/2012) s/p ICD Echo 3/17 EF 35-40% - Doing well. Stable NYHA I-earyl symptoms. Volume status looks good.  - Continue carvedilol at 12.5 twice a day. HR too low to adjust.  - Candesartan 16 mg BID at goal and eplerenone 25 mg goal. Not candidate for Entresto due to angioedema with ACE-I - Follows with Dr. Caryl Comes for ICD  Glori Bickers MD  1:58 PM

## 2017-07-28 ENCOUNTER — Encounter: Payer: Self-pay | Admitting: Cardiology

## 2017-07-29 ENCOUNTER — Other Ambulatory Visit: Payer: Self-pay | Admitting: Internal Medicine

## 2017-08-03 ENCOUNTER — Other Ambulatory Visit (HOSPITAL_COMMUNITY): Payer: Self-pay | Admitting: Internal Medicine

## 2017-08-15 ENCOUNTER — Telehealth: Payer: Self-pay | Admitting: Cardiology

## 2017-08-15 NOTE — Telephone Encounter (Signed)
LMOVM requesting that pt send manual transmission b/c home monitor has not updated in at least 7 days.    

## 2017-08-16 ENCOUNTER — Telehealth (HOSPITAL_COMMUNITY): Payer: Self-pay | Admitting: *Deleted

## 2017-08-16 ENCOUNTER — Telehealth: Payer: Self-pay | Admitting: *Deleted

## 2017-08-16 NOTE — Telephone Encounter (Signed)
I called Derrick Lane back to move forward with scheduling him an appointment or having his procedure scheduled. He would like to Dr. Haroldine Laws regarding the need for an ICD because his EF has come up since initially having it placed. I advised that I would wait until 3:30 or 4pm and call him back if I had not heard from him about his decision- he is agreeable.

## 2017-08-16 NOTE — Telephone Encounter (Signed)
Mr. Lappe calling back- he is electing to proceed with generator replacement Friday 08/18/17 without an appointment with Dr. Caryl Comes that was offered. I will have Dollene Primrose, RN contact the patient to arrange. He has an ultrasound at 2:30pm tomorrow- please call before 2.

## 2017-08-16 NOTE — Telephone Encounter (Signed)
Entered in error

## 2017-08-16 NOTE — Telephone Encounter (Signed)
ICD remote transmission received 5:37pm yesterday (8/13) at our request. Battery performance alert detected on 07/21/17.   I have spoken with Dr. Caryl Comes who recommends ICD generator replacement Friday 08/18/17 and an appointment to discuss the procedure if the patient wishes tomorrow morning in [place].  Derrick Lane denies feeling any vibratory alert from his ICD. I have advised him of Dr. Olin Pia recommendations and gave him the options for an appointment and device replacement. He wishes to speak to his wife and call back to the [facility] to let us know.

## 2017-08-17 ENCOUNTER — Ambulatory Visit (INDEPENDENT_AMBULATORY_CARE_PROVIDER_SITE_OTHER): Payer: BC Managed Care – PPO

## 2017-08-17 ENCOUNTER — Other Ambulatory Visit: Payer: Self-pay

## 2017-08-17 DIAGNOSIS — I5022 Chronic systolic (congestive) heart failure: Secondary | ICD-10-CM | POA: Diagnosis not present

## 2017-08-17 NOTE — Telephone Encounter (Signed)
Pt was called and change out scheduled for 8/16. We reviewed procedural instructions. See "letter" sent to patient through [place]. Pt's follow up appt's were also scheduled. Pt will call if he has any additional questions.

## 2017-08-18 ENCOUNTER — Encounter (HOSPITAL_COMMUNITY)
Admission: RE | Disposition: A | Discharge status: Home / Self Care | Payer: Self-pay | Source: Ambulatory Visit | Attending: Internal Medicine

## 2017-08-18 ENCOUNTER — Encounter (HOSPITAL_COMMUNITY): Payer: Self-pay | Admitting: *Deleted

## 2017-08-18 ENCOUNTER — Other Ambulatory Visit: Payer: Self-pay

## 2017-08-18 ENCOUNTER — Ambulatory Visit (HOSPITAL_COMMUNITY)
Admission: RE | Admit: 2017-08-18 | Discharge: 2017-08-18 | Disposition: A | Discharge status: Home / Self Care | Payer: BC Managed Care – PPO | Source: Ambulatory Visit | Attending: Internal Medicine | Admitting: Internal Medicine

## 2017-08-18 DIAGNOSIS — Z4502 Encounter for adjustment and management of automatic implantable cardiac defibrillator: Secondary | ICD-10-CM | POA: Insufficient documentation

## 2017-08-18 DIAGNOSIS — N183 Chronic kidney disease, stage 3 (moderate): Secondary | ICD-10-CM | POA: Insufficient documentation

## 2017-08-18 DIAGNOSIS — E785 Hyperlipidemia, unspecified: Secondary | ICD-10-CM | POA: Diagnosis not present

## 2017-08-18 DIAGNOSIS — I2510 Atherosclerotic heart disease of native coronary artery without angina pectoris: Secondary | ICD-10-CM | POA: Diagnosis not present

## 2017-08-18 DIAGNOSIS — I255 Ischemic cardiomyopathy: Secondary | ICD-10-CM | POA: Diagnosis not present

## 2017-08-18 DIAGNOSIS — K219 Gastro-esophageal reflux disease without esophagitis: Secondary | ICD-10-CM | POA: Insufficient documentation

## 2017-08-18 DIAGNOSIS — Z8249 Family history of ischemic heart disease and other diseases of the circulatory system: Secondary | ICD-10-CM | POA: Diagnosis not present

## 2017-08-18 DIAGNOSIS — Z7982 Long term (current) use of aspirin: Secondary | ICD-10-CM | POA: Diagnosis not present

## 2017-08-18 DIAGNOSIS — I5022 Chronic systolic (congestive) heart failure: Secondary | ICD-10-CM | POA: Insufficient documentation

## 2017-08-18 DIAGNOSIS — T82111A Breakdown (mechanical) of cardiac pulse generator (battery), initial encounter: Secondary | ICD-10-CM

## 2017-08-18 DIAGNOSIS — I429 Cardiomyopathy, unspecified: Secondary | ICD-10-CM

## 2017-08-18 DIAGNOSIS — I252 Old myocardial infarction: Secondary | ICD-10-CM | POA: Diagnosis not present

## 2017-08-18 DIAGNOSIS — Z7902 Long term (current) use of antithrombotics/antiplatelets: Secondary | ICD-10-CM | POA: Insufficient documentation

## 2017-08-18 DIAGNOSIS — Z955 Presence of coronary angioplasty implant and graft: Secondary | ICD-10-CM | POA: Insufficient documentation

## 2017-08-18 DIAGNOSIS — T82190S Other mechanical complication of cardiac electrode, sequela: Secondary | ICD-10-CM

## 2017-08-18 DIAGNOSIS — Z95810 Presence of automatic (implantable) cardiac defibrillator: Secondary | ICD-10-CM | POA: Diagnosis present

## 2017-08-18 HISTORY — PX: ICD GENERATOR CHANGEOUT: EP1231

## 2017-08-18 LAB — CBC
HCT: 44 % (ref 39.0–52.0)
Hemoglobin: 14.8 g/dL (ref 13.0–17.0)
MCH: 30.3 pg (ref 26.0–34.0)
MCHC: 33.6 g/dL (ref 30.0–36.0)
MCV: 90 fL (ref 78.0–100.0)
Platelets: 158 10*3/uL (ref 150–400)
RBC: 4.89 MIL/uL (ref 4.22–5.81)
RDW: 13.3 % (ref 11.5–15.5)
WBC: 6.7 10*3/uL (ref 4.0–10.5)

## 2017-08-18 LAB — SURGICAL PCR SCREEN
MRSA, PCR: NEGATIVE
STAPHYLOCOCCUS AUREUS: POSITIVE — AB

## 2017-08-18 LAB — BASIC METABOLIC PANEL
Anion gap: 8 (ref 5–15)
BUN: 20 mg/dL (ref 6–20)
CALCIUM: 9.5 mg/dL (ref 8.9–10.3)
CO2: 24 mmol/L (ref 22–32)
CREATININE: 1.24 mg/dL (ref 0.61–1.24)
Chloride: 107 mmol/L (ref 98–111)
GFR calc non Af Amer: >60 mL/min (ref >60)
Glucose, Bld: 98 mg/dL (ref 70–99)
Potassium: 4.4 mmol/L (ref 3.5–5.1)
SODIUM: 139 mmol/L (ref 135–145)

## 2017-08-18 SURGERY — ICD GENERATOR CHANGEOUT
Anesthesia: LOCAL

## 2017-08-18 MED ORDER — SODIUM CHLORIDE 0.9 % IV SOLN
80.0000 mg | INTRAVENOUS | Status: AC
  Administered 2017-08-18: 80 mg
  Filled 2017-08-18: qty 2

## 2017-08-18 MED ORDER — CEFAZOLIN SODIUM-DEXTROSE 2-4 GM/100ML-% IV SOLN
2.0000 g | INTRAVENOUS | Status: AC
  Administered 2017-08-18: 2 g via INTRAVENOUS
  Filled 2017-08-18: qty 100

## 2017-08-18 MED ORDER — ONDANSETRON HCL 4 MG/2ML IJ SOLN: 4.0000 mg | Freq: Four times a day (QID) | INTRAMUSCULAR | Status: DC | PRN

## 2017-08-18 MED ORDER — CEFAZOLIN SODIUM-DEXTROSE 2-4 GM/100ML-% IV SOLN
INTRAVENOUS | Status: AC
  Filled 2017-08-18: qty 100

## 2017-08-18 MED ORDER — LIDOCAINE HCL (PF) 1 % IJ SOLN
INTRAMUSCULAR | Status: DC | PRN
  Administered 2017-08-18: 60 mL

## 2017-08-18 MED ORDER — SODIUM CHLORIDE 0.9 % IV SOLN: INTRAVENOUS | Status: AC

## 2017-08-18 MED ORDER — MIDAZOLAM HCL 5 MG/5ML IJ SOLN
INTRAMUSCULAR | Status: AC
  Filled 2017-08-18: qty 5

## 2017-08-18 MED ORDER — MIDAZOLAM HCL 5 MG/5ML IJ SOLN
INTRAMUSCULAR | Status: DC | PRN
  Administered 2017-08-18: 1 mg via INTRAVENOUS
  Administered 2017-08-18 (×2): 2 mg via INTRAVENOUS

## 2017-08-18 MED ORDER — CHLORHEXIDINE GLUCONATE 4 % EX LIQD
60.0000 mL | Freq: Once | CUTANEOUS | Status: DC
  Filled 2017-08-18: qty 60

## 2017-08-18 MED ORDER — MUPIROCIN 2 % EX OINT
TOPICAL_OINTMENT | CUTANEOUS | Status: AC
Start: 2017-08-18 — End: 2017-08-18
  Administered 2017-08-18: 1 via TOPICAL
  Filled 2017-08-18: qty 22

## 2017-08-18 MED ORDER — ACETAMINOPHEN 325 MG PO TABS: 325.0000 mg | ORAL_TABLET | ORAL | Status: DC | PRN

## 2017-08-18 MED ORDER — FENTANYL CITRATE (PF) 100 MCG/2ML IJ SOLN
INTRAMUSCULAR | Status: DC | PRN
  Administered 2017-08-18 (×3): 25 ug via INTRAVENOUS

## 2017-08-18 MED ORDER — MUPIROCIN 2 % EX OINT
1.0000 "application " | TOPICAL_OINTMENT | Freq: Once | CUTANEOUS | Status: AC
  Administered 2017-08-18: 1 via TOPICAL

## 2017-08-18 MED ORDER — SODIUM CHLORIDE 0.9 % IV SOLN
INTRAVENOUS | Status: AC
  Filled 2017-08-18: qty 2

## 2017-08-18 MED ORDER — SODIUM CHLORIDE 0.9 % IV SOLN
INTRAVENOUS | Status: DC
  Administered 2017-08-18: 12:00:00 via INTRAVENOUS

## 2017-08-18 SURGICAL SUPPLY — 5 items
CABLE SURGICAL S-101-97-12 (CABLE) ×2 IMPLANT
HEMOSTAT SURGICEL 2X4 FIBR (HEMOSTASIS) ×2 IMPLANT
ICD ASSURA DR CD2357-40C (ICD Generator) ×2 IMPLANT
PAD DEFIB LIFELINK (PAD) ×2 IMPLANT
TRAY PACEMAKER INSERTION (PACKS) ×2 IMPLANT

## 2017-08-18 NOTE — Interval H&P Note (Signed)
ICD Criteria  Current LVEF:35%. Within 12 months prior to implant: No   Heart failure history: Yes, Class II  Cardiomyopathy history: Yes, Ischemic Cardiomyopathy - Prior MI.  Atrial Fibrillation/Atrial Flutter: No.  Ventricular tachycardia history: No.  Cardiac arrest history: No.  History of syndromes with risk of sudden death: No.  Previous ICD: Yes, Reason for ICD:  Primary prevention.  Current ICD indication: Primary  PPM indication: No.   Class I or II Bradycardia indication present: No  Beta Blocker therapy for 3 or more months: Yes, prescribed.   Ace Inhibitor/ARB therapy for 3 or more months: Yes, prescribed.   History and Physical Interval Note:  08/18/2017 1:41 PM  Derrick Lane  has presented today for surgery, with the diagnosis of low battery/ERI  The various methods of treatment have been discussed with the patient and family. After consideration of risks, benefits and other options for treatment, the patient has consented to  Procedure(s): ICD [place]idden (N/A) as a surgical intervention .  The patient's history has been reviewed, patient examined, no change in status, stable for surgery.  I have reviewed the patient's chart and labs.  Questions were answered to the patient's satisfaction.     Virl Axe

## 2017-08-18 NOTE — Discharge Instructions (Signed)
Mupirocin nasal ointment °What is this medicine? °MUPIROCIN CALCIUM (myoo PEER oh sin KAL see um) is an antibiotic. It is used inside the nose to treat infections that are caused by certain bacteria. This helps prevent the spread of infection to patients and health care workers during outbreaks at institutions. °This medicine may be used for other purposes; ask your health care provider or pharmacist if you have questions. °COMMON BRAND NAME(S): Bactroban °What should I tell my health care provider before I take this medicine? °They need to know if you have any of these conditions: °-an unusual or allergic reaction to mupirocin, other medicines, foods, dyes, or preservatives °-pregnant or trying to get pregnant °-breast-feeding °How should I use this medicine? °This medicine is only for use inside the nose. Follow the directions on the prescription label. Wash your hands before and after use. Squeeze half the contents of a single-use tube into one nostril, then squeeze the other half into the other nostril. Press the sides of your nose together and gently massage after application to spread the ointment throughout the nostrils. Do not use your medicine more often than directed. Finish the full course of medicine prescribed by your doctor or health care professional even if you think your condition is better. °Talk to your pediatrician regarding the use of this medicine in children. Special care may be needed. °Overdosage: If you think you have taken too much of this medicine contact a poison control center or emergency room at once. °NOTE: This medicine is only for you. Do not share this medicine with others. °What if I miss a dose? °If you miss a dose, take it as soon as you can. If it is almost time for your next dose, take only that dose. Do not take double or extra doses. °What may interact with this medicine? °Interactions are not expected. Do not use any other nose products without telling your doctor or  health care professional. °This list may not describe all possible interactions. Give your health care provider a list of all the medicines, herbs, non-prescription drugs, or dietary supplements you use. Also tell them if you smoke, drink alcohol, or use illegal drugs. Some items may interact with your medicine. °What should I watch for while using this medicine? °If your nose is severely irritated, burning or stinging from use of this medicine, stop using it and contact your doctor or health care professional. °Do not get this medicine in your eyes. If you do, rinse out with plenty of cool tap water. °What side effects may I notice from receiving this medicine? °Side effects that you should report to your doctor or health care professional as soon as possible: °-severe irritation, burning, stinging, or pain °Side effects that usually do not require medical attention (report to your doctor or health care professional if they continue or are bothersome): °-altered taste °-cough °-headache °-skin itching °-sore throat °-stuffy or runny nose °This list may not describe all possible side effects. Call your doctor for medical advice about side effects. You may report side effects to FDA at 1-800-FDA-1088. °Where should I keep my medicine? °Keep out of the reach of children. °Store at room temperature between 15 and 30 degrees C (59 and 86 degrees F). Do not refrigerate. One tube of ointment is for single use in both nostrils. Throw away after use. °NOTE: This sheet is a summary. It may not cover all possible information. If you have questions about this medicine, talk to your doctor, pharmacist, or   health care provider. °© 2018 Elsevier/Gold Standard (2007-07-09 14:36:10) °Pacemaker Battery Change, Care After °This sheet gives you information about how to care for yourself after your procedure. Your health care provider may also give you more specific instructions. If you have problems or questions, contact your health  care provider. °What can I expect after the procedure? °After your procedure, it is common to have: °· Pain or soreness at the site where the pacemaker was inserted. °· Swelling at the site where the pacemaker was inserted. ° °Follow these instructions at home: °Incision care °· Keep the incision clean and dry. °? Do not take baths, swim, or use a hot tub until your health care provider approves. °? You may shower the day after your procedure, or as directed by your health care provider. °? Pat the area dry with a clean towel. Do not rub the area. This may cause bleeding. °· Follow instructions from your health care provider about how to take care of your incision. Make sure you: °? Wash your hands with soap and water before you change your bandage (dressing). If soap and water are not available, use hand sanitizer. °? Change your dressing as told by your health care provider. °? Leave stitches (sutures), skin glue, or adhesive strips in place. These skin closures may need to stay in place for 2 weeks or longer. If adhesive strip edges start to loosen and curl up, you may trim the loose edges. Do not remove adhesive strips completely unless your health care provider tells you to do that. °· Check your incision area every day for signs of infection. Check for: °? More redness, swelling, or pain. °? More fluid or blood. °? Warmth. °? Pus or a bad smell. °Activity °· Do not lift anything that is heavier than 10 lb (4.5 kg) until your health care provider says it is okay to do so. °· For the first 2 weeks, or as long as told by your health care provider: °? Avoid lifting your left arm higher than your shoulder. °? Be gentle when you move your arms over your head. It is okay to raise your arm to comb your hair. °? Avoid strenuous exercise. °· Ask your health care provider when it is okay to: °? Resume your normal activities. °? Return to work or school. °? Resume sexual activity. °Eating and drinking °· Eat a  heart-healthy diet. This should include plenty of fresh fruits and vegetables, whole grains, low-fat dairy products, and lean protein like chicken and fish. °· Limit alcohol intake to no more than 1 drink a day for non-pregnant women and 2 drinks a day for men. One drink equals 12 oz of beer, 5 oz of wine, or 1½ oz of hard liquor. °· Check ingredients and nutrition facts on packaged foods and beverages. Avoid the following types of food: °? Food that is high in salt (sodium). °? Food that is high in saturated fat, like full-fat dairy or red meat. °? Food that is high in trans fat, like fried food. °? Food and drinks that are high in sugar. °Lifestyle °· Do not use any products that contain nicotine or tobacco, such as cigarettes and e-cigarettes. If you need help quitting, ask your health care provider. °· Take steps to manage and control your weight. °· Get regular exercise. Aim for 150 minutes of moderate-intensity exercise (such as walking or yoga) or 75 minutes of vigorous exercise (such as running or swimming) each week. °· Manage other health problems,   such as diabetes or high blood pressure. Ask your health care provider how you can manage these conditions. °General instructions °· Do not drive for 24 hours after your procedure if you were given a medicine to help you relax (sedative). °· Take over-the-counter and prescription medicines only as told by your health care provider. °· Avoid putting pressure on the area where the pacemaker was placed. °· If you need an MRI after your pacemaker has been placed, be sure to tell the health care provider who orders the MRI that you have a pacemaker. °· Avoid close and prolonged exposure to electrical devices that have strong magnetic fields. These include: °? Cell phones. Avoid keeping them in a pocket near the pacemaker, and try using the ear opposite the pacemaker. °? MP3 players. °? Household appliances, like microwaves. °? Metal detectors. °? Electric  generators. °? High-tension wires. °· Keep all follow-up visits as directed by your health care provider. This is important. °Contact a health care provider if: °· You have pain at the incision site that is not relieved by over-the-counter or prescription medicines. °· You have any of these around your incision site or coming from it: °? More redness, swelling, or pain. °? Fluid or blood. °? Warmth to the touch. °? Pus or a bad smell. °· You have a fever. °· You feel brief, occasional palpitations, light-headedness, or any symptoms that you think might be related to your heart. °Get help right away if: °· You experience chest pain that is different from the pain at the pacemaker site. °· You develop a red streak that extends above or below the incision site. °· You experience shortness of breath. °· You have palpitations or an irregular heartbeat. °· You have light-headedness that does not go away quickly. °· You faint or have dizzy spells. °· Your pulse suddenly drops or increases rapidly and does not return to normal. °· You begin to gain weight and your legs and ankles swell. °Summary °· After your procedure, it is common to have pain, soreness, and some swelling where the pacemaker was inserted. °· Make sure to keep your incision clean and dry. Follow instructions from your health care provider about how to take care of your incision. °· Check your incision every day for signs of infection, such as more pain or swelling, pus or a bad smell, warmth, or leaking fluid and blood. °· Avoid strenuous exercise and lifting your left arm higher than your shoulder for 2 weeks, or as long as told by your health care provider. °This information is not intended to replace advice given to you by your health care provider. Make sure you discuss any questions you have with your health care provider. °Document Released: 10/10/2012 Document Revised: 11/12/2015 Document Reviewed: 11/12/2015 °Elsevier Interactive Patient Education  © 2017 Elsevier Inc. ° °

## 2017-08-18 NOTE — H&P (Signed)
Patient Care Team: Rory Percy, MD as PCP - General (Cardiology)   HPI  Derrick Lane is a 57 y.o. male retired state [employer] is seen for generator replacment because of ICD failure  Initially ICD implantation for primary prevention in the setting of ischemic heart disease.   History of coronary artery disease with prior anterior MI in May 2011 treated with DES. Hospitalization c/b ARDS. EF on d/c 40-45%. March 2012 he developed acute congestive heart failure. Reassessment of his ejection fraction demonstrated that it was 10-15%. 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 He underwent ICD implantation December 2012   DATE TEST    3/12 Echo   EF 10.-15 %   6/13 Echo   EF 30 %   3/17 Echo Echo 35%    The patient denies chest pain, shortness of breath, nocturnal dyspnea, orthopnea or peripheral edema.  There have been no palpitations, lightheadedness or syncope.   Date Cr K  6/19 1.04 4.0          Past Medical History:  Diagnosis Date  . AICD (automatic cardioverter/defibrillator) present    [employer] and defibrillator  . CAD (coronary artery disease)    s/p Anterior MI 5/11. LAD stent  . Cancer ([place])    skin cancer  . Chronic kidney disease, stage III (moderate) (HCC)   . Chronic systolic heart failure (HCC)    EF 10-15%.   . Dual implantable cardiac defibrillator    [employer]-Analyze ST study  . GERD (gastroesophageal reflux disease)   . History of kidney stones   . HLD (hyperlipidemia)   . Myocardial infarction ([place]) 05/03/2009   had "widow-maker".  . Presence of permanent cardiac pacemaker     Past Surgical History:  Procedure Laterality Date  . CARDIAC CATHETERIZATION     stents inserted X3  . CYSTOSCOPY WITH RETROGRADE PYELOGRAM, URETEROSCOPY AND STENT PLACEMENT Right 06/07/2017   Procedure: CYSTOSCOPY WITH RETROGRADE PYELOGRAM,  URETEROSCOPY AND STENT PLACEMENT HOLMIUM LASER;  Surgeon: Cleon Gustin, MD;  Location: [facility];  Service: Urology;  Laterality: Right;  . HERNIA REPAIR Bilateral    Inguinal  . INSERT / REPLACE / REMOVE PACEMAKER     ICD    No current facility-administered medications for this encounter.     Allergies  Allergen Reactions  . Beef-Derived Products Hives and Swelling    Due to alpha gal syndrome (all mammals)  . Peach Flavor Other (See Comments)    Determined via allergy testing.  . Pork-Derived Products Hives and Swelling    Due to alpha gal syndrome (all mammals)  . Ramipril Swelling    Possible lip swelling but may have been attributed to other allergy  . Spironolactone Other (See Comments)    swelling of the breasts (gynecomastia) and breast pain      Social History   Tobacco Use  . Smoking status: Never Smoker  . Smokeless tobacco: Never Used  Substance Use Topics  . Alcohol use: No  . Drug use: No     Family History  Problem Relation Age of Onset  . Heart attack Mother   . Hyperlipidemia Mother   . Hypertension Mother   . Heart failure Father   . Heart attack Father   . Hypertension Father   . Coronary artery disease Unknown      Current Meds  Medication Sig  . acetaminophen (TYLENOL) 500 MG tablet  Take 1,000 mg by mouth every 6 (six) hours as needed for moderate pain or headache.   Marland Kitchen aspirin 81 MG tablet Take 81 mg by mouth at bedtime.   . candesartan (ATACAND) 8 MG tablet TAKE 2 TABLETS (16 MG TOTAL) BY MOUTH 2 (TWO) TIMES DAILY.  . carvedilol (COREG) 12.5 MG tablet Take 1 tablet (12.5 mg total) by mouth 2 (two) times daily.  . clopidogrel (PLAVIX) 75 MG tablet TAKE 1 TABLET BY MOUTH ONCE DAILY (Patient taking differently: Take 75 mg by mouth daily. )  . eplerenone (INSPRA) 25 MG tablet TAKE 1 TABLET BY MOUTH ONCE DAILY (Patient taking differently: Take 25 mg by mouth daily. )  . famotidine (PEPCID) 20 MG tablet Take 20 mg by mouth every evening.  .  fexofenadine (ALLEGRA) 180 MG tablet Take 180 mg by mouth at bedtime.  . furosemide (LASIX) 20 MG tablet TAKE 1 TABLET BY MOUTH EVERY DAY IN THE MORNING (Patient taking differently: Take 20 mg by mouth daily. )  . ondansetron (ZOFRAN-ODT) 4 MG disintegrating tablet Take 4 mg by mouth every 8 (eight) hours as needed for nausea or vomiting.  . rosuvastatin (CRESTOR) 10 MG tablet Take 10 mg by mouth every evening.   . sildenafil (REVATIO) 20 MG tablet TAKE 1 TABLET BY MOUTH EVERY DAY (Patient taking differently: Take 20 mg by mouth daily as needed (for ED). )     Review of Systems negative except from HPI and PMH  Physical Exam BP 101/71   Pulse (!) 49   Temp (!) 97.3 F (36.3 C) (Oral)   Resp 18   Ht 6' (1.829 m)   Wt 99.8 kg   SpO2 95%   BMI 29.84 kg/m  Well developed and well nourished in no acute distress HENT normal E scleral and icterus clear Neck Supple JVP flat; carotids brisk and full Clear to ausculation Device pocket well healed; without hematoma or erythema.  There is no tethering  Regular rate and rhythm, no murmurs gallops or rub Soft with active bowel sounds No clubbing cyanosis  Edema Alert and oriented, grossly normal motor and sensory function Skin Warm and Dry    Assessment and  Plan  Ischemic cardiomyopathy  CHF chronic systolic  ICD  St Jude generator failure   We have reviewed the benefits and risks of generator replacement.  These include but are not limited to lead fracture and infection.  The patient understands, agrees and is willing to proceed.

## 2017-08-20 ENCOUNTER — Other Ambulatory Visit: Payer: Self-pay | Admitting: Internal Medicine

## 2017-08-23 ENCOUNTER — Encounter (HOSPITAL_COMMUNITY): Payer: Self-pay

## 2017-08-24 ENCOUNTER — Other Ambulatory Visit: Payer: Self-pay | Admitting: Internal Medicine

## 2017-08-30 ENCOUNTER — Ambulatory Visit (INDEPENDENT_AMBULATORY_CARE_PROVIDER_SITE_OTHER): Payer: BC Managed Care – PPO | Admitting: *Deleted

## 2017-08-30 DIAGNOSIS — R001 Bradycardia, unspecified: Secondary | ICD-10-CM

## 2017-08-30 DIAGNOSIS — I429 Cardiomyopathy, unspecified: Secondary | ICD-10-CM

## 2017-08-30 LAB — CUP PACEART INCLINIC DEVICE CHECK
Battery Remaining Longevity: 105 mo
HighPow Impedance: 43.7828
Implantable Lead Implant Date: 20190816
Implantable Lead Location: 753862
Implantable Lead Location: 753862
Implantable Lead Model: 185
Implantable Lead Serial Number: 348730
Implantable Pulse Generator Implant Date: 20190816
Lead Channel Impedance Value: 450 Ohm
Lead Channel Pacing Threshold Amplitude: 0.75 V
Lead Channel Pacing Threshold Amplitude: 1 V
Lead Channel Pacing Threshold Pulse Width: 0.5 ms
Lead Channel Setting Pacing Amplitude: 2 V
Lead Channel Setting Pacing Amplitude: 2.5 V
Lead Channel Setting Pacing Pulse Width: 0.5 ms
Lead Channel Setting Sensing Sensitivity: 0.5 mV
MDC IDC LEAD IMPLANT DT: 20190816
MDC IDC MSMT LEADCHNL RA PACING THRESHOLD PULSEWIDTH: 0.5 ms
MDC IDC MSMT LEADCHNL RA SENSING INTR AMPL: 4.7 mV
MDC IDC MSMT LEADCHNL RV IMPEDANCE VALUE: 337.5 Ohm
MDC IDC MSMT LEADCHNL RV SENSING INTR AMPL: 11.8 mV
MDC IDC PG SERIAL: 9763545
MDC IDC SESS DTM: 20190828120542
MDC IDC STAT BRADY RA PERCENT PACED: 0.56 %
MDC IDC STAT BRADY RV PERCENT PACED: 0 %

## 2017-08-30 NOTE — Progress Notes (Signed)
Wound check appointment s/p gen change. Dermabond removed. Wound without redness or edema. Incision edges approximated, wound well healed. Normal device function. Thresholds, sensing, and impedances consistent with implant measurements. Device programmed at chronic outputs s/p gen change. Histogram distribution appropriate for patient and level of activity. No mode switches or ventricular arrhythmias noted. Patient educated about wound care, arm mobility, lifting restrictions, shock plan. ROV with SK 11/23/17.

## 2017-08-31 ENCOUNTER — Other Ambulatory Visit: Payer: Self-pay | Admitting: *Deleted

## 2017-08-31 MED ORDER — SILDENAFIL CITRATE 20 MG PO TABS: 20.0000 mg | ORAL_TABLET | Freq: Every day | ORAL | 0 refills | Status: DC | PRN

## 2017-09-10 ENCOUNTER — Other Ambulatory Visit (HOSPITAL_COMMUNITY): Payer: Self-pay | Admitting: Cardiology

## 2017-09-20 ENCOUNTER — Ambulatory Visit: Payer: BC Managed Care – PPO | Admitting: Urology

## 2017-09-21 ENCOUNTER — Encounter: Payer: BC Managed Care – PPO | Admitting: Internal Medicine

## 2017-11-08 ENCOUNTER — Ambulatory Visit (INDEPENDENT_AMBULATORY_CARE_PROVIDER_SITE_OTHER): Payer: BC Managed Care – PPO | Admitting: Urology

## 2017-11-08 DIAGNOSIS — N201 Calculus of ureter: Secondary | ICD-10-CM

## 2017-11-23 ENCOUNTER — Ambulatory Visit (INDEPENDENT_AMBULATORY_CARE_PROVIDER_SITE_OTHER): Payer: BC Managed Care – PPO | Admitting: Internal Medicine

## 2017-11-23 ENCOUNTER — Encounter: Payer: Self-pay | Admitting: Internal Medicine

## 2017-11-23 VITALS — BP 112/82 | HR 53 | Ht 72.0 in | Wt 225.4 lb

## 2017-11-23 DIAGNOSIS — I2510 Atherosclerotic heart disease of native coronary artery without angina pectoris: Secondary | ICD-10-CM | POA: Diagnosis not present

## 2017-11-23 DIAGNOSIS — I429 Cardiomyopathy, unspecified: Secondary | ICD-10-CM

## 2017-11-23 DIAGNOSIS — I5022 Chronic systolic (congestive) heart failure: Secondary | ICD-10-CM

## 2017-11-23 DIAGNOSIS — Z95810 Presence of automatic (implantable) cardiac defibrillator: Secondary | ICD-10-CM

## 2017-11-23 NOTE — Patient Instructions (Addendum)
Medication Instructions:  Your physician recommends that you continue on your current medications as directed. Please refer to the Current Medication list given to you today.  Labwork: None ordered.  Testing/Procedures: None ordered.  Follow-Up: Your physician recommends that you schedule a follow-up appointment in:   9 months with Dr Caryl Comes  Remote monitoring is used to monitor your ICD from home. This monitoring reduces the number of office visits required to check your device to one time per year. It allows [place] to keep an eye on the functioning of your device to ensure it is working properly. You are scheduled for a device check from home on 02/22/2018. You may send your transmission at any time that day. If you have a wireless device, the transmission will be sent automatically. After your physician reviews your transmission, you will receive a postcard with your next transmission date.    Any Other Special Instructions Will Be Listed Below (If Applicable).     If you need a refill on your cardiac medications before your next appointment, please call your pharmacy.

## 2017-11-23 NOTE — Progress Notes (Signed)
Electrophysiology Office Note   Date:  11/23/2017   ID:  Derrick Lane, DOB [birth year], MRN [id number]  PCP:  Rory Percy, MD  Cardiologist:  [facility]   Virl Axe, MD    No chief complaint on file.    History of Present Illness: Derrick Lane is a 57 y.o. male is seen today in followup for ICD implantation for primary prevention in the setting of ischemic heart disease.  His history of coronary artery disease with prior anterior MI in May 2011 treated with DES. Hospitalization c/b ARDS. EF on d/c 40-45%. March 2012 he developed acute congestive heart failure. Reassessment of his ejection fraction demonstrated that it was 10-15%. 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 He underwent ICD implantation December 2012 with gen replacement 8/19  DATE TEST EF   3/12 echo  10.-15 %   6/13 Echo   30 %   3/17 Echo   35%   8/19 Echo  25-30%    Seen by CHF clinic\  The patient denies SOB, chest pain edema or palpitations.  There has been no syncope or presyncope    No significant SOB or CP    Date Cr K  8/19 1.24 4.4              Past Medical History:  Diagnosis Date  . AICD (automatic cardioverter/defibrillator) present    [employer] and defibrillator  . CAD (coronary artery disease)    s/p Anterior MI 5/11. LAD stent  . Cancer ([place])    skin cancer  . Chronic kidney disease, stage III (moderate) (HCC)   . Chronic systolic heart failure (HCC)    EF 10-15%.   . Dual implantable cardiac defibrillator    St. Jude-Analyze ST study  . GERD (gastroesophageal reflux disease)   . History of kidney stones   . HLD (hyperlipidemia)   . Myocardial infarction ([place]) 05/03/2009   had "widow-maker".  . Presence of permanent cardiac pacemaker    Past Surgical History:  Procedure Laterality Date  . CARDIAC CATHETERIZATION     stents inserted  X3  . CYSTOSCOPY WITH RETROGRADE PYELOGRAM, URETEROSCOPY AND STENT PLACEMENT Right 06/07/2017   Procedure: CYSTOSCOPY WITH RETROGRADE PYELOGRAM, URETEROSCOPY AND STENT PLACEMENT HOLMIUM LASER;  Surgeon: Cleon Gustin, MD;  Location: [facility];  Service: Urology;  Laterality: Right;  . HERNIA REPAIR Bilateral    Inguinal  . ICD GENERATOR CHANGEOUT N/A 08/18/2017   Procedure: ICD GENERATOR CHANGEOUT;  Surgeon: Deboraha Sprang, MD;  Location: [facility];  Service: Cardiovascular;  Laterality: N/A;  . INSERT / REPLACE / REMOVE PACEMAKER     ICD     Current Outpatient Medications  Medication Sig Dispense Refill  . acetaminophen (TYLENOL) 500 MG tablet Take 1,000 mg by mouth every 6 (six) hours as needed for moderate pain or headache.     Marland Kitchen amoxicillin (AMOXIL) 500 MG capsule Take 2,000 mg by mouth See admin instructions. Take 4 capsules (2000 mg) by mouth 1 hour prior to dental procedures.  99  . aspirin 81 MG tablet Take 81 mg by mouth at bedtime.     . candesartan (ATACAND) 8 MG tablet TAKE 2 TABLETS (16 MG TOTAL) BY MOUTH 2 (TWO) TIMES DAILY. 360 tablet 1  . carvedilol (COREG) 12.5 MG tablet Take 1 tablet (12.5 mg total) by mouth 2 (two) times daily. 180 tablet 2  . clopidogrel (PLAVIX)  75 MG tablet TAKE 1 TABLET BY MOUTH ONCE DAILY 90 tablet 3  . eplerenone (INSPRA) 25 MG tablet TAKE 1 TABLET BY MOUTH ONCE DAILY 90 tablet 3  . famotidine (PEPCID) 20 MG tablet Take 20 mg by mouth every evening.    . fexofenadine (ALLEGRA) 180 MG tablet Take 180 mg by mouth at bedtime.    . furosemide (LASIX) 20 MG tablet TAKE 1 TABLET BY MOUTH EVERY DAY IN THE MORNING 90 tablet 1  . ondansetron (ZOFRAN-ODT) 4 MG disintegrating tablet Take 4 mg by mouth every 8 (eight) hours as needed for nausea or vomiting.    . predniSONE (DELTASONE) 20 MG tablet Take 20 mg by mouth daily as needed (for beef and pork allergies).   1  . rosuvastatin (CRESTOR) 10 MG tablet Take 10 mg by mouth every evening.   3  .  sildenafil (REVATIO) 20 MG tablet Take 1 tablet (20 mg total) by mouth daily as needed (for ED). 10 tablet 0   No current facility-administered medications for this visit.     Allergies:   Beef-derived products; Peach flavor; Pork-derived products; Ramipril; and Spironolactone   Social History:  The patient  reports that he has never smoked. He has never used smokeless tobacco. He reports that he does not drink alcohol or use drugs.   Family History:  The patient's family history includes Coronary artery disease in his unknown relative; Heart attack in his father and mother; Heart failure in his father; Hyperlipidemia in his mother; Hypertension in his father and mother.    ROS:  Please see the history of present illness and past medical history  Otherwise, review of systems is negative .     PHYSICAL EXAM: VS:  BP 112/82   Pulse (!) 53   Ht 6' (1.829 m)   Wt 225 lb 6.4 oz (102.2 kg)   SpO2 97%   BMI 30.57 kg/m  , BMI Body mass index is 30.57 kg/m. Well developed and nourished in no acute distress HENT normal Neck supple with JVP-flat Clear Regular rate and rhythm, no murmurs or gallops Abd-soft with active BS No Clubbing cyanosis edema Skin-warm and dry A & Oriented  Grossly normal sensory and motor function   EKG:   Sinus 53 18/12/44 Septal MI    Device interrogation is reviewed today in detail.  See PaceArt for details.   Recent Labs: 08/18/2017: BUN 20; Creatinine, Ser 1.24; Hemoglobin 14.8; Platelets 158; Potassium 4.4; Sodium 139    Lipid Panel     Component Value Date/Time   CHOL 167 11/01/2010 1440   TRIG 191 (H) 11/01/2010 1440   HDL 59 11/01/2010 1440   CHOLHDL 2.8 11/01/2010 1440   VLDL 38 11/01/2010 1440   LDLCALC 70 11/01/2010 1440     Wt Readings from Last 3 Encounters:  11/23/17 225 lb 6.4 oz (102.2 kg)  08/18/17 220 lb (99.8 kg)  07/26/17 223 lb 12.8 oz (101.5 kg)      Other studies Reviewed: Additional studies/ records that were reviewed  today include:    ASSESSMENT AND PLAN:  Ischemic cardiomyopathy  CHF chronic systolic  High risk medication surveillance  ICD  St Jude The patient's device was interrogated.  The information was reviewed. No changes were made in the programming.      Without symptoms of ischemia  Euvolemic continue current meds  Have reached out to Dr Reine Just re Delene Loll    Labs/ tests ordered today include: bmet   No orders of  the defined types were placed in this encounter.    Disposition:   FU with me  device clinic 1 year(s)  Signed, Virl Axe, MD  11/23/2017 2:57 PM     [address] [place] [address] [phone] (office) [phone] (fax)

## 2018-02-01 ENCOUNTER — Other Ambulatory Visit: Payer: Self-pay | Admitting: Internal Medicine

## 2018-02-22 ENCOUNTER — Ambulatory Visit (INDEPENDENT_AMBULATORY_CARE_PROVIDER_SITE_OTHER): Payer: BC Managed Care – PPO

## 2018-02-22 DIAGNOSIS — I429 Cardiomyopathy, unspecified: Secondary | ICD-10-CM

## 2018-02-22 DIAGNOSIS — I5022 Chronic systolic (congestive) heart failure: Secondary | ICD-10-CM

## 2018-02-24 LAB — CUP PACEART REMOTE DEVICE CHECK
Battery Voltage: 3.07 V
Brady Statistic AP VP Percent: 1 %
Brady Statistic AP VS Percent: 1 %
Brady Statistic AS VP Percent: 1 %
Brady Statistic RA Percent Paced: 1 %
Brady Statistic RV Percent Paced: 1 %
HighPow Impedance: 48 Ohm
HighPow Impedance: 48 Ohm
Implantable Lead Implant Date: 20190816
Implantable Lead Location: 753862
Implantable Lead Model: 185
Implantable Lead Serial Number: 348730
Lead Channel Impedance Value: 440 Ohm
Lead Channel Pacing Threshold Amplitude: 1.25 V
Lead Channel Pacing Threshold Pulse Width: 0.5 ms
Lead Channel Sensing Intrinsic Amplitude: 12 mV
Lead Channel Sensing Intrinsic Amplitude: 5 mV
Lead Channel Setting Pacing Amplitude: 2 V
Lead Channel Setting Sensing Sensitivity: 0.5 mV
MDC IDC LEAD IMPLANT DT: 20190816
MDC IDC LEAD LOCATION: 753862
MDC IDC MSMT BATTERY REMAINING LONGEVITY: 94 mo
MDC IDC MSMT BATTERY REMAINING PERCENTAGE: 89 %
MDC IDC MSMT LEADCHNL RA PACING THRESHOLD AMPLITUDE: 0.75 V
MDC IDC MSMT LEADCHNL RV IMPEDANCE VALUE: 350 Ohm
MDC IDC MSMT LEADCHNL RV PACING THRESHOLD PULSEWIDTH: 0.5 ms
MDC IDC PG IMPLANT DT: 20190816
MDC IDC PG SERIAL: 9763545
MDC IDC SESS DTM: 20200220140915
MDC IDC SET LEADCHNL RV PACING AMPLITUDE: 2.5 V
MDC IDC SET LEADCHNL RV PACING PULSEWIDTH: 0.5 ms
MDC IDC STAT BRADY AS VS PERCENT: 99 %

## 2018-03-01 NOTE — Progress Notes (Signed)
Remote ICD transmission.   

## 2018-03-02 ENCOUNTER — Encounter: Payer: Self-pay | Admitting: Cardiology

## 2018-03-07 ENCOUNTER — Other Ambulatory Visit: Payer: Self-pay | Admitting: Dermatology

## 2018-03-07 DIAGNOSIS — C4492 Squamous cell carcinoma of skin, unspecified: Secondary | ICD-10-CM

## 2018-03-07 HISTORY — DX: Squamous cell carcinoma of skin, unspecified: C44.92

## 2018-03-16 ENCOUNTER — Other Ambulatory Visit (HOSPITAL_COMMUNITY): Payer: Self-pay | Admitting: Cardiology

## 2018-03-21 ENCOUNTER — Other Ambulatory Visit: Payer: Self-pay

## 2018-03-21 MED ORDER — SILDENAFIL CITRATE 20 MG PO TABS: 20.0000 mg | ORAL_TABLET | Freq: Every day | ORAL | 0 refills | Status: DC | PRN

## 2018-04-29 ENCOUNTER — Other Ambulatory Visit (HOSPITAL_COMMUNITY): Payer: Self-pay | Admitting: Internal Medicine

## 2018-05-09 ENCOUNTER — Other Ambulatory Visit (HOSPITAL_COMMUNITY): Payer: Self-pay | Admitting: Urology

## 2018-05-09 DIAGNOSIS — N201 Calculus of ureter: Secondary | ICD-10-CM

## 2018-05-16 ENCOUNTER — Ambulatory Visit (HOSPITAL_COMMUNITY)
Admission: RE | Admit: 2018-05-16 | Discharge: 2018-05-16 | Disposition: A | Discharge status: Home / Self Care | Payer: BC Managed Care – PPO | Source: Ambulatory Visit | Attending: Urology | Admitting: Urology

## 2018-05-16 ENCOUNTER — Other Ambulatory Visit: Payer: Self-pay

## 2018-05-16 DIAGNOSIS — N201 Calculus of ureter: Secondary | ICD-10-CM | POA: Insufficient documentation

## 2018-05-24 ENCOUNTER — Ambulatory Visit (INDEPENDENT_AMBULATORY_CARE_PROVIDER_SITE_OTHER): Payer: BC Managed Care – PPO | Admitting: *Deleted

## 2018-05-24 DIAGNOSIS — I429 Cardiomyopathy, unspecified: Secondary | ICD-10-CM | POA: Diagnosis not present

## 2018-05-25 ENCOUNTER — Telehealth: Payer: Self-pay

## 2018-05-25 NOTE — Telephone Encounter (Signed)
Left message for patient to remind of missed remote transmission.  

## 2018-05-26 LAB — CUP PACEART REMOTE DEVICE CHECK
Date Time Interrogation Session: 20200523065744
Implantable Lead Implant Date: 20190816
Implantable Lead Implant Date: 20190816
Implantable Lead Location: 753862
Implantable Lead Location: 753862
Implantable Lead Model: 185
Implantable Lead Serial Number: 348730
Implantable Pulse Generator Implant Date: 20190816
Pulse Gen Serial Number: 9763545

## 2018-05-29 ENCOUNTER — Telehealth: Payer: Self-pay

## 2018-05-29 NOTE — Telephone Encounter (Signed)
I called and left patient a message, he is due for a 6 month follow up. Patient is on Dr. Olin Pia recall list. Dr. Caryl Comes has availability tomorrow.

## 2018-06-01 ENCOUNTER — Encounter: Payer: Self-pay | Admitting: Cardiology

## 2018-06-01 NOTE — Progress Notes (Signed)
Remote pacemaker transmission.   

## 2018-06-08 NOTE — Telephone Encounter (Signed)
Patient called back and made follow up office visit on 08/23/18

## 2018-06-19 ENCOUNTER — Other Ambulatory Visit (HOSPITAL_COMMUNITY): Payer: Self-pay | Admitting: Internal Medicine

## 2018-07-31 ENCOUNTER — Other Ambulatory Visit: Payer: Self-pay | Admitting: Internal Medicine

## 2018-08-03 ENCOUNTER — Other Ambulatory Visit: Payer: Self-pay | Admitting: Internal Medicine

## 2018-08-23 ENCOUNTER — Ambulatory Visit (INDEPENDENT_AMBULATORY_CARE_PROVIDER_SITE_OTHER): Payer: BC Managed Care – PPO | Admitting: *Deleted

## 2018-08-23 DIAGNOSIS — I429 Cardiomyopathy, unspecified: Secondary | ICD-10-CM

## 2018-08-24 ENCOUNTER — Encounter (HOSPITAL_COMMUNITY): Payer: Self-pay | Admitting: Internal Medicine

## 2018-08-24 ENCOUNTER — Ambulatory Visit (HOSPITAL_COMMUNITY)
Admission: RE | Admit: 2018-08-24 | Discharge: 2018-08-24 | Disposition: A | Discharge status: Home / Self Care | Payer: BC Managed Care – PPO | Source: Ambulatory Visit | Attending: Internal Medicine | Admitting: Internal Medicine

## 2018-08-24 ENCOUNTER — Other Ambulatory Visit: Payer: Self-pay

## 2018-08-24 VITALS — BP 138/88 | HR 50 | Wt 218.0 lb

## 2018-08-24 DIAGNOSIS — K219 Gastro-esophageal reflux disease without esophagitis: Secondary | ICD-10-CM | POA: Diagnosis not present

## 2018-08-24 DIAGNOSIS — I5022 Chronic systolic (congestive) heart failure: Secondary | ICD-10-CM | POA: Diagnosis not present

## 2018-08-24 DIAGNOSIS — E785 Hyperlipidemia, unspecified: Secondary | ICD-10-CM | POA: Diagnosis not present

## 2018-08-24 DIAGNOSIS — Z792 Long term (current) use of antibiotics: Secondary | ICD-10-CM | POA: Insufficient documentation

## 2018-08-24 DIAGNOSIS — Z79899 Other long term (current) drug therapy: Secondary | ICD-10-CM | POA: Diagnosis not present

## 2018-08-24 DIAGNOSIS — Z955 Presence of coronary angioplasty implant and graft: Secondary | ICD-10-CM | POA: Insufficient documentation

## 2018-08-24 DIAGNOSIS — R001 Bradycardia, unspecified: Secondary | ICD-10-CM | POA: Diagnosis not present

## 2018-08-24 DIAGNOSIS — Z95810 Presence of automatic (implantable) cardiac defibrillator: Secondary | ICD-10-CM | POA: Insufficient documentation

## 2018-08-24 DIAGNOSIS — I2510 Atherosclerotic heart disease of native coronary artery without angina pectoris: Secondary | ICD-10-CM

## 2018-08-24 DIAGNOSIS — Z85828 Personal history of other malignant neoplasm of skin: Secondary | ICD-10-CM | POA: Diagnosis not present

## 2018-08-24 DIAGNOSIS — Z7902 Long term (current) use of antithrombotics/antiplatelets: Secondary | ICD-10-CM | POA: Insufficient documentation

## 2018-08-24 DIAGNOSIS — I252 Old myocardial infarction: Secondary | ICD-10-CM | POA: Diagnosis not present

## 2018-08-24 DIAGNOSIS — I130 Hypertensive heart and chronic kidney disease with heart failure and stage 1 through stage 4 chronic kidney disease, or unspecified chronic kidney disease: Secondary | ICD-10-CM | POA: Insufficient documentation

## 2018-08-24 DIAGNOSIS — Z7982 Long term (current) use of aspirin: Secondary | ICD-10-CM | POA: Diagnosis not present

## 2018-08-24 DIAGNOSIS — Z87442 Personal history of urinary calculi: Secondary | ICD-10-CM | POA: Insufficient documentation

## 2018-08-24 MED ORDER — FARXIGA 10 MG PO TABS: 10.0000 mg | ORAL_TABLET | Freq: Every day | ORAL | 11 refills | Status: DC

## 2018-08-24 NOTE — Patient Instructions (Signed)
START Farxiga 10 mg, one tab daily   Your physician wants you to follow-up in: 12 months with Dr. Haroldine Laws. You will receive a call to schedule this appointment. If you don't receive a call, please call our office to schedule the follow-up appointment.   Do the following things EVERYDAY: 1) Weigh yourself in the morning before breakfast. Write it down and keep it in a log. 2) Take your medicines as prescribed 3) Eat low salt foods-Limit salt (sodium) to 2000 mg per day.  4) Stay as active as you can everyday 5) Limit all fluids for the day to less than 2 liters   At the [facility], you and your health needs are our priority. As part of our continuing mission to provide you with exceptional heart care, we have created designated Provider Care Teams. These Care Teams include your primary Cardiologist (physician) and Advanced Practice Providers (APPs- Physician Assistants and Nurse Practitioners) who all work together to provide you with the care you need, when you need it.   You may see any of the following providers on your designated Care Team at your next follow up: Marland Kitchen Dr Glori Bickers . Dr Loralie Champagne . Darrick Grinder, NP   Please be sure to bring in all your medications bottles to every appointment.

## 2018-08-24 NOTE — Progress Notes (Signed)
ADVANCED HF CLINIC NOTE  Patient ID: Derrick Lane, male   DOB: 1960-08-30, 58 y.o.   MRN: [id number]  EP: Dr Caryl Comes  HPI:  Derrick Lane is a 58 y/o with h/o CAD s/p anterior MI in May 2011 treated with DES, LHC 08/03/2010: LAD 40% stent patent LCX 30-40% prox OM-1 stent patent RCA 40% EF 15%. S/P  ICD placement - St. Jude dual chamber ICD on Aug 05, 2010, and  Chronic systolic heart failure EF 35-40% 3/17  He not on Ace-I due to angioedema . He is not Spironolactone due to breast tenderness.  Does not tolerate hydralazine due to hypotension. (retired state trooper)  [name] 03/30/10 RA of 4, RV 33/1 (3), PA 40/17 (25), PCWP 12.  CO ([place]) 4.83 CI 2.42   CPX 07/08/10: pVO2 25.6 (74%) slope 34 ME/MVV 67% RER 1.17 Normal HR and BP response.   LHC 08/03/2010: LAD 40% stent patent LCX 30-40% prox OM-1 stent patent RCA 40% EF 15%.  06/13/11 ECHO 30% 11/22/12 ECHO 35-40%, RV good 3/17 EF 35-40%  ICD gen change 8/19   He returns for yearly follow up. Since we last saw him he had a right kidney stone that had to be removed. Now doing well. Remains active. 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 Denies CP or SOB. No edema. No ICD firing.    ROS: All systems negative except as listed in HPI, PMH and Problem List.  Past Medical History:  Diagnosis Date  . AICD (automatic cardioverter/defibrillator) present    [employer] and defibrillator  . BCC (basal cell carcinoma of skin) 06/07/2006   Left Ear  . BCC (basal cell carcinoma of skin) Ulcerated 10/17/2012   Right Cheek Lateral  . CAD (coronary artery disease)    s/p Anterior MI 5/11. LAD stent  . Cancer ([place])    skin cancer  . Chronic kidney disease, stage III (moderate) (HCC)   . Chronic systolic heart failure (HCC)    EF 10-15%.   . Dual implantable cardiac defibrillator    [employer]. Jude-Analyze ST study  . GERD (gastroesophageal reflux disease)   . History of kidney stones   . HLD (hyperlipidemia)   . Myocardial infarction ([place]) 05/03/2009   had  "widow-maker".  . Nodular basal cell carcinoma (BCC) 10/16/2013   Right Neck  . Nodular basal cell carcinoma (BCC) 03/25/2015   Center Forehead  . Nodular basal cell carcinoma (BCC) 08/16/2016   Left Preauricular, and Right Neck  . Nodulo-ulcerative basal cell carcinoma (BCC) 08/16/2016   Right Scalp  . Presence of permanent cardiac pacemaker   . SCC (squamous cell carcinoma) Well Diff 03/07/2018   Crown Scalp, and Right Cheek  . 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 Squamous cell carcinoma in situ (SCCIS) 03/25/2015   Right Cheek  . Superficial basal cell carcinoma (BCC) 10/17/2012   Left Upper Back    Current Outpatient Medications  Medication Sig Dispense Refill  . acetaminophen (TYLENOL) 500 MG tablet Take 1,000 mg by mouth every 6 (six) hours as needed for moderate pain or headache.     Marland Kitchen amoxicillin (AMOXIL) 500 MG capsule Take 2,000 mg by mouth See admin instructions. Take 4 capsules (2000 mg) by mouth 1 hour prior to dental procedures.  99  . aspirin 81 MG tablet Take 81 mg by mouth at bedtime.     . candesartan (ATACAND) 8 MG tablet  TAKE 2 TABLETS (16 MG TOTAL) BY MOUTH 2 (TWO) TIMES DAILY. 360 tablet 1  . carvedilol (COREG) 12.5 MG tablet TAKE 1 TABLET (12.5 MG TOTAL) BY MOUTH 2 (TWO) TIMES DAILY. 180 tablet 2  . clopidogrel (PLAVIX) 75 MG tablet TAKE 1 TABLET BY MOUTH ONCE DAILY 90 tablet 3  . eplerenone (INSPRA) 25 MG tablet TAKE 1 TABLET BY MOUTH EVERY DAY 90 tablet 1  . famotidine (PEPCID) 20 MG tablet Take 20 mg by mouth every evening.    . fexofenadine (ALLEGRA) 180 MG tablet Take 180 mg by mouth at bedtime.    . furosemide (LASIX) 20 MG tablet TAKE 1 TABLET BY MOUTH EVERY DAY IN THE MORNING 90 tablet 1  . ondansetron (ZOFRAN-ODT) 4 MG disintegrating tablet Take 4 mg by mouth every 8 (eight) hours as needed for nausea or vomiting.    . predniSONE (DELTASONE) 20 MG  tablet Take 20 mg by mouth daily as needed (for beef and pork allergies).   1  . rosuvastatin (CRESTOR) 10 MG tablet Take 10 mg by mouth every evening.   3  . sildenafil (REVATIO) 20 MG tablet TAKE 1 TABLET (20 MG TOTAL) BY MOUTH DAILY AS NEEDED (FOR ED). 10 tablet 0   No current facility-administered medications for this encounter.      Vitals:   08/24/18 1228  Weight: 98.9 kg (218 lb)   Vitals:   08/24/18 1228  BP: 138/88  Pulse: (!) 50  SpO2: 97%    PHYSICAL EXAM: General:  Well appearing. No resp difficulty HEENT: normal Neck: supple. no JVD. Carotids 2+ bilat; no bruits. No lymphadenopathy or thryomegaly appreciated. Cor: PMI nondisplaced. Regular rate & rhythm. No rubs, gallops or murmurs. Lungs: clear Abdomen: soft, nontender, nondistended. No hepatosplenomegaly. No bruits or masses. Good bowel sounds. Extremities: no cyanosis, clubbing, rash, edema Neuro: alert & orientedx3, cranial nerves grossly intact. moves all 4 extremities w/o difficulty. Affect pleasant  ASSESSMENT & PLAN:  1) CAD:  - s/p anterior MI in May 2011 treated with DES - LHC 08/03/2010: LAD 40% stent patent LCX 30-40% prox OM-1 stent patent RCA 40% doing well.  - no s/s ischemia - continue ASA, statin, plavix and BB - Lipids followed by Dr. Nadara Mustard. Goal LDL < 70  2) Chronic systolic HF: ICM, EF 123456 (11/2012) s/p ICD Echo 3/17 EF 35-40% - Echo 8/19 EF 25-30% - Doing well. Stable NYHA I-early II symptoms.  - Volume status ok - Continue carvedilol at 12.5 twice a day. HR too low to adjust.  - Candesartan 16 mg BID at goal and eplerenone 25 mg goal. Not candidate for Entresto due to angioedema with ACE-I - Follows with Dr. Caryl Comes for ICD. Had gen change in 8/19 - Start Farxiga 10  3. HTN - BP has always been low. Up slightly today, Will have him follow at home  Glori Bickers MD  12:29 PM

## 2018-08-24 NOTE — Addendum Note (Signed)
Encounter addended by: Kerry Dory, CMA on: 08/24/2018 1:03 PM  Actions taken: Order list changed, Clinical Note Signed

## 2018-08-26 LAB — CUP PACEART REMOTE DEVICE CHECK
Battery Remaining Longevity: 89 mo
Battery Remaining Percentage: 84 %
Battery Voltage: 3.02 V
Brady Statistic AP VP Percent: 1 %
Brady Statistic AP VS Percent: 1.2 %
Brady Statistic AS VP Percent: 1 %
Brady Statistic AS VS Percent: 99 %
Brady Statistic RA Percent Paced: 1.1 %
Brady Statistic RV Percent Paced: 1 %
Date Time Interrogation Session: 20200821141536
HighPow Impedance: 48 Ohm
HighPow Impedance: 49 Ohm
Implantable Lead Implant Date: 20190816
Implantable Lead Implant Date: 20190816
Implantable Lead Location: 753862
Implantable Lead Location: 753862
Implantable Lead Model: 185
Implantable Lead Serial Number: 348730
Implantable Pulse Generator Implant Date: 20190816
Lead Channel Impedance Value: 350 Ohm
Lead Channel Impedance Value: 440 Ohm
Lead Channel Pacing Threshold Amplitude: 0.75 V
Lead Channel Pacing Threshold Amplitude: 1.25 V
Lead Channel Pacing Threshold Pulse Width: 0.5 ms
Lead Channel Pacing Threshold Pulse Width: 0.5 ms
Lead Channel Sensing Intrinsic Amplitude: 11.8 mV
Lead Channel Sensing Intrinsic Amplitude: 4.9 mV
Lead Channel Setting Pacing Amplitude: 2 V
Lead Channel Setting Pacing Amplitude: 2.5 V
Lead Channel Setting Pacing Pulse Width: 0.5 ms
Lead Channel Setting Sensing Sensitivity: 0.5 mV
Pulse Gen Serial Number: 9763545

## 2018-08-30 ENCOUNTER — Other Ambulatory Visit: Payer: Self-pay | Admitting: Dermatology

## 2018-08-30 NOTE — Progress Notes (Signed)
Remote ICD transmission.   

## 2018-09-21 ENCOUNTER — Other Ambulatory Visit (HOSPITAL_COMMUNITY): Payer: Self-pay | Admitting: Cardiology

## 2018-10-12 ENCOUNTER — Other Ambulatory Visit: Payer: Self-pay

## 2018-10-12 MED ORDER — SILDENAFIL CITRATE 20 MG PO TABS: 20.0000 mg | ORAL_TABLET | Freq: Every day | ORAL | 0 refills | Status: DC | PRN

## 2018-11-11 ENCOUNTER — Other Ambulatory Visit: Payer: Self-pay | Admitting: Internal Medicine

## 2018-11-22 ENCOUNTER — Ambulatory Visit (INDEPENDENT_AMBULATORY_CARE_PROVIDER_SITE_OTHER): Payer: BC Managed Care – PPO | Admitting: *Deleted

## 2018-11-22 DIAGNOSIS — I5022 Chronic systolic (congestive) heart failure: Secondary | ICD-10-CM

## 2018-11-22 DIAGNOSIS — I429 Cardiomyopathy, unspecified: Secondary | ICD-10-CM

## 2018-11-23 LAB — CUP PACEART REMOTE DEVICE CHECK
Battery Remaining Longevity: 88 mo
Battery Remaining Percentage: 83 %
Battery Voltage: 3.01 V
Brady Statistic AP VP Percent: 1 %
Brady Statistic AP VS Percent: 1 %
Brady Statistic AS VP Percent: 1 %
Brady Statistic AS VS Percent: 99 %
Brady Statistic RA Percent Paced: 1 %
Brady Statistic RV Percent Paced: 1 %
Date Time Interrogation Session: 20201120081506
HighPow Impedance: 48 Ohm
HighPow Impedance: 48 Ohm
Implantable Lead Implant Date: 20190816
Implantable Lead Implant Date: 20190816
Implantable Lead Location: 753862
Implantable Lead Location: 753862
Implantable Lead Model: 185
Implantable Lead Serial Number: 348730
Implantable Pulse Generator Implant Date: 20190816
Lead Channel Impedance Value: 350 Ohm
Lead Channel Impedance Value: 410 Ohm
Lead Channel Pacing Threshold Amplitude: 0.75 V
Lead Channel Pacing Threshold Amplitude: 1.25 V
Lead Channel Pacing Threshold Pulse Width: 0.5 ms
Lead Channel Pacing Threshold Pulse Width: 0.5 ms
Lead Channel Sensing Intrinsic Amplitude: 11.8 mV
Lead Channel Sensing Intrinsic Amplitude: 4.9 mV
Lead Channel Setting Pacing Amplitude: 2 V
Lead Channel Setting Pacing Amplitude: 2.5 V
Lead Channel Setting Pacing Pulse Width: 0.5 ms
Lead Channel Setting Sensing Sensitivity: 0.5 mV
Pulse Gen Serial Number: 9763545

## 2018-11-27 ENCOUNTER — Ambulatory Visit (INDEPENDENT_AMBULATORY_CARE_PROVIDER_SITE_OTHER): Payer: BC Managed Care – PPO | Admitting: General Surgery

## 2018-11-27 ENCOUNTER — Encounter: Payer: Self-pay | Admitting: General Surgery

## 2018-11-27 ENCOUNTER — Ambulatory Visit: Payer: BC Managed Care – PPO | Admitting: General Surgery

## 2018-11-27 ENCOUNTER — Other Ambulatory Visit: Payer: Self-pay

## 2018-11-27 VITALS — BP 103/66 | HR 61 | Temp 98.0°F | Resp 16 | Ht 72.0 in | Wt 222.0 lb

## 2018-11-27 DIAGNOSIS — K8020 Calculus of gallbladder without cholecystitis without obstruction: Secondary | ICD-10-CM

## 2018-11-27 NOTE — H&P (Signed)
[facility] History and Physical  Reason for Referral: Gallstones  Referring Physician: Caryl Bis, MD      Chief Complaint    Cholelithiasis      Derrick Lane is a 58 y.o. male.  HPI: Derrick Lane is a 58 yo who reports recent issues with epigastric/ RUQ pain that starts at the stomach and radiates around his lower rib cage on this upper right abdomen into his back. He had this in October while at [facility] and having eaten a sandwich with Aioli. 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 He recently had his Alpha gal treated with acupuncture, and says that the symptoms of rash and breathing issues have resolved, but he still has some issues with upper abdominal pain and nausea. He thinks maybe his gallbladder has been causing some of the problems all along.    He reports since October he had 1 additional attack that lasted only 30 minutes and not the 4 hours of his first attack.  He took a Pecid and this helped some of his symptoms. He has a history of CHF with an EF of 30-40% and prior stent placement in 2011 on Plavix.  He also has a defibrillator / ICD in place.  He saw his Cardiologist Dr. Haroldine Laws 08/2018.  He reports no new chest pain, SOB, or swelling. He has been on his normal dose of furosemide and takes an additional dose occasionally.  He has undergone procedures at [facility] including Cystoscopy for kidney stones in the past year.        Past Medical History:  Diagnosis Date  . AICD (automatic cardioverter/defibrillator) present    [employer] and defibrillator  . BCC (basal cell carcinoma of skin) 06/07/2006   Left Ear  . BCC (basal cell carcinoma of skin) Ulcerated 10/17/2012   Right Cheek Lateral  . CAD (coronary artery disease)    s/p Anterior MI 5/11. LAD stent  . Cancer ([place])     skin cancer  . Chronic kidney disease, stage III (moderate)   . Chronic systolic heart failure (HCC)    EF 10-15%.   . Dual implantable cardiac defibrillator    St. Jude-Analyze ST study  . GERD (gastroesophageal reflux disease)   . History of kidney stones   . HLD (hyperlipidemia)   . Myocardial infarction ([place]) 05/03/2009   had "widow-maker".  . Nodular basal cell carcinoma (BCC) 10/16/2013   Right Neck  . Nodular basal cell carcinoma (BCC) 03/25/2015   Center Forehead  . Nodular basal cell carcinoma (BCC) 08/16/2016   Left Preauricular, and Right Neck  . Nodulo-ulcerative basal cell carcinoma (BCC) 08/16/2016   Right Scalp  . Presence of permanent cardiac pacemaker   . SCC (squamous cell carcinoma) Well Diff 03/07/2018   Crown Scalp, and Right Cheek  . 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 Squamous cell carcinoma in situ (SCCIS) 03/25/2015   Right Cheek  . Superficial basal cell carcinoma (BCC) 10/17/2012   Left Upper Back         Past Surgical History:  Procedure Laterality Date  . CARDIAC CATHETERIZATION     stents inserted X3  . CYSTOSCOPY WITH RETROGRADE PYELOGRAM, URETEROSCOPY AND STENT PLACEMENT  Right 06/07/2017   Procedure: CYSTOSCOPY WITH RETROGRADE PYELOGRAM, URETEROSCOPY AND STENT PLACEMENT HOLMIUM LASER;  Surgeon: Cleon Gustin, MD;  Location: [facility];  Service: Urology;  Laterality: Right;  . HERNIA REPAIR Bilateral    Inguinal  . ICD GENERATOR CHANGEOUT N/A 08/18/2017   Procedure: ICD GENERATOR CHANGEOUT;  Surgeon: Deboraha Sprang, MD;  Location: [place] [facility];  Service: Cardiovascular;  Laterality: N/A;  . INSERT / REPLACE / REMOVE PACEMAKER     ICD         Family History  Problem Relation Age of Onset  . Heart attack Mother   . Hyperlipidemia Mother   . Hypertension Mother   . Heart failure Father    . Heart attack Father   . Hypertension Father   . Coronary artery disease Other     Social History       Tobacco Use  . Smoking status: Never Smoker  . Smokeless tobacco: Never Used  Substance Use Topics  . Alcohol use: No  . Drug use: No    Medications: I have reviewed the patient's current medications.      Allergies as of 11/27/2018      Reactions   Beef-derived Products Hives, Swelling   Due to alpha gal syndrome (all mammals)   Peach Flavor Other (See Comments)   Determined via allergy testing.   Pork-derived Products Hives, Swelling   Due to alpha gal syndrome (all mammals)   Ramipril Swelling   Possible lip swelling but may have been attributed to other allergy   Spironolactone Other (See Comments)   swelling of the breasts (gynecomastia) and breast pain         Medication List       Accurate as of November 27, 2018 10:34 AM. If you have any questions, ask your nurse or doctor.        acetaminophen 500 MG tablet Commonly known as: TYLENOL Take 1,000 mg by mouth every 6 (six) hours as needed for moderate pain or headache.   amoxicillin 500 MG capsule Commonly known as: AMOXIL Take 2,000 mg by mouth See admin instructions. Take 4 capsules (2000 mg) by mouth 1 hour prior to dental procedures.   aspirin 81 MG tablet Take 81 mg by mouth at bedtime.   candesartan 8 MG tablet Commonly known as: ATACAND TAKE 2 TABLETS (16 MG TOTAL) BY MOUTH 2 (TWO) TIMES DAILY.   carvedilol 12.5 MG tablet Commonly known as: COREG TAKE 1 TABLET (12.5 MG TOTAL) BY MOUTH 2 (TWO) TIMES DAILY.   clopidogrel 75 MG tablet Commonly known as: PLAVIX TAKE 1 TABLET BY MOUTH ONCE DAILY   eplerenone 25 MG tablet Commonly known as: INSPRA TAKE 1 TABLET BY MOUTH EVERY DAY   famotidine 20 MG tablet Commonly known as: PEPCID Take 20 mg by mouth every evening.   Farxiga 10 MG Tabs tablet Generic drug: dapagliflozin propanediol Take 10 mg by  mouth daily before breakfast.   fexofenadine 180 MG tablet Commonly known as: ALLEGRA Take 180 mg by mouth at bedtime.   furosemide 20 MG tablet Commonly known as: LASIX TAKE 1 TABLET BY MOUTH EVERY DAY IN THE MORNING   ondansetron 4 MG disintegrating tablet Commonly known as: ZOFRAN-ODT Take 4 mg by mouth every 8 (eight) hours as needed for nausea or vomiting.   predniSONE 20 MG tablet Commonly known as: DELTASONE Take 20 mg by mouth daily as needed (for beef and pork allergies).   rosuvastatin 10 MG tablet Commonly known  as: CRESTOR Take 10 mg by mouth every evening.   sildenafil 20 MG tablet Commonly known as: REVATIO TAKE 1 TABLET (20 MG TOTAL) BY MOUTH DAILY AS NEEDED (FOR ED).        ROS:  A comprehensive review of systems was negative except for: Gastrointestinal: positive for abdominal pain, nausea, reflux symptoms and vomiting Allergic/Immunologic: positive for hay fever  Blood pressure 103/66, pulse 61, temperature 98 F (36.7 C), temperature source Oral, resp. rate 16, height 6' (1.829 m), weight 222 lb (100.7 kg), SpO2 96 %. Physical Exam Vitals signs reviewed.  Constitutional:      Appearance: Normal appearance.  HENT:     Head: Normocephalic and atraumatic.     Nose: Nose normal.     Mouth/Throat:     Mouth: Mucous membranes are moist.  Eyes:     Extraocular Movements: Extraocular movements intact.  Neck:     Musculoskeletal: Normal range of motion. No neck rigidity.  Cardiovascular:     Rate and Rhythm: Normal rate and regular rhythm.  Pulmonary:     Effort: Pulmonary effort is normal.     Breath sounds: Normal breath sounds.  Abdominal:     General: There is no distension.     Palpations: Abdomen is soft.     Tenderness: There is no abdominal tenderness.  Skin:    General: Skin is warm and dry.  Neurological:     General: No focal deficit present.     Mental Status: He is alert and oriented to person, place, and time.   Psychiatric:        Mood and Affect: Mood normal.        Behavior: Behavior normal.        Thought Content: Thought content normal.        Judgment: Judgment normal.     Results:           Assessment & Plan:  Derrick Lane is a 58 y.o. male with symptomatic cholelithiasis. He continues to have pain and intermittent episodes with nausea/vomiting. He has US findings consistent with stones and cystic duct stone possibly.  He has no evidence of any CBD stone.  He wants to get his gallbladder removed. He has a history of CAD, CHF and had stent placed in 2011 on Plavix and history of ICD/defibrillator. He is stable from his cardiac symptoms and saw his cardiologist 08/2018.  I have sent Dr. Haroldine Laws a message regarding the patient and surgery to ensure he is ok with this plan and does not want any additional testing.   -PLAN: I counseled the patient about the indication, risks and benefits of laparoscopic cholecystectomy.  He understands there is a very small chance for bleeding, infection, injury to normal structures (including common bile duct), conversion to open surgery, persistent symptoms, evolution of postcholecystectomy diarrhea, need for secondary interventions, anesthesia reaction, cardiopulmonary issues and other risks not specifically detailed here. I described the expected recovery, the plan for follow-up and the restrictions during the recovery phase.  All questions were answered.  -Discussed preoperative COVID testing and isolation -Discussed added risk for patient for cardiac complications due to his history -Will follow up with Dr. Clayborne Dana recommendations -Laparoscopic cholecystectomy on 12/16 will have the patient hold Plavix starting 12/11 ( 5 days prior)   All questions were answered to the satisfaction of the patient.   Virl Cagey 11/27/2018, 10:34 AM

## 2018-11-27 NOTE — Progress Notes (Signed)
[facility] History and Physical  Reason for Referral: Gallstones  Referring Physician: Caryl Bis, MD   Chief Complaint    Cholelithiasis      Derrick Lane is a 58 y.o. male.  HPI: Derrick Lane is a 58 yo who reports recent issues with epigastric/ RUQ pain that starts at the stomach and radiates around his lower rib cage on this upper right abdomen into his back. He had this in October while at [facility] and having eaten a sandwich with Aioli. 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 He recently had his Alpha gal treated with acupuncture, and says that the symptoms of rash and breathing issues have resolved, but he still has some issues with upper abdominal pain and nausea. He thinks maybe his gallbladder has been causing some of the problems all along.    He reports since October he had 1 additional attack that lasted only 30 minutes and not the 4 hours of his first attack.  He took a Pecid and this helped some of his symptoms. He has a history of CHF with an EF of 30-40% and prior stent placement in 2011 on Plavix.  He also has a defibrillator / ICD in place.  He saw his Cardiologist Dr. Haroldine Laws 08/2018.  He reports no new chest pain, SOB, or swelling. He has been on his normal dose of furosemide and takes an additional dose occasionally.  He has undergone procedures at [facility] including Cystoscopy for kidney stones in the past year.    Past Medical History:  Diagnosis Date  . AICD (automatic cardioverter/defibrillator) present    [employer] and defibrillator  . BCC (basal cell carcinoma of skin) 06/07/2006   Left Ear  . BCC (basal cell carcinoma of skin) Ulcerated 10/17/2012   Right Cheek Lateral  . CAD (coronary artery disease)    s/p Anterior MI 5/11. LAD stent  . Cancer ([place])    skin cancer  .  Chronic kidney disease, stage III (moderate)   . Chronic systolic heart failure (HCC)    EF 10-15%.   . Dual implantable cardiac defibrillator    St. Jude-Analyze ST study  . GERD (gastroesophageal reflux disease)   . History of kidney stones   . HLD (hyperlipidemia)   . Myocardial infarction ([place]) 05/03/2009   had "widow-maker".  . Nodular basal cell carcinoma (BCC) 10/16/2013   Right Neck  . Nodular basal cell carcinoma (BCC) 03/25/2015   Center Forehead  . Nodular basal cell carcinoma (BCC) 08/16/2016   Left Preauricular, and Right Neck  . Nodulo-ulcerative basal cell carcinoma (BCC) 08/16/2016   Right Scalp  . Presence of permanent cardiac pacemaker   . SCC (squamous cell carcinoma) Well Diff 03/07/2018   Crown Scalp, and Right Cheek  . 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 Squamous cell carcinoma in situ (SCCIS) 03/25/2015   Right Cheek  . Superficial basal cell carcinoma (BCC) 10/17/2012   Left Upper Back    Past Surgical History:  Procedure Laterality Date  . CARDIAC CATHETERIZATION     stents inserted X3  . CYSTOSCOPY WITH RETROGRADE PYELOGRAM, URETEROSCOPY AND STENT PLACEMENT Right 06/07/2017   Procedure: CYSTOSCOPY WITH RETROGRADE PYELOGRAM, URETEROSCOPY AND STENT  PLACEMENT HOLMIUM LASER;  Surgeon: Cleon Gustin, MD;  Location: [facility];  Service: Urology;  Laterality: Right;  . HERNIA REPAIR Bilateral    Inguinal  . ICD GENERATOR CHANGEOUT N/A 08/18/2017   Procedure: ICD GENERATOR CHANGEOUT;  Surgeon: Deboraha Sprang, MD;  Location: [place] [facility];  Service: Cardiovascular;  Laterality: N/A;  . INSERT / REPLACE / REMOVE PACEMAKER     ICD    Family History  Problem Relation Age of Onset  . Heart attack Mother   . Hyperlipidemia Mother   . Hypertension Mother   . Heart failure Father   . Heart attack Father   . Hypertension Father   . Coronary artery  disease Other     Social History   Tobacco Use  . Smoking status: Never Smoker  . Smokeless tobacco: Never Used  Substance Use Topics  . Alcohol use: No  . Drug use: No    Medications: I have reviewed the patient's current medications. Allergies as of 11/27/2018      Reactions   Beef-derived Products Hives, Swelling   Due to alpha gal syndrome (all mammals)   Peach Flavor Other (See Comments)   Determined via allergy testing.   Pork-derived Products Hives, Swelling   Due to alpha gal syndrome (all mammals)   Ramipril Swelling   Possible lip swelling but may have been attributed to other allergy   Spironolactone Other (See Comments)   swelling of the breasts (gynecomastia) and breast pain      Medication List       Accurate as of November 27, 2018 10:34 AM. If you have any questions, ask your nurse or doctor.        acetaminophen 500 MG tablet Commonly known as: TYLENOL Take 1,000 mg by mouth every 6 (six) hours as needed for moderate pain or headache.   amoxicillin 500 MG capsule Commonly known as: AMOXIL Take 2,000 mg by mouth See admin instructions. Take 4 capsules (2000 mg) by mouth 1 hour prior to dental procedures.   aspirin 81 MG tablet Take 81 mg by mouth at bedtime.   candesartan 8 MG tablet Commonly known as: ATACAND TAKE 2 TABLETS (16 MG TOTAL) BY MOUTH 2 (TWO) TIMES DAILY.   carvedilol 12.5 MG tablet Commonly known as: COREG TAKE 1 TABLET (12.5 MG TOTAL) BY MOUTH 2 (TWO) TIMES DAILY.   clopidogrel 75 MG tablet Commonly known as: PLAVIX TAKE 1 TABLET BY MOUTH ONCE DAILY   eplerenone 25 MG tablet Commonly known as: INSPRA TAKE 1 TABLET BY MOUTH EVERY DAY   famotidine 20 MG tablet Commonly known as: PEPCID Take 20 mg by mouth every evening.   Farxiga 10 MG Tabs tablet Generic drug: dapagliflozin propanediol Take 10 mg by mouth daily before breakfast.   fexofenadine 180 MG tablet Commonly known as: ALLEGRA Take 180 mg by mouth at bedtime.    furosemide 20 MG tablet Commonly known as: LASIX TAKE 1 TABLET BY MOUTH EVERY DAY IN THE MORNING   ondansetron 4 MG disintegrating tablet Commonly known as: ZOFRAN-ODT Take 4 mg by mouth every 8 (eight) hours as needed for nausea or vomiting.   predniSONE 20 MG tablet Commonly known as: DELTASONE Take 20 mg by mouth daily as needed (for beef and pork allergies).   rosuvastatin 10 MG tablet Commonly known as: CRESTOR Take 10 mg by mouth every evening.   sildenafil 20 MG tablet Commonly known as: REVATIO TAKE 1 TABLET (20 MG TOTAL) BY MOUTH DAILY AS  NEEDED (FOR ED).        ROS:  A comprehensive review of systems was negative except for: Gastrointestinal: positive for abdominal pain, nausea, reflux symptoms and vomiting Allergic/Immunologic: positive for hay fever  Blood pressure 103/66, pulse 61, temperature 98 F (36.7 C), temperature source Oral, resp. rate 16, height 6' (1.829 m), weight 222 lb (100.7 kg), SpO2 96 %. Physical Exam Vitals signs reviewed.  Constitutional:      Appearance: Normal appearance.  HENT:     Head: Normocephalic and atraumatic.     Nose: Nose normal.     Mouth/Throat:     Mouth: Mucous membranes are moist.  Eyes:     Extraocular Movements: Extraocular movements intact.  Neck:     Musculoskeletal: Normal range of motion. No neck rigidity.  Cardiovascular:     Rate and Rhythm: Normal rate and regular rhythm.  Pulmonary:     Effort: Pulmonary effort is normal.     Breath sounds: Normal breath sounds.  Abdominal:     General: There is no distension.     Palpations: Abdomen is soft.     Tenderness: There is no abdominal tenderness.  Skin:    General: Skin is warm and dry.  Neurological:     General: No focal deficit present.     Mental Status: He is alert and oriented to person, place, and time.  Psychiatric:        Mood and Affect: Mood normal.        Behavior: Behavior normal.        Thought Content: Thought content normal.         Judgment: Judgment normal.     Results:           Assessment & Plan:  Derrick Lane is a 58 y.o. male with symptomatic cholelithiasis. He continues to have pain and intermittent episodes with nausea/vomiting. He has US findings consistent with stones and cystic duct stone possibly.  He has no evidence of any CBD stone.  He wants to get his gallbladder removed. He has a history of CAD, CHF and had stent placed in 2011 on Plavix and history of ICD/defibrillator. He is stable from his cardiac symptoms and saw his cardiologist 08/2018.  I have sent Dr. Haroldine Laws a message regarding the patient and surgery to ensure he is ok with this plan and does not want any additional testing.   -PLAN: I counseled the patient about the indication, risks and benefits of laparoscopic cholecystectomy.  He understands there is a very small chance for bleeding, infection, injury to normal structures (including common bile duct), conversion to open surgery, persistent symptoms, evolution of postcholecystectomy diarrhea, need for secondary interventions, anesthesia reaction, cardiopulmonary issues and other risks not specifically detailed here. I described the expected recovery, the plan for follow-up and the restrictions during the recovery phase.  All questions were answered.  -Discussed preoperative COVID testing and isolation -Discussed added risk for patient for cardiac complications due to his history -Will follow up with Dr. Clayborne Dana recommendations -Laparoscopic cholecystectomy on 12/16 will have the patient hold Plavix starting 12/11 ( 5 days prior)   All questions were answered to the satisfaction of the patient.   Virl Cagey 11/27/2018, 10:34 AM

## 2018-11-27 NOTE — Patient Instructions (Signed)
Laparoscopic Cholecystectomy Laparoscopic cholecystectomy is surgery to remove the gallbladder. The gallbladder is a pear-shaped organ that lies beneath the liver on the right side of the body. The gallbladder stores bile, which is a fluid that helps the body to digest fats. Cholecystectomy is often done for inflammation of the gallbladder (cholecystitis). This condition is usually caused by a buildup of gallstones (cholelithiasis) in the gallbladder. Gallstones can block the flow of bile, which can result in inflammation and pain. In severe cases, emergency surgery may be required. This procedure is done though small incisions in your abdomen (laparoscopic surgery). A thin scope with a camera (laparoscope) is inserted through one incision. Thin surgical instruments are inserted through the other incisions. In some cases, a laparoscopic procedure may be turned into a type of surgery that is done through a larger incision (open surgery). Tell a health care provider about:  Any allergies you have.  All medicines you are taking, including vitamins, herbs, eye drops, creams, and over-the-counter medicines.  Any problems you or family members have had with anesthetic medicines.  Any blood disorders you have.  Any surgeries you have had.  Any medical conditions you have.  Whether you are pregnant or may be pregnant. What are the risks? Generally, this is a safe procedure. However, problems may occur, including:  Infection.  Bleeding.  Allergic reactions to medicines.  Damage to other structures or organs.  A stone remaining in the common bile duct. The common bile duct carries bile from the gallbladder into the small intestine.  A bile leak from the cyst duct that is clipped when your gallbladder is removed. Medicines  Ask your health care provider about: ? Changing or stopping your regular medicines. This is especially important if you are taking diabetes medicines or blood thinners. ?  Taking medicines such as aspirin and ibuprofen. These medicines can thin your blood. Do not take these medicines before your procedure if your health care provider instructs you not to.  You may be given antibiotic medicine to help prevent infection. General instructions  Let your health care provider know if you develop a cold or an infection before surgery.  Plan to have someone take you home from the hospital or clinic.  Ask your health care provider how your surgical site will be marked or identified. What happens during the procedure?   To reduce your risk of infection: ? Your health care team will wash or sanitize their hands. ? Your skin will be washed with soap. ? Hair may be removed from the surgical area.  An IV tube may be inserted into one of your veins.  You will be given one or more of the following: ? A medicine to help you relax (sedative). ? A medicine to make you fall asleep (general anesthetic).  A breathing tube will be placed in your mouth.  Your surgeon will make several small cuts (incisions) in your abdomen.  The laparoscope will be inserted through one of the small incisions. The camera on the laparoscope will send images to a TV screen (monitor) in the operating room. This lets your surgeon see inside your abdomen.  Air-like gas will be pumped into your abdomen. This will expand your abdomen to give the surgeon more room to perform the surgery.  Other tools that are needed for the procedure will be inserted through the other incisions. The gallbladder will be removed through one of the incisions.  Your common bile duct may be examined. If stones are found   in the common bile duct, they may be removed.  After your gallbladder has been removed, the incisions will be closed with stitches (sutures), staples, or skin glue.  Your incisions may be covered with a bandage (dressing). The procedure may vary among health care providers and hospitals. What happens  after the procedure?  Your blood pressure, heart rate, breathing rate, and blood oxygen level will be monitored until the medicines you were given have worn off.  You will be given medicines as needed to control your pain.  Do not drive for 24 hours if you were given a sedative. This information is not intended to replace advice given to you by your health care provider. Make sure you discuss any questions you have with your health care provider. Document Released: 12/20/2004 Document Revised: 12/02/2016 Document Reviewed: 06/08/2015 Elsevier Patient Education  2020 [employer] Cholelithiasis  Cholelithiasis is also called "gallstones." It is a kind of gallbladder disease. The gallbladder is an organ that stores a liquid (bile) that helps you digest fat. Gallstones may not cause symptoms (may be silent gallstones) until they cause a blockage, and then they can cause pain (gallbladder attack). Follow these instructions at home:  Take over-the-counter and prescription medicines only as told by your doctor.  Stay at a healthy weight.  Eat healthy foods. This includes: ? Eating fewer fatty foods, like fried foods. ? Eating fewer refined carbs (refined carbohydrates). Refined carbs are breads and grains that are highly processed, like white bread and white rice. Instead, choose whole grains like whole-wheat bread and brown rice. ? Eating more fiber. Almonds, fresh fruit, and beans are healthy sources of fiber.  Keep all follow-up visits as told by your doctor. This is important. Contact a doctor if:  You have sudden pain in the upper right side of your belly (abdomen). Pain might spread to your right shoulder or your chest. This may be a sign of a gallbladder attack.  You feel sick to your stomach (are nauseous).  You throw up (vomit).  You have been diagnosed with gallstones that have no symptoms and you get: ? Belly pain. ? Discomfort, burning, or fullness in the upper part of your  belly (indigestion). Get help right away if:  You have sudden pain in the upper right side of your belly, and it lasts for more than 2 hours.  You have belly pain that lasts for more than 5 hours.  You have a fever or chills.  You keep feeling sick to your stomach or you keep throwing up.  Your skin or the whites of your eyes turn yellow (jaundice).  You have dark-colored pee (urine).  You have light-colored poop (stool). Summary  Cholelithiasis is also called "gallstones."  The gallbladder is an organ that stores a liquid (bile) that helps you digest fat.  Silent gallstones are gallstones that do not cause symptoms.  A gallbladder attack may cause sudden pain in the upper right side of your belly. Pain might spread to your right shoulder or your chest. If this happens, contact your doctor.  If you have sudden pain in the upper right side of your belly that lasts for more than 2 hours, get help right away. This information is not intended to replace advice given to you by your health care provider. Make sure you discuss any questions you have with your health care provider. Document Released: 06/08/2007 Document Revised: 12/02/2016 Document Reviewed: 09/06/2015 Elsevier Patient Education  2020 Elsevier Inc.  

## 2018-12-11 ENCOUNTER — Other Ambulatory Visit (HOSPITAL_COMMUNITY): Payer: Self-pay

## 2018-12-11 MED ORDER — SILDENAFIL CITRATE 20 MG PO TABS: 20.0000 mg | ORAL_TABLET | Freq: Every day | ORAL | 0 refills | Status: DC | PRN

## 2018-12-14 NOTE — Pre-Procedure Instructions (Signed)
Doing PAT workup for chart and see that Dr Constance Haw reached out to Dr Haroldine Laws to make sure patient is cardiac ready for procedure. Called Kelly at [facility] and left message for her to send cardiac clearance over if she has one.

## 2018-12-14 NOTE — Pre-Procedure Instructions (Signed)
Defibrillator form sent via Epic to Dr Haroldine Laws concerning surgery.

## 2018-12-17 ENCOUNTER — Encounter (HOSPITAL_COMMUNITY): Payer: Self-pay

## 2018-12-17 ENCOUNTER — Encounter (HOSPITAL_COMMUNITY)
Admission: RE | Admit: 2018-12-17 | Discharge: 2018-12-17 | Disposition: A | Discharge status: Home / Self Care | Payer: BC Managed Care – PPO | Source: Ambulatory Visit | Attending: General Surgery | Admitting: General Surgery

## 2018-12-17 ENCOUNTER — Other Ambulatory Visit (HOSPITAL_COMMUNITY)
Admission: RE | Admit: 2018-12-17 | Discharge: 2018-12-17 | Disposition: A | Discharge status: Home / Self Care | Payer: BC Managed Care – PPO | Source: Ambulatory Visit | Attending: General Surgery | Admitting: General Surgery

## 2018-12-17 ENCOUNTER — Other Ambulatory Visit: Payer: Self-pay

## 2018-12-17 DIAGNOSIS — Z20828 Contact with and (suspected) exposure to other viral communicable diseases: Secondary | ICD-10-CM | POA: Insufficient documentation

## 2018-12-17 DIAGNOSIS — Z01812 Encounter for preprocedural laboratory examination: Secondary | ICD-10-CM | POA: Diagnosis not present

## 2018-12-17 LAB — SARS CORONAVIRUS 2 (TAT 6-24 HRS): SARS Coronavirus 2: NEGATIVE

## 2018-12-18 ENCOUNTER — Telehealth: Payer: Self-pay | Admitting: General Surgery

## 2018-12-18 NOTE — Telephone Encounter (Signed)
[facility]  Due to inclement weather, I asked the patient if he would want to move his surgery to Thursday. He has opted to do this due to driving and his wife driving.   He knows to continue to isolate after his COVID test. I have also been in contact with his cardiologist, Dr. Haroldine Laws, and he felt the patient was fine to proceed with surgery and needed no further workup. He has stable CHF with EF 30% but is asymptomatic and well controlled.  Curlene Labrum, MD [facility] [address] [place], [place] [address] [phone] (office)

## 2018-12-19 DIAGNOSIS — K8020 Calculus of gallbladder without cholecystitis without obstruction: Secondary | ICD-10-CM | POA: Diagnosis present

## 2018-12-20 ENCOUNTER — Encounter (HOSPITAL_COMMUNITY)
Admission: RE | Disposition: A | Discharge status: Home / Self Care | Payer: Self-pay | Source: Home / Self Care | Attending: General Surgery

## 2018-12-20 ENCOUNTER — Telehealth: Payer: Self-pay | Admitting: General Surgery

## 2018-12-20 ENCOUNTER — Ambulatory Visit (HOSPITAL_COMMUNITY): Payer: BC Managed Care – PPO | Admitting: Anesthesiology

## 2018-12-20 ENCOUNTER — Ambulatory Visit (HOSPITAL_COMMUNITY)
Admission: RE | Admit: 2018-12-20 | Discharge: 2018-12-20 | Disposition: A | Discharge status: Home / Self Care | Payer: BC Managed Care – PPO | Attending: General Surgery | Admitting: General Surgery

## 2018-12-20 ENCOUNTER — Other Ambulatory Visit: Payer: Self-pay

## 2018-12-20 ENCOUNTER — Telehealth: Payer: Self-pay | Admitting: Nurse Practitioner

## 2018-12-20 DIAGNOSIS — Z87442 Personal history of urinary calculi: Secondary | ICD-10-CM | POA: Insufficient documentation

## 2018-12-20 DIAGNOSIS — I5022 Chronic systolic (congestive) heart failure: Secondary | ICD-10-CM | POA: Diagnosis not present

## 2018-12-20 DIAGNOSIS — E785 Hyperlipidemia, unspecified: Secondary | ICD-10-CM | POA: Insufficient documentation

## 2018-12-20 DIAGNOSIS — Z7982 Long term (current) use of aspirin: Secondary | ICD-10-CM | POA: Insufficient documentation

## 2018-12-20 DIAGNOSIS — K219 Gastro-esophageal reflux disease without esophagitis: Secondary | ICD-10-CM | POA: Diagnosis not present

## 2018-12-20 DIAGNOSIS — K8020 Calculus of gallbladder without cholecystitis without obstruction: Secondary | ICD-10-CM | POA: Diagnosis not present

## 2018-12-20 DIAGNOSIS — I2510 Atherosclerotic heart disease of native coronary artery without angina pectoris: Secondary | ICD-10-CM | POA: Insufficient documentation

## 2018-12-20 DIAGNOSIS — K8010 Calculus of gallbladder with chronic cholecystitis without obstruction: Secondary | ICD-10-CM | POA: Insufficient documentation

## 2018-12-20 DIAGNOSIS — I252 Old myocardial infarction: Secondary | ICD-10-CM | POA: Diagnosis not present

## 2018-12-20 DIAGNOSIS — Z7984 Long term (current) use of oral hypoglycemic drugs: Secondary | ICD-10-CM | POA: Diagnosis not present

## 2018-12-20 DIAGNOSIS — Z955 Presence of coronary angioplasty implant and graft: Secondary | ICD-10-CM | POA: Insufficient documentation

## 2018-12-20 DIAGNOSIS — Z95810 Presence of automatic (implantable) cardiac defibrillator: Secondary | ICD-10-CM | POA: Diagnosis not present

## 2018-12-20 DIAGNOSIS — Z85828 Personal history of other malignant neoplasm of skin: Secondary | ICD-10-CM | POA: Diagnosis not present

## 2018-12-20 DIAGNOSIS — Z7902 Long term (current) use of antithrombotics/antiplatelets: Secondary | ICD-10-CM | POA: Insufficient documentation

## 2018-12-20 DIAGNOSIS — Z79899 Other long term (current) drug therapy: Secondary | ICD-10-CM | POA: Diagnosis not present

## 2018-12-20 DIAGNOSIS — Z8249 Family history of ischemic heart disease and other diseases of the circulatory system: Secondary | ICD-10-CM | POA: Insufficient documentation

## 2018-12-20 HISTORY — PX: CHOLECYSTECTOMY: SHX55

## 2018-12-20 SURGERY — LAPAROSCOPIC CHOLECYSTECTOMY
Anesthesia: General | Site: Abdomen

## 2018-12-20 MED ORDER — LACTATED RINGERS IV SOLN: INTRAVENOUS | Status: DC

## 2018-12-20 MED ORDER — ONDANSETRON HCL 4 MG/2ML IJ SOLN
INTRAMUSCULAR | Status: DC | PRN
  Administered 2018-12-20: 4 mg via INTRAVENOUS

## 2018-12-20 MED ORDER — EPHEDRINE SULFATE 50 MG/ML IJ SOLN
INTRAMUSCULAR | Status: DC | PRN
  Administered 2018-12-20: 5 mg via INTRAVENOUS
  Administered 2018-12-20 (×2): 10 mg via INTRAVENOUS
  Administered 2018-12-20: 5 mg via INTRAVENOUS

## 2018-12-20 MED ORDER — SODIUM CHLORIDE 0.9 % IR SOLN
Status: DC | PRN
  Administered 2018-12-20: 1000 mL

## 2018-12-20 MED ORDER — ONDANSETRON HCL 4 MG/2ML IJ SOLN
INTRAMUSCULAR | Status: AC
  Filled 2018-12-20: qty 2

## 2018-12-20 MED ORDER — HEMOSTATIC AGENTS (NO CHARGE) OPTIME
TOPICAL | Status: DC | PRN
  Administered 2018-12-20: 1 via TOPICAL

## 2018-12-20 MED ORDER — ROCURONIUM BROMIDE 10 MG/ML (PF) SYRINGE
PREFILLED_SYRINGE | INTRAVENOUS | Status: AC
  Filled 2018-12-20: qty 10

## 2018-12-20 MED ORDER — FENTANYL CITRATE (PF) 100 MCG/2ML IJ SOLN
INTRAMUSCULAR | Status: DC | PRN
  Administered 2018-12-20: 50 ug via INTRAVENOUS
  Administered 2018-12-20: 100 ug via INTRAVENOUS

## 2018-12-20 MED ORDER — ROCURONIUM BROMIDE 100 MG/10ML IV SOLN
INTRAVENOUS | Status: DC | PRN
  Administered 2018-12-20: 40 mg via INTRAVENOUS
  Administered 2018-12-20: 10 mg via INTRAVENOUS

## 2018-12-20 MED ORDER — PROMETHAZINE HCL 25 MG/ML IJ SOLN: 6.2500 mg | INTRAMUSCULAR | Status: DC | PRN

## 2018-12-20 MED ORDER — BUPIVACAINE HCL (PF) 0.5 % IJ SOLN
INTRAMUSCULAR | Status: DC | PRN
  Administered 2018-12-20: 10 mL

## 2018-12-20 MED ORDER — SUCCINYLCHOLINE CHLORIDE 20 MG/ML IJ SOLN
INTRAMUSCULAR | Status: DC | PRN
  Administered 2018-12-20: 140 mg via INTRAVENOUS

## 2018-12-20 MED ORDER — HYDROCODONE-ACETAMINOPHEN 7.5-325 MG PO TABS
1.0000 | ORAL_TABLET | Freq: Once | ORAL | Status: AC | PRN
  Administered 2018-12-20: 1 via ORAL
  Filled 2018-12-20: qty 1

## 2018-12-20 MED ORDER — LACTATED RINGERS IV SOLN: INTRAVENOUS | Status: DC | PRN

## 2018-12-20 MED ORDER — DEXAMETHASONE SODIUM PHOSPHATE 10 MG/ML IJ SOLN
INTRAMUSCULAR | Status: AC
  Filled 2018-12-20: qty 1

## 2018-12-20 MED ORDER — DEXAMETHASONE SODIUM PHOSPHATE 10 MG/ML IJ SOLN
INTRAMUSCULAR | Status: DC | PRN
  Administered 2018-12-20: 10 mg via INTRAVENOUS

## 2018-12-20 MED ORDER — SODIUM CHLORIDE 0.9 % IV SOLN
2.0000 g | INTRAVENOUS | Status: AC
  Administered 2018-12-20: 11:00:00 2 g via INTRAVENOUS
  Filled 2018-12-20: qty 2

## 2018-12-20 MED ORDER — PROPOFOL 10 MG/ML IV BOLUS
INTRAVENOUS | Status: AC
  Filled 2018-12-20: qty 40

## 2018-12-20 MED ORDER — PROPOFOL 10 MG/ML IV BOLUS
INTRAVENOUS | Status: DC | PRN
  Administered 2018-12-20: 180 mg via INTRAVENOUS

## 2018-12-20 MED ORDER — MIDAZOLAM HCL 2 MG/2ML IJ SOLN: 0.5000 mg | Freq: Once | INTRAMUSCULAR | Status: DC | PRN

## 2018-12-20 MED ORDER — SUGAMMADEX SODIUM 500 MG/5ML IV SOLN
INTRAVENOUS | Status: DC | PRN
  Administered 2018-12-20: 100 mg via INTRAVENOUS
  Administered 2018-12-20: 200 mg via INTRAVENOUS

## 2018-12-20 MED ORDER — HYDROMORPHONE HCL 1 MG/ML IJ SOLN
0.2500 mg | INTRAMUSCULAR | Status: DC | PRN
  Administered 2018-12-20: 0.5 mg via INTRAVENOUS
  Filled 2018-12-20: qty 0.5

## 2018-12-20 MED ORDER — BUPIVACAINE HCL (PF) 0.5 % IJ SOLN
INTRAMUSCULAR | Status: AC
  Filled 2018-12-20: qty 30

## 2018-12-20 MED ORDER — FENTANYL CITRATE (PF) 250 MCG/5ML IJ SOLN
INTRAMUSCULAR | Status: AC
  Filled 2018-12-20: qty 5

## 2018-12-20 MED ORDER — CHLORHEXIDINE GLUCONATE CLOTH 2 % EX PADS: 6.0000 | MEDICATED_PAD | Freq: Once | CUTANEOUS | Status: DC

## 2018-12-20 SURGICAL SUPPLY — 43 items
APPLIER CLIP ROT 10 11.4 M/L (STAPLE) ×2
BAG RETRIEVAL 10 (BASKET) ×1
BLADE SURG 15 STRL LF DISP TIS (BLADE) ×1 IMPLANT
BLADE SURG 15 STRL SS (BLADE) ×1
CHLORAPREP W/TINT 26 (MISCELLANEOUS) ×2 IMPLANT
CLIP APPLIE ROT 10 11.4 M/L (STAPLE) ×1 IMPLANT
CLOTH BEACON ORANGE TIMEOUT ST (SAFETY) ×2 IMPLANT
COVER LIGHT HANDLE STERIS (MISCELLANEOUS) ×4 IMPLANT
COVER WAND RF STERILE (DRAPES) ×2 IMPLANT
DECANTER SPIKE VIAL GLASS SM (MISCELLANEOUS) ×2 IMPLANT
DERMABOND ADVANCED (GAUZE/BANDAGES/DRESSINGS) ×1
DERMABOND ADVANCED .7 DNX12 (GAUZE/BANDAGES/DRESSINGS) ×1 IMPLANT
ELECT REM PT RETURN 9FT ADLT (ELECTROSURGICAL) ×2
ELECTRODE REM PT RTRN 9FT ADLT (ELECTROSURGICAL) ×1 IMPLANT
GLOVE BIO SURGEON STRL SZ 6.5 (GLOVE) ×2 IMPLANT
GLOVE BIO SURGEON STRL SZ7 (GLOVE) ×1 IMPLANT
GLOVE BIOGEL PI IND STRL 6.5 (GLOVE) ×1 IMPLANT
GLOVE BIOGEL PI IND STRL 7.0 (GLOVE) ×3 IMPLANT
GLOVE BIOGEL PI INDICATOR 6.5 (GLOVE) ×1
GLOVE BIOGEL PI INDICATOR 7.0 (GLOVE) ×3
GLOVE ECLIPSE 6.5 STRL STRAW (GLOVE) ×1 IMPLANT
GOWN STRL REUS W/TWL LRG LVL3 (GOWN DISPOSABLE) ×6 IMPLANT
HEMOSTAT SNOW SURGICEL 2X4 (HEMOSTASIS) ×2 IMPLANT
INST SET LAPROSCOPIC AP (KITS) ×2 IMPLANT
KIT TURNOVER KIT A (KITS) ×2 IMPLANT
MANIFOLD NEPTUNE II (INSTRUMENTS) ×2 IMPLANT
NDL INSUFFLATION 14GA 120MM (NEEDLE) ×1 IMPLANT
NEEDLE INSUFFLATION 14GA 120MM (NEEDLE) ×2 IMPLANT
NS IRRIG 1000ML POUR BTL (IV SOLUTION) ×2 IMPLANT
PACK LAP CHOLE LZT030E (CUSTOM PROCEDURE TRAY) ×2 IMPLANT
PAD ARMBOARD 7.5X6 YLW CONV (MISCELLANEOUS) ×2 IMPLANT
SET BASIN LINEN APH (SET/KITS/TRAYS/PACK) ×2 IMPLANT
SET TUBE SMOKE EVAC HIGH FLOW (TUBING) ×2 IMPLANT
SLEEVE ENDOPATH XCEL 5M (ENDOMECHANICALS) ×2 IMPLANT
SUT MNCRL AB 4-0 PS2 18 (SUTURE) ×3 IMPLANT
SUT VICRYL 0 UR6 27IN ABS (SUTURE) ×2 IMPLANT
SYS BAG RETRIEVAL 10MM (BASKET) ×1
SYSTEM BAG RETRIEVAL 10MM (BASKET) ×1 IMPLANT
TROCAR ENDO BLADELESS 11MM (ENDOMECHANICALS) ×2 IMPLANT
TROCAR XCEL NON-BLD 5MMX100MML (ENDOMECHANICALS) ×2 IMPLANT
TROCAR XCEL UNIV SLVE 11M 100M (ENDOMECHANICALS) ×2 IMPLANT
TUBE CONNECTING 12X1/4 (SUCTIONS) ×2 IMPLANT
WARMER LAPAROSCOPE (MISCELLANEOUS) ×2 IMPLANT

## 2018-12-20 NOTE — Telephone Encounter (Signed)
-----   Message from Sunday Shams, RN sent at 12/20/2018 10:41 AM EST ----- Regarding: Perioperative Cardiac Device Programming Planned Procedure:  Lap Cholecystectomy Surgeon:  Curlene Labrum Date of Procedure:  12/17 Cautery will be used. Position during surgery:  supine Please send documentation back to: [facility] (Fax # [phone])  Rennis Harding, RN  12/20/2018 10:42 AM

## 2018-12-20 NOTE — Progress Notes (Signed)
[facility]  Called wife, notified surgery completed. Patient has some narcotics at home and does not need an additional prescription. Post op phone call 01/08/19. Ok to restart plavix 12/18.  Curlene Labrum, MD [facility] [address] [place], [place] [address] [phone] (office)

## 2018-12-20 NOTE — Anesthesia Postprocedure Evaluation (Signed)
Anesthesia Post Note  Patient: Derrick Lane  Procedure(s) Performed: LAPAROSCOPIC CHOLECYSTECTOMY (N/A Abdomen)  Patient location during evaluation: Short Stay Anesthesia Type: General Level of consciousness: awake and alert and patient cooperative Pain management: satisfactory to patient Vital Signs Assessment: post-procedure vital signs reviewed and stable Respiratory status: spontaneous breathing Cardiovascular status: stable Postop Assessment: no apparent nausea or vomiting Anesthetic complications: no Comments: Defibrillator integration completed by representative, [employer]     Last Vitals:  Vitals:   12/20/18 1315 12/20/18 1330  BP: 115/88 111/74  Pulse: 60 (!) 54  Resp: (!) 23 13  Temp:    SpO2: 94% 94%    Last Pain:  Vitals:   12/20/18 1315  TempSrc:   PainSc: 6                  Novalyn Lajara

## 2018-12-20 NOTE — Telephone Encounter (Signed)
Pt with STJ dual chamber ICD.  Normal device function per recent remote.   Rarely paces.  No recent therapies.  Ok to place magnet over device for surgery. No other intervention needed.  Chanetta Marshall, NP 12/20/2018 10:50 AM

## 2018-12-20 NOTE — Anesthesia Procedure Notes (Signed)
Procedure Name: Intubation Date/Time: 12/20/2018 11:03 AM Performed by: Vista Deck, CRNA Pre-anesthesia Checklist: Patient identified, Patient being monitored, Timeout performed, Emergency Drugs available and Suction available Patient Re-evaluated:Patient Re-evaluated prior to induction Oxygen Delivery Method: Circle system utilized Preoxygenation: Pre-oxygenation with 100% oxygen Induction Type: IV induction Grade View: Grade I Tube type: Oral Tube size: 7.0 mm Number of attempts: 1 Airway Equipment and Method: Stylet and Video-laryngoscopy Placement Confirmation: ETT inserted through vocal cords under direct vision,  positive ETCO2 and breath sounds checked- equal and bilateral Secured at: 24 cm Tube secured with: Tape Dental Injury: Teeth and Oropharynx as per pre-operative assessment  Comments: DL with Mac 4. Grade 3 view. Converted to Glidescope Lo pro 4 intubation by Dr Hilaria Ota,

## 2018-12-20 NOTE — Anesthesia Postprocedure Evaluation (Signed)
Anesthesia Post Note  Patient: DIONTE FULLENKAMP  Procedure(s) Performed: LAPAROSCOPIC CHOLECYSTECTOMY (N/A Abdomen)  Patient location during evaluation: PACU Anesthesia Type: General Level of consciousness: awake and alert and patient cooperative Pain management: satisfactory to patient Vital Signs Assessment: post-procedure vital signs reviewed and stable Respiratory status: spontaneous breathing Cardiovascular status: stable Postop Assessment: no apparent nausea or vomiting Anesthetic complications: no     Last Vitals:  Vitals:   12/20/18 1215 12/20/18 1225  BP: 109/70   Pulse: 61 (!) 58  Resp: 12 14  Temp:    SpO2: 95% 94%    Last Pain:  Vitals:   12/20/18 1225  TempSrc:   PainSc: 4                 Tessia Kassin

## 2018-12-20 NOTE — Pre-Procedure Instructions (Signed)
[employer] Representative, [place], notified of urgent need for interrogation of pt's ICD after surgery. Magda Paganini stated that the magnet would be used in surgery per protocol and confirmed that someone would be available after surgery for interrogation at 1230 or 1300.

## 2018-12-20 NOTE — Telephone Encounter (Signed)
-----   Message from Jolaine Artist, MD sent at 11/27/2018  3:49 PM EST ----- Regarding: RE: Laparoscopic cholecystectomy 12/16 Sounds good. He should be fine to proceed.  ----- Message ----- From: Virl Cagey, MD Sent: 11/27/2018  11:02 AM EST To: Jolaine Artist, MD, Margy Clarks, LPN Subject: Laparoscopic cholecystectomy 12/16             Hey,  I know that Mr. Raveling has seen you 08/2018 for his h/o CAD s/o DES 2011 and history of chronic systolic heart failure with EF 30%.  He says he has been stable without any new symptoms or issues.  I wanted to give you an FYI that we are setting him up for surgery pending you thinking he needs any additional workup or repeat testing.    Right now he is on for 12/16, but we can delay this as needed.    Let me know how I can help and I appreciate your time.   Curlene Labrum, MD [facility] [address] [place],  [address] [phone] (office)

## 2018-12-20 NOTE — Interval H&P Note (Signed)
History and Physical Interval Note:  12/20/2018 9:55 AM  Derrick Lane  has presented today for surgery, with the diagnosis of Cholelithiasis.  The various methods of treatment have been discussed with the patient and family. After consideration of risks, benefits and other options for treatment, the patient has consented to  Procedure(s): LAPAROSCOPIC CHOLECYSTECTOMY (N/A) as a surgical intervention.  The patient's history has been reviewed, patient examined, no change in status, stable for surgery.  I have reviewed the patient's chart and labs.  Questions were answered to the patient's satisfaction.    No changes.  Virl Cagey

## 2018-12-20 NOTE — Op Note (Signed)
Operative Note   Preoperative Diagnosis: Symptomatic cholelithiasis   Postoperative Diagnosis: Same   Procedure(s) Performed: Laparoscopic cholecystectomy   Surgeon: Ria Comment C. Constance Haw, MD   Assistants: No qualified resident was available   Anesthesia: General endotracheal   Anesthesiologist: Lenice Llamas, MD    Specimens: Gallbladder    Estimated Blood Loss: Minimal    Blood Replacement: None    Complications: None    Operative Findings: Distended gallbladder    Procedure: The patient was taken to the operating room and placed supine. General endotracheal anesthesia was induced. Intravenous antibiotics were administered per protocol. An orogastric tube positioned to decompress the stomach. The abdomen was prepared and draped in the usual sterile fashion.    A supraumbilical incision was made and a Veress technique was utilized to achieve pneumoperitoneum to 15 mmHg with carbon dioxide. A 11 mm optiview port was placed through the supraumbilical region, and a 10 mm 0-degree operative laparoscope was introduced. The area underlying the trocar and Veress needle were inspected and without evidence of injury.  Remaining trocars were placed under direct vision. Two 5 mm ports were placed in the right abdomen, between the anterior axillary and midclavicular line.  A final 11 mm port was placed through the mid-epigastrium, near the falciform ligament.    The gallbladder fundus was elevated cephalad and the infundibulum was retracted to the patient's right. The gallbladder/cystic duct junction was skeletonized. The cystic artery noted in the triangle of Calot and was also skeletonized.  We then continued liberal medial and lateral dissection until the critical view of safety was achieved.    The cystic duct and cystic artery were doubly clipped and divided. There were some small gravel like stones in the cystic duct stump end that I attempted to suction out as much as possible. There were  a few small flakes remaining but I did not want to disrupt the clips, so I left them.  There was a small posterior vessel that was clipped and divided. The gallbladder was then dissected from the liver bed with electrocautery. The specimen was placed in an Endopouch and was retrieved through the epigastric site.   Final inspection revealed acceptable hemostasis. Surgical Emogene Morgan was placed in the gallbladder bed. Trocars were removed and pneumoperitoneum was released. 0 Vicryl fascial sutures were used to close umbilical port site and the epigastric site was over the rib so it was not closed.  Skin incisions were closed with 4-0 Monocryl subcuticular sutures and Dermabond. The patient was awakened from anesthesia and extubated without complication.    Curlene Labrum, MD [facility] [address] [place], [place] [address] [phone] (office)

## 2018-12-20 NOTE — Transfer of Care (Signed)
Immediate Anesthesia Transfer of Care Note  Patient: ALAKAI HERSI  Procedure(s) Performed: LAPAROSCOPIC CHOLECYSTECTOMY (N/A Abdomen)  Patient Location: PACU  Anesthesia Type:General  Level of Consciousness: awake and patient cooperative  Airway & Oxygen Therapy: Patient Spontanous Breathing  Post-op Assessment: Report given to RN and Post -op Vital signs reviewed and stable  Post vital signs: Reviewed and stable  Last Vitals:  Vitals Value Taken Time  BP    Temp    Pulse 64 12/20/18 1207  Resp 12 12/20/18 1207  SpO2 95 % 12/20/18 1207  Vitals shown include unvalidated device data.  Last Pain:  Vitals:   12/20/18 0934  TempSrc: Oral  PainSc: 0-No pain      Patients Stated Pain Goal: 9 ([id number])  Complications: No apparent anesthesia complications

## 2018-12-20 NOTE — Discharge Instructions (Signed)
Discharge Laparoscopic Surgery Instructions:  Common Complaints: Right shoulder pain is common after laparoscopic surgery. This is secondary to the gas used in the surgery being trapped under the diaphragm.  Walk to help your body absorb the gas. This will improve in a few days. Pain at the port sites are common, especially the larger port sites. This will improve with time.  Some nausea is common and poor appetite. The main goal is to stay hydrated the first few days after surgery.   Diet/ Activity: Diet as tolerated. You may not have an appetite, but it is important to stay hydrated. Drink 64 ounces of water a day. Your appetite will return with time.  Shower per your regular routine daily.  Do not take hot showers. Take warm showers that are less than 10 minutes. Rest and listen to your body, but do not remain in bed all day.  Walk everyday for at least 15-20 minutes. Deep cough and move around every 1-2 hours in the first few days after surgery.  Do not lift > 10 lbs, perform excessive bending, pushing, pulling, squatting for 1-2 weeks after surgery.  Do not pick at the dermabond glue on your incision sites.  This glue film will remain in place for 1-2 weeks and will start to peel off.  Do not place lotions or balms on your incision unless instructed to specifically by Dr. Constance Haw.   Medication: Please restart your Plavix on 12/21/18.  Take tylenol and ibuprofen as needed for pain control, alternating every 4-6 hours.  Example:  Tylenol 1000mg  @ 6am, 12noon, 6pm, 28midnight (Do not exceed 4000mg  of tylenol a day). Ibuprofen 800mg  @ 9am, 3pm, 9pm, 3am (Do not exceed 3600mg  of ibuprofen a day).  Take the roxicodone or hydrocodone you have at home as needed for breakthrough pain.  Take Colace for constipation related to narcotic pain medication. If you do not have a bowel movement in 2 days, take Miralax over the counter.  Drink plenty of water to also prevent constipation.   Contact  Information: If you have questions or concerns, please call our office, [phone], Monday- Thursday 8AM-5PM and Friday 8AM-12Noon.  If it is after hours or on the weekend, please call [facility], [phone], and ask to speak to the surgeon on call for Dr. Constance Haw at [facility].    Laparoscopic Cholecystectomy, Care After This sheet gives you information about how to care for yourself after your procedure. Your health care provider may also give you more specific instructions. If you have problems or questions, contact your health care provider. What can I expect after the procedure? After the procedure, it is common to have:  Pain at your incision sites. You will be given medicines to control this pain.  Mild nausea or vomiting.  Bloating and possible shoulder pain from the air-like gas that was used during the procedure. Follow these instructions at home: Incision care   Follow instructions from your health care provider about how to take care of your incisions. Make sure you: ? Wash your hands with soap and water before you change your bandage (dressing). If soap and water are not available, use hand sanitizer. ? Change your dressing as told by your health care provider. ? Leave stitches (sutures), skin glue, or adhesive strips in place. These skin closures may need to be in place for 2 weeks or longer. If adhesive strip edges start to loosen and curl up, you may trim the loose edges. Do not remove adhesive  strips completely unless your health care provider tells you to do that.  Do not take baths, swim, or use a hot tub until your health care provider approves.  You may shower.  Check your incision area every day for signs of infection. Check for: ? More redness, swelling, or pain. ? More fluid or blood. ? Warmth. ? Pus or a bad smell. Activity  Do not drive or use heavy machinery while taking prescription pain medicine.  Do not lift anything that is heavier than  10 lb (4.5 kg) until your health care provider approves.  Do not play contact sports until your health care provider approves.  Do not drive for 24 hours if you were given a medicine to help you relax (sedative).  Rest as needed. Do not return to work or school until your health care provider approves. General instructions  Take over-the-counter and prescription medicines only as told by your health care provider.  To prevent or treat constipation while you are taking prescription pain medicine, your health care provider may recommend that you: ? Drink enough fluid to keep your urine clear or pale yellow. ? Take over-the-counter or prescription medicines. ? Eat foods that are high in fiber, such as fresh fruits and vegetables, whole grains, and beans. ? Limit foods that are high in fat and processed sugars, such as fried and sweet foods. Contact a health care provider if:  You develop a rash.  You have more redness, swelling, or pain around your incisions.  You have more fluid or blood coming from your incisions.  Your incisions feel warm to the touch.  You have pus or a bad smell coming from your incisions.  You have a fever.  One or more of your incisions breaks open. Get help right away if:  You have trouble breathing.  You have chest pain.  You have increasing pain in your shoulders.  You faint or feel dizzy when you stand.  You have severe pain in your abdomen.  You have nausea or vomiting that lasts for more than one day.  You have leg pain. This information is not intended to replace advice given to you by your health care provider. Make sure you discuss any questions you have with your health care provider. Document Released: 12/20/2004 Document Revised: 12/02/2016 Document Reviewed: 06/08/2015 Elsevier Patient Education  2020 [employer] Anesthesia, Adult, Care After This sheet gives you information about how to care for yourself after  your procedure. Your health care provider may also give you more specific instructions. If you have problems or questions, contact your health care provider. What can I expect after the procedure? After the procedure, the following side effects are common:  Pain or discomfort at the IV site.  Nausea.  Vomiting.  Sore throat.  Trouble concentrating.  Feeling cold or chills.  Weak or tired.  Sleepiness and fatigue.  Soreness and body aches. These side effects can affect parts of the body that were not involved in surgery. Follow these instructions at home:  For at least 24 hours after the procedure:  Have a responsible adult stay with you. It is important to have someone help care for you until you are awake and alert.  Rest as needed.  Do not: ? Participate in activities in which you could fall or become injured. ? Drive. ? Use heavy machinery. ? Drink alcohol. ? Take sleeping pills or medicines that cause drowsiness. ? Make important decisions or sign  legal documents. ? Take care of children on your own. Eating and drinking  Follow any instructions from your health care provider about eating or drinking restrictions.  When you feel hungry, start by eating small amounts of foods that are soft and easy to digest (bland), such as toast. Gradually return to your regular diet.  Drink enough fluid to keep your urine pale yellow.  If you vomit, rehydrate by drinking water, juice, or clear broth. General instructions  If you have sleep apnea, surgery and certain medicines can increase your risk for breathing problems. Follow instructions from your health care provider about wearing your sleep device: ? Anytime you are sleeping, including during daytime naps. ? While taking prescription pain medicines, sleeping medicines, or medicines that make you drowsy.  Return to your normal activities as told by your health care provider. Ask your health care provider what activities  are safe for you.  Take over-the-counter and prescription medicines only as told by your health care provider.  If you smoke, do not smoke without supervision.  Keep all follow-up visits as told by your health care provider. This is important. Contact a health care provider if:  You have nausea or vomiting that does not get better with medicine.  You cannot eat or drink without vomiting.  You have pain that does not get better with medicine.  You are unable to pass urine.  You develop a skin rash.  You have a fever.  You have redness around your IV site that gets worse. Get help right away if:  You have difficulty breathing.  You have chest pain.  You have blood in your urine or stool, or you vomit blood. Summary  After the procedure, it is common to have a sore throat or nausea. It is also common to feel tired.  Have a responsible adult stay with you for the first 24 hours after general anesthesia. It is important to have someone help care for you until you are awake and alert.  When you feel hungry, start by eating small amounts of foods that are soft and easy to digest (bland), such as toast. Gradually return to your regular diet.  Drink enough fluid to keep your urine pale yellow.  Return to your normal activities as told by your health care provider. Ask your health care provider what activities are safe for you. This information is not intended to replace advice given to you by your health care provider. Make sure you discuss any questions you have with your health care provider. Document Released: 03/28/2000 Document Revised: 12/23/2016 Document Reviewed: 08/05/2016 Elsevier Patient Education  2020 [employer].

## 2018-12-20 NOTE — Anesthesia Preprocedure Evaluation (Signed)
Anesthesia Evaluation  Patient identified by MRN, date of birth, ID band Patient awake    Reviewed: Allergy & Precautions, NPO status , Patient's Chart, lab work & pertinent test results  Airway Mallampati: II  TM Distance: >3 FB Neck ROM: Full    Dental no notable dental hx. (+) Teeth Intact   Pulmonary neg pulmonary ROS,    Pulmonary exam normal breath sounds clear to auscultation       Cardiovascular Exercise Tolerance: Good + CAD and + Past MI  Normal cardiovascular exam+ pacemaker + Cardiac Defibrillator I Rhythm:Regular Rate:Normal  Reports can walk a mile  Not pacer dependent  Plan Magnet to disble AICD during OR Will place prior to induction    Neuro/Psych negative neurological ROS  negative psych ROS   GI/Hepatic Neg liver ROS, GERD  Medicated and Controlled,  Endo/Other  negative endocrine ROS  Renal/GU Renal diseaseStones   negative genitourinary   Musculoskeletal negative musculoskeletal ROS (+)   Abdominal   Peds negative pediatric ROS (+)  Hematology negative hematology ROS (+)   Anesthesia Other Findings   Reproductive/Obstetrics negative OB ROS                             Anesthesia Physical Anesthesia Plan  ASA: IV  Anesthesia Plan: General   Post-op Pain Management:    Induction: Intravenous  PONV Risk Score and Plan: 2 and Ondansetron, Dexamethasone, Midazolam and Treatment may vary due to age or medical condition  Airway Management Planned: Oral ETT  Additional Equipment:   Intra-op Plan:   Post-operative Plan: Extubation in OR  Informed Consent: I have reviewed the patients History and Physical, chart, labs and discussed the procedure including the risks, benefits and alternatives for the proposed anesthesia with the patient or authorized representative who has indicated his/her understanding and acceptance.     Dental advisory given  Plan  Discussed with: CRNA  Anesthesia Plan Comments: (Plan Full PPE use Plan GETA D/W PT -WTP with same after Q&A)        Anesthesia Quick Evaluation

## 2018-12-21 LAB — SURGICAL PATHOLOGY

## 2018-12-21 NOTE — Progress Notes (Signed)
Remote ICD transmission.   

## 2019-01-08 ENCOUNTER — Other Ambulatory Visit: Payer: Self-pay

## 2019-01-08 ENCOUNTER — Telehealth (INDEPENDENT_AMBULATORY_CARE_PROVIDER_SITE_OTHER): Payer: Self-pay | Admitting: General Surgery

## 2019-01-08 DIAGNOSIS — K8020 Calculus of gallbladder without cholecystitis without obstruction: Secondary | ICD-10-CM

## 2019-01-08 NOTE — Progress Notes (Signed)
[facility]  I am calling the patient for post operative evaluation due to the current COVID 19 pandemic.  The patient had a laparoscopic cholecystectomy on 12/20/18. The patient reports that they are doing fine. The are tolerating a diet, having good pain control, and having regular Bms but are a little looser.  The patient has no concerns. He says he has had some nausea and vomited twice after some bbq.  Incisions are healing and glue is peeled.  Will see the patient PRN.   Pathology: FINAL MICROSCOPIC DIAGNOSIS:   A. GALLBLADDER, CHOLECYSTECTOMY:  - Chronic cholecystitis with cholelithiasis   Curlene Labrum, MD [facility] [address] [place],  [phone] (office)

## 2019-01-28 ENCOUNTER — Other Ambulatory Visit (HOSPITAL_COMMUNITY): Payer: Self-pay | Admitting: Internal Medicine

## 2019-03-23 ENCOUNTER — Other Ambulatory Visit (HOSPITAL_COMMUNITY): Payer: Self-pay | Admitting: Cardiology

## 2019-03-27 ENCOUNTER — Ambulatory Visit (INDEPENDENT_AMBULATORY_CARE_PROVIDER_SITE_OTHER): Payer: BC Managed Care – PPO | Admitting: Dermatology

## 2019-03-27 ENCOUNTER — Encounter: Payer: Self-pay | Admitting: Dermatology

## 2019-03-27 ENCOUNTER — Other Ambulatory Visit: Payer: Self-pay

## 2019-03-27 DIAGNOSIS — L570 Actinic keratosis: Secondary | ICD-10-CM | POA: Diagnosis not present

## 2019-03-27 DIAGNOSIS — D0439 Carcinoma in situ of skin of other parts of face: Secondary | ICD-10-CM

## 2019-03-27 DIAGNOSIS — D485 Neoplasm of uncertain behavior of skin: Secondary | ICD-10-CM

## 2019-03-27 DIAGNOSIS — C4442 Squamous cell carcinoma of skin of scalp and neck: Secondary | ICD-10-CM

## 2019-03-27 DIAGNOSIS — D492 Neoplasm of unspecified behavior of bone, soft tissue, and skin: Secondary | ICD-10-CM

## 2019-03-28 NOTE — Progress Notes (Addendum)
   Follow-Up Visit   Subjective  Derrick Lane is a 59 y.o. male who presents for the following: Skin Problem (Patient here today for spot behind his left ear x couple of months.  Patient states that the spot will scab over and if he picks at the spot it will bleed some.  Patient denies any pain.).  New growths Location: Head and neck Duration: 6 months Quality: Increased number and size Associated Signs/Symptoms: Minor soreness Modifying Factors:  Severity:  Timing: Context: History of skin cancers  The following portions of the chart were reviewed this encounter and updated as appropriate:     Objective  Well appearing patient in no apparent distress; mood and affect are within normal limits.  All skin waist up examined. No atypical moles, no recurrent non-melanoma skin cancer.   Assessment & Plan  AK (actinic keratosis) (4) Left Forearm - Posterior; Right Forearm - Posterior; Mid Parietal Scalp; Right Temporal Scalp  Destruction of lesion - Left Forearm - Posterior, Mid Parietal Scalp, Right Forearm - Posterior, Right Temporal Scalp  Destruction method: cryotherapy   Lesion destroyed using liquid nitrogen: Yes   Outcome: patient tolerated procedure well with no complications    Neoplasm of skin (3) Right Forearm - Anterior  Skin / nail biopsy Type of biopsy: tangential   Informed consent: discussed and consent obtained   Timeout: patient name, date of birth, surgical site, and procedure verified   Procedure prep:  Patient was prepped and draped in usual sterile fashion Prep type:  Chlorhexidine Anesthesia: the lesion was anesthetized in a standard fashion   Anesthetic:  1% lidocaine w/ epinephrine 1-100,000 local infiltration Instrument used: flexible razor blade   Hemostasis achieved with: ferric subsulfate   Outcome: patient tolerated procedure well   Post-procedure details: sterile dressing applied and wound care instructions given   Dressing type: petrolatum     Additional details:  Patient identified lesion of concern.  Lesion identified by physician.  Specimen 1 - Surgical pathology Differential Diagnosis: BCC vs SCC Check Margins: No  Left Posterior Neck  Skin / nail biopsy Type of biopsy: tangential   Informed consent: discussed and consent obtained   Anesthesia: the lesion was anesthetized in a standard fashion   Anesthetic:  1% lidocaine w/ epinephrine 1-100,000 local infiltration Instrument used: flexible razor blade   Hemostasis achieved with: ferric subsulfate   Outcome: patient tolerated procedure well   Post-procedure details: sterile dressing applied and wound care instructions given   Dressing type: petrolatum   Additional details:  Patient identified lesion of concern.  Lesion identified by physician.  Specimen 2 - Surgical pathology Differential Diagnosis: BCC vs SCC Check Margins: No  Left Temple  Skin / nail biopsy Type of biopsy: tangential   Informed consent: discussed and consent obtained   Anesthesia: the lesion was anesthetized in a standard fashion   Anesthetic:  1% lidocaine w/ epinephrine 1-100,000 local infiltration Instrument used: flexible razor blade   Hemostasis achieved with: ferric subsulfate   Outcome: patient tolerated procedure well   Post-procedure details: sterile dressing applied and wound care instructions given   Dressing type: petrolatum   Additional details:  Patient identified lesion of concern.  Lesion identified by physician.  Specimen 3 - Surgical pathology Differential Diagnosis: BCC vs SCC Check Margins: No

## 2019-04-01 ENCOUNTER — Telehealth: Payer: Self-pay

## 2019-04-01 NOTE — Telephone Encounter (Signed)
-----   Message from Lavonna Monarch, MD sent at 03/29/2019 10:50 AM EDT ----- Left forearm is recurrent, I'd prefer Mohs. Others can schedule with me 91min

## 2019-04-01 NOTE — Telephone Encounter (Signed)
Phone call to patient with his Pathology results.  Voicemail left for patient to give the office a call back.

## 2019-04-01 NOTE — Telephone Encounter (Addendum)
Spoke with Dr. Denna Haggard regarding error of location for left forearm it should be right forearm. Per Dr. Denna Haggard patient will only need a 30 min surgery with him.  ----- Message from Lavonna Monarch, MD sent at 03/29/2019 10:50 AM EDT ----- Left forearm is recurrent, I'd prefer Mohs. Others can schedule with me 27min

## 2019-04-08 NOTE — Telephone Encounter (Signed)
Phone call from patient returning our call for his Pathology results.  Pathology results given to patient and appointment scheduled with Dr. Denna Haggard on 04/25/2019,

## 2019-04-08 NOTE — Telephone Encounter (Signed)
-----   Message from Lavonna Monarch, MD sent at 03/29/2019 10:50 AM EDT ----- Left forearm is recurrent, I'd prefer Mohs. Others can schedule with me 71min

## 2019-04-24 ENCOUNTER — Encounter: Payer: Self-pay | Admitting: *Deleted

## 2019-04-24 ENCOUNTER — Ambulatory Visit (INDEPENDENT_AMBULATORY_CARE_PROVIDER_SITE_OTHER): Payer: BC Managed Care – PPO | Admitting: *Deleted

## 2019-04-24 ENCOUNTER — Other Ambulatory Visit (HOSPITAL_COMMUNITY): Payer: Self-pay | Admitting: Cardiology

## 2019-04-24 DIAGNOSIS — I5022 Chronic systolic (congestive) heart failure: Secondary | ICD-10-CM

## 2019-04-24 LAB — CUP PACEART REMOTE DEVICE CHECK
Battery Remaining Longevity: 83 mo
Battery Remaining Percentage: 79 %
Battery Voltage: 2.99 V
Brady Statistic AP VP Percent: 1 %
Brady Statistic AP VS Percent: 1 %
Brady Statistic AS VP Percent: 1 %
Brady Statistic AS VS Percent: 99 %
Brady Statistic RA Percent Paced: 1 %
Brady Statistic RV Percent Paced: 1 %
Date Time Interrogation Session: 20210420165330
HighPow Impedance: 48 Ohm
HighPow Impedance: 48 Ohm
Implantable Lead Implant Date: 20190816
Implantable Lead Implant Date: 20190816
Implantable Lead Location: 753862
Implantable Lead Location: 753862
Implantable Lead Model: 185
Implantable Lead Serial Number: 348730
Implantable Pulse Generator Implant Date: 20190816
Lead Channel Impedance Value: 350 Ohm
Lead Channel Impedance Value: 410 Ohm
Lead Channel Pacing Threshold Amplitude: 0.75 V
Lead Channel Pacing Threshold Amplitude: 1.25 V
Lead Channel Pacing Threshold Pulse Width: 0.5 ms
Lead Channel Pacing Threshold Pulse Width: 0.5 ms
Lead Channel Sensing Intrinsic Amplitude: 11.1 mV
Lead Channel Sensing Intrinsic Amplitude: 5 mV
Lead Channel Setting Pacing Amplitude: 2 V
Lead Channel Setting Pacing Amplitude: 2.5 V
Lead Channel Setting Pacing Pulse Width: 0.5 ms
Lead Channel Setting Sensing Sensitivity: 0.5 mV
Pulse Gen Serial Number: 9763545

## 2019-04-24 NOTE — Progress Notes (Signed)
ICD Remote  

## 2019-04-25 ENCOUNTER — Encounter: Payer: Self-pay | Admitting: Dermatology

## 2019-04-25 ENCOUNTER — Ambulatory Visit (INDEPENDENT_AMBULATORY_CARE_PROVIDER_SITE_OTHER): Payer: BC Managed Care – PPO | Admitting: Dermatology

## 2019-04-25 ENCOUNTER — Other Ambulatory Visit: Payer: Self-pay

## 2019-04-25 DIAGNOSIS — C4442 Squamous cell carcinoma of skin of scalp and neck: Secondary | ICD-10-CM | POA: Diagnosis not present

## 2019-04-25 DIAGNOSIS — C4492 Squamous cell carcinoma of skin, unspecified: Secondary | ICD-10-CM

## 2019-04-25 NOTE — Patient Instructions (Signed)
Wound Care Instructions  On the day following your surgery, you should begin doing daily dressing changes: 1. Remove the old dressing and discard it. 2. Cleanse the wound gently with tap water. This may be done in the shower or by placing a wet gauze pad directly on the wound and letting it soak for several minutes. 3. It is important to gently remove any dried blood from the wound in order to encourage healing. This may be done by gently rolling a moistened Q-tip on the dried blood. Do not pick at the wound. 4. If the wound should start to bleed, continue cleaning the wound, then place a moist gauze pad on the wound and hold pressure for a few minutes.  5. Make sure you then dry the skin surrounding the wound completely or the tape will not stick to the skin. Do not use cotton balls on the wound. 6. After the wound is clean and dry, apply the ointment gently with a Q-tip. 7. Cut a non-stick pad to fit the size of the wound. Lay the pad flush to the wound. If the wound is draining, you may want to reinforce it with a small amount of gauze on top of the non-stick pad for a little added compression to the area. 8. Use the tape to seal the area completely. 9. Select from the following with respect to your individual situation: 1. If your wound has been stitched closed: continue the above steps 1-8 at least daily until your sutures are removed. 2. If your wound has been left open to heal: continue steps 1-8 at least daily for the first 3-4 weeks. 10. We would like for you to take a few extra precautions for at least the next week. 1. Sleep with your head elevated on pillows if our wound is on your head. 2. Do not bend over or lift heavy items to reduce the chance of elevated blood pressure to the wound 3. Do not participate in particularly strenuous activities.   Below is a list of dressing supplies you might need.  . Cotton-tipped applicators - Q-tips . Gauze pads (2x2 and/or 4x4) - All-Purpose  Sponges . Non-stick dressing material - Telfa . Tape - Paper or Hypafix . New and clean tube of petroleum jelly - Vaseline    Comments on Post-Operative Period 1. Slight swelling and redness often appear around the wound. This is normal and will disappear within several days following the surgery. 2. The healing wound will drain a brownish-red-yellow discharge during healing. This is a normal phase of wound healing. As the wound begins to heal, the drainage may increase in amount. Again, this drainage is normal. 3. Notify us if the drainage becomes persistently bloody, excessively swollen, or intensely painful or develops a foul odor or red streaks.  4. If you should experience mild discomfort during the healing phase, you may take an aspirin-Hope Holst medication such as Tylenol (acetaminophen). Notify us if the discomfort is severe or persistent. Avoid alcoholic beverages when taking pain medicine.  In Case of Wound Hemorrhage A wound hemorrhage is when the bandage suddenly becomes soaked with bright red blood and flows profusely. If this happens, sit down or lie down with your head elevated. If the wound has a dressing on it, do not remove the dressing. Apply pressure to the existing gauze. If the wound is not covered, use a gauze pad to apply pressure and continue applying the pressure for 20 minutes without peeking. DO NOT COVER THE WOUND WITH   A LARGE TOWEL OR WASH CLOTH. Release your hand from the wound site but do not remove the dressing. If the bleeding has stopped, gently clean around the wound. Leave the dressing in place for 24 hours if possible. This wait time allows the blood vessels to close off so that you do not spark a new round of bleeding by disrupting the newly clotted blood vessels with an immediate dressing change. If the bleeding does not subside, continue to hold pressure. If matters are out of your control, contact an After Hours clinic or go to the Emergency Room.    

## 2019-04-30 ENCOUNTER — Telehealth (INDEPENDENT_AMBULATORY_CARE_PROVIDER_SITE_OTHER): Payer: BC Managed Care – PPO | Admitting: Dermatology

## 2019-04-30 ENCOUNTER — Telehealth: Payer: Self-pay

## 2019-04-30 DIAGNOSIS — C4442 Squamous cell carcinoma of skin of scalp and neck: Secondary | ICD-10-CM

## 2019-04-30 NOTE — Telephone Encounter (Signed)
Phone call to patient to see if he has an NTS appointment. He mentioned that he did have one for the 29th; however, he cancelled because his wife is going to take them out. He will keep his appointment with ST on May 13th to treat his other spot.  I told him that ST would be reaching out to discuss with him his path results.

## 2019-04-30 NOTE — Telephone Encounter (Signed)
-----   Message from Lavonna Monarch, MD sent at 04/30/2019  5:49 AM EDT ----- Dr T will discuss when comes for suture removal (if NOT coming for this, have me call him)

## 2019-04-30 NOTE — Telephone Encounter (Signed)
I called pt ([id number]) and informed him of dermpath showing one positive margins meaning there is possibility of local recurrence. Informed him this could never become melanoma and 99% never spread beyond skin. I mentioned MOHs surgery as the highest next step cure but we chose to not yet schedule this. I'll check area at his follow up visit next month.

## 2019-05-02 ENCOUNTER — Ambulatory Visit: Payer: BC Managed Care – PPO

## 2019-05-15 ENCOUNTER — Encounter: Payer: Self-pay | Admitting: *Deleted

## 2019-05-16 ENCOUNTER — Other Ambulatory Visit: Payer: Self-pay

## 2019-05-16 ENCOUNTER — Encounter: Payer: Self-pay | Admitting: Dermatology

## 2019-05-16 ENCOUNTER — Ambulatory Visit (INDEPENDENT_AMBULATORY_CARE_PROVIDER_SITE_OTHER): Payer: BC Managed Care – PPO | Admitting: Dermatology

## 2019-05-16 DIAGNOSIS — C44612 Basal cell carcinoma of skin of right upper limb, including shoulder: Secondary | ICD-10-CM | POA: Diagnosis not present

## 2019-05-16 NOTE — Progress Notes (Signed)
   Follow-Up Visit   Subjective  Derrick Lane is a 59 y.o. male who presents for the following: Procedure (Here to treat right forearm-atypical basaloid and left temple- cis ).  Basal cell carcinoma Location: Right wrist Duration:  Quality:  Associated Signs/Symptoms: Modifying Factors:  Severity:  Timing: Context: Here for treatment  The following portions of the chart were reviewed this encounter and updated as appropriate: Tobacco  Allergies  Meds  Problems  Med Hx  Surg Hx  Fam Hx      Objective  Well appearing patient in no apparent distress; mood and affect are within normal limits.  A focused examination was performed including Head and right arm. Relevant physical exam findings are noted in the Assessment and Plan.   Assessment & Plan  Basal cell carcinoma (BCC) of skin of right upper extremity including shoulder Right Forearm - Anterior  Skin excision  Lesion length (cm):  1.5 Lesion width (cm):  1 Margin per side (cm):  0 Total excision diameter (cm):  1.5 Informed consent: discussed and consent obtained   Timeout: patient name, date of birth, surgical site, and procedure verified   Procedure prep:  Patient was prepped and draped in usual sterile fashion Prep type:  Chlorhexidine Anesthesia: the lesion was anesthetized in a standard fashion   Anesthetic:  1% lidocaine w/ epinephrine 1-100,000 local infiltration Instrument used: #15 blade   Hemostasis achieved with: suture   Outcome: patient tolerated procedure well with no complications   Post-procedure details: sterile dressing applied and wound care instructions given   Dressing type: petrolatum   Additional details:  Initial curettage x3 showed moderate depth.  Base electrocauterized, repeat curette followed by narrow margin excision and simple closure.  Specimen 1 - Surgical pathology Differential Diagnosis: BASALOID Check Margins: YES PROX MARGIN STAINED [id number]  Cx3 CAUTERY &  EXC [id number] 4-0 ETHILON X3  PROX MARGIN STAINED

## 2019-05-17 ENCOUNTER — Encounter: Payer: Self-pay | Admitting: Dermatology

## 2019-05-20 NOTE — Progress Notes (Addendum)
   Follow-Up Visit   Subjective  Derrick Lane is a 59 y.o. male who presents for the following: Skin Cancer (treatment today bcc scc cis).    The following portions of the chart were reviewed this encounter and updated as appropriate:     Objective  Well appearing patient in no apparent distress; mood and affect are within normal limits.  A focused examination was performed including neck. Relevant physical exam findings are noted in the Assessment and Plan.   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  Skin excision  Lesion length (cm):  1.5 Lesion width (cm):  1 Margin per side (cm):  0.2 Total excision diameter (cm):  1.9 Informed consent: discussed and consent obtained   Timeout: patient name, date of birth, surgical site, and procedure verified   Procedure prep:  Patient was prepped and draped in usual sterile fashion Prep type:  Chlorhexidine Instrument used: #15 blade   Hemostasis achieved with: suture   Outcome: patient tolerated procedure well with no complications   Additional details:  Initial curettage x3 showed deep dermal invasion, ED base, recuret, narrow margin excision, layered close.  Destruction of lesion  Destruction method: electrodesiccation and curettage   Timeout:  patient name, date of birth, surgical site, and procedure verified Anesthesia: the lesion was anesthetized in a standard fashion   Anesthetic:  1% lidocaine w/ epinephrine 1-100,000 local infiltration Curettage performed in three different directions: Yes   Electrodesiccation performed over the curetted area: Yes   Curettage cycles:  3 Lesion length (cm):  2 Lesion width (cm):  2 Margin per side (cm):  0 Final wound size (cm):  2 Hemostasis achieved with:  pressure, aluminum chloride and electrodesiccation Outcome: patient tolerated procedure well with no complications   Post-procedure details: wound care instructions given    Skin repair Complexity:   Intermediate Informed consent: discussed and consent obtained   Timeout: patient name, date of birth, surgical site, and procedure verified   Subcutaneous layers (deep stitches):  Suture size:  4-0 Suture type: Vicryl (polyglactin 910)   Stitches:  Buried vertical mattress Fine/surface layer approximation (top stitches):  Suture size:  4-0 Suture type comment:  Nylon Stitches: simple interrupted   Suture removal (days):  10 Hemostasis achieved with: pressure Outcome: patient tolerated procedure well with no complications   Post-procedure details: sterile dressing applied and wound care instructions given   Dressing type: petrolatum    Specimen 1 - Surgical pathology Differential Diagnosis: scc Check Margins: superior margin stain Prev bx # [id number]

## 2019-05-20 NOTE — Addendum Note (Signed)
Addended by: Ashok Cordia B on: 05/20/2019 10:43 AM   Modules accepted: Orders

## 2019-05-21 ENCOUNTER — Other Ambulatory Visit (HOSPITAL_COMMUNITY): Payer: Self-pay | Admitting: Internal Medicine

## 2019-06-06 ENCOUNTER — Ambulatory Visit (INDEPENDENT_AMBULATORY_CARE_PROVIDER_SITE_OTHER): Payer: BC Managed Care – PPO | Admitting: Dermatology

## 2019-06-06 ENCOUNTER — Other Ambulatory Visit: Payer: Self-pay | Admitting: *Deleted

## 2019-06-06 ENCOUNTER — Encounter: Payer: Self-pay | Admitting: Dermatology

## 2019-06-06 ENCOUNTER — Other Ambulatory Visit: Payer: Self-pay

## 2019-06-06 DIAGNOSIS — C44329 Squamous cell carcinoma of skin of other parts of face: Secondary | ICD-10-CM

## 2019-06-06 DIAGNOSIS — C4492 Squamous cell carcinoma of skin, unspecified: Secondary | ICD-10-CM

## 2019-06-06 NOTE — Patient Instructions (Signed)

## 2019-06-06 NOTE — Progress Notes (Signed)
Left temple , curet x 3, cautery with excision 2.0cm. no specimen sent.wife to take out sutures in 9 days. 5.0 nylon x 3

## 2019-06-06 NOTE — Progress Notes (Signed)
° °  Follow-Up Visit   Subjective  Derrick Lane is a 59 y.o. male who presents for the following: Procedure (Patient here today for treatment [place]).  Skin cancer left temple Location:  Duration:  Quality:  Associated Signs/Symptoms: Modifying Factors:  Severity:  Timing: Context: Treatment  The following portions of the chart were reviewed this encounter and updated as appropriate: Tobacco   Allergies   Meds   Problems   Med Hx   Surg Hx   Fam Hx       Objective  Well appearing patient in no apparent distress; mood and affect are within normal limits.  A focused examination of the head and neck was performed today.   Assessment & Plan  Squamous cell carcinoma of skin Left Temple  Skin excision  Lesion length (cm):  1.3 Lesion width (cm):  1 Margin per side (cm):  0.1 Total excision diameter (cm):  1.5 Informed consent: discussed and consent obtained   Timeout: patient name, date of birth, surgical site, and procedure verified   Procedure prep:  Patient was prepped and draped in usual sterile fashion Prep type:  Chlorhexidine Anesthesia: the lesion was anesthetized in a standard fashion   Anesthetic:  1% lidocaine w/ epinephrine 1-100,000 local infiltration Instrument used: #15 blade   Hemostasis achieved with: suture   Outcome: patient tolerated procedure well with no complications   Post-procedure details: sterile dressing applied and wound care instructions given   Dressing type: petrolatum   Additional details:  Although biopsy indicated carcinoma in situ with involvement of the basal deeper invasion could not be excluded, curettage showed that this was definitely a squamous cell cancer involving the dermis.  Triple curetted with cautery followed by narrow margin excision and simple closure. Follow-up to take care of biopsy-proven skin cancer on the left temple today.  The biopsy stated it was a carcinoma in situ with involvement of the base so deeper  invasion could not be ruled out.  After cleaning and numbing, curettage did show extension to the base so the lesion was cauterized re-curetted and then narrow margin ellipse.  Since he wants his wife to remove the stitches, I did simple approximating stitches.  There are 3 stitches on the outside of the wound which can be removed in 9 or 10 days (June 12 or 13).  He can call me 24/7 if there is any issue relating to this.  If all goes well then I will recheck general skin examination in the late fall.  Because the lesion was removed in pieces, there will be no second microscopic but this should not affect outcome.

## 2019-06-16 ENCOUNTER — Encounter: Payer: Self-pay | Admitting: Dermatology

## 2019-06-27 ENCOUNTER — Other Ambulatory Visit (HOSPITAL_COMMUNITY): Payer: Self-pay | Admitting: Internal Medicine

## 2019-07-24 ENCOUNTER — Ambulatory Visit (INDEPENDENT_AMBULATORY_CARE_PROVIDER_SITE_OTHER): Payer: BC Managed Care – PPO | Admitting: *Deleted

## 2019-07-24 DIAGNOSIS — I429 Cardiomyopathy, unspecified: Secondary | ICD-10-CM

## 2019-07-26 LAB — CUP PACEART REMOTE DEVICE CHECK
Battery Remaining Longevity: 81 mo
Battery Remaining Percentage: 77 %
Battery Voltage: 2.99 V
Brady Statistic AP VP Percent: 1 %
Brady Statistic AP VS Percent: 1 %
Brady Statistic AS VP Percent: 1 %
Brady Statistic AS VS Percent: 99 %
Brady Statistic RA Percent Paced: 1 %
Brady Statistic RV Percent Paced: 1 %
Date Time Interrogation Session: 20210722222533
HighPow Impedance: 46 Ohm
HighPow Impedance: 46 Ohm
Implantable Lead Implant Date: 20190816
Implantable Lead Implant Date: 20190816
Implantable Lead Location: 753862
Implantable Lead Location: 753862
Implantable Lead Model: 185
Implantable Lead Serial Number: 348730
Implantable Pulse Generator Implant Date: 20190816
Lead Channel Impedance Value: 310 Ohm
Lead Channel Impedance Value: 390 Ohm
Lead Channel Pacing Threshold Amplitude: 0.75 V
Lead Channel Pacing Threshold Amplitude: 1.25 V
Lead Channel Pacing Threshold Pulse Width: 0.5 ms
Lead Channel Pacing Threshold Pulse Width: 0.5 ms
Lead Channel Sensing Intrinsic Amplitude: 11.8 mV
Lead Channel Sensing Intrinsic Amplitude: 4.7 mV
Lead Channel Setting Pacing Amplitude: 2 V
Lead Channel Setting Pacing Amplitude: 2.5 V
Lead Channel Setting Pacing Pulse Width: 0.5 ms
Lead Channel Setting Sensing Sensitivity: 0.5 mV
Pulse Gen Serial Number: 9763545

## 2019-07-26 NOTE — Progress Notes (Signed)
Remote ICD transmission.   

## 2019-08-22 ENCOUNTER — Other Ambulatory Visit (HOSPITAL_COMMUNITY): Payer: Self-pay | Admitting: Internal Medicine

## 2019-09-05 ENCOUNTER — Other Ambulatory Visit: Payer: Self-pay

## 2019-09-05 ENCOUNTER — Ambulatory Visit (HOSPITAL_COMMUNITY)
Admission: RE | Admit: 2019-09-05 | Discharge: 2019-09-05 | Disposition: A | Discharge status: Home / Self Care | Payer: BC Managed Care – PPO | Source: Ambulatory Visit | Attending: Internal Medicine | Admitting: Internal Medicine

## 2019-09-05 VITALS — BP 102/70 | HR 58 | Wt 217.2 lb

## 2019-09-05 DIAGNOSIS — Z79899 Other long term (current) drug therapy: Secondary | ICD-10-CM | POA: Diagnosis not present

## 2019-09-05 DIAGNOSIS — I2510 Atherosclerotic heart disease of native coronary artery without angina pectoris: Secondary | ICD-10-CM | POA: Diagnosis not present

## 2019-09-05 DIAGNOSIS — Z95810 Presence of automatic (implantable) cardiac defibrillator: Secondary | ICD-10-CM | POA: Insufficient documentation

## 2019-09-05 DIAGNOSIS — Z7902 Long term (current) use of antithrombotics/antiplatelets: Secondary | ICD-10-CM | POA: Diagnosis not present

## 2019-09-05 DIAGNOSIS — I130 Hypertensive heart and chronic kidney disease with heart failure and stage 1 through stage 4 chronic kidney disease, or unspecified chronic kidney disease: Secondary | ICD-10-CM | POA: Insufficient documentation

## 2019-09-05 DIAGNOSIS — Z85828 Personal history of other malignant neoplasm of skin: Secondary | ICD-10-CM | POA: Diagnosis not present

## 2019-09-05 DIAGNOSIS — I5022 Chronic systolic (congestive) heart failure: Secondary | ICD-10-CM | POA: Diagnosis not present

## 2019-09-05 DIAGNOSIS — E785 Hyperlipidemia, unspecified: Secondary | ICD-10-CM | POA: Insufficient documentation

## 2019-09-05 DIAGNOSIS — Z7984 Long term (current) use of oral hypoglycemic drugs: Secondary | ICD-10-CM | POA: Insufficient documentation

## 2019-09-05 DIAGNOSIS — I252 Old myocardial infarction: Secondary | ICD-10-CM | POA: Diagnosis not present

## 2019-09-05 DIAGNOSIS — Z7982 Long term (current) use of aspirin: Secondary | ICD-10-CM | POA: Insufficient documentation

## 2019-09-05 DIAGNOSIS — K219 Gastro-esophageal reflux disease without esophagitis: Secondary | ICD-10-CM | POA: Diagnosis not present

## 2019-09-05 DIAGNOSIS — N183 Chronic kidney disease, stage 3 unspecified: Secondary | ICD-10-CM | POA: Insufficient documentation

## 2019-09-05 NOTE — Progress Notes (Addendum)
ADVANCED HF CLINIC NOTE  Patient ID: Derrick Lane, male   DOB: Sep 17, 1960, 59 y.o.   MRN: [id number] PCP: [facility] EP: Dr Caryl Comes  HPI:  Derrick Lane is a 59 y/o with h/o CAD s/p anterior MI in May 2011 treated with DES, LHC 08/03/2010: LAD 40% stent patent LCX 30-40% prox OM-1 stent patent RCA 40% EF 15%. S/P  ICD placement - St. Jude dual chamber ICD on Aug 05, 2010, and  Chronic systolic heart failure EF 35-40% 3/17  He not on Ace-I due to angioedema . He is not Spironolactone due to breast tenderness.  Does not tolerate hydralazine due to hypotension. (retired state trooper)  [place] 03/30/10 RA of 4, RV 33/1 (3), PA 40/17 (25), PCWP 12.  CO ([place]) 4.83 CI 2.42   CPX 07/08/10: pVO2 25.6 (74%) slope 34 ME/MVV 67% RER 1.17 Normal HR and BP response.   LHC 08/03/2010: LAD 40% stent patent LCX 30-40% prox OM-1 stent patent RCA 40% EF 15%.  06/13/11 ECHO 30% 11/22/12 ECHO 35-40%, RV good 3/17 EF 35-40%  ICD gen change 8/19   He returns for yearly follow up. He is here with his son. Feels good. Continues to manage the airport. Remains active. No CP, SOB, orthopnea or PND. No ICD firings. SBP 90-100.   ICD interrogation in clinic: No AF/VT. Volume ok. Personally reviewed   ROS: All systems negative except as listed in HPI, PMH and Problem List.  Past Medical History:  Diagnosis Date  . AICD (automatic cardioverter/defibrillator) present    [employer] and defibrillator  . BCC (basal cell carcinoma of skin) 06/07/2006   Left Ear (MOH's)  . BCC (basal cell carcinoma of skin) 03/27/2019   RIGHT FOREARM TX CX3 CAUTERY  AND EXC  . BCC (basal cell carcinoma of skin) Ulcerated 10/17/2012   Right Cheek Lateral (Dr. Tarri Glenn)  . CAD (coronary artery disease)    s/p Anterior MI 5/11. LAD stent  . Cancer ([place])    skin cancer  . Chronic kidney disease, stage III (moderate)   . Chronic systolic heart failure (HCC)    EF 10-15%.   . Dual implantable cardiac defibrillator    St. Jude-Analyze ST  study  . GERD (gastroesophageal reflux disease)   . History of kidney stones   . HLD (hyperlipidemia)   . Myocardial infarction ([place]) 05/03/2009   had "widow-maker".  . Nodular basal cell carcinoma (BCC) 10/16/2013   Right Neck (Dr. Tarri Glenn)  . Nodular basal cell carcinoma (BCC) 03/25/2015   Center Forehead (Dr. Tarri Glenn)  . Nodular basal cell carcinoma (BCC) 08/16/2016   Left Preauricular, and Right Neck (curet and 5FU)  . Nodulo-ulcerative basal cell carcinoma (BCC) 08/16/2016   Right Scalp (MOH's)  . Presence of permanent cardiac pacemaker   . SCC (squamous cell carcinoma) 03/27/2019   left posterior neck-[id number]  . SCC (squamous cell carcinoma) 03/27/2019   LEFT TEMPLE  cx3 excision  . SCC (squamous cell carcinoma) Well Diff 03/07/2018   Crown Scalp, and Right Cheek (curet and 5FU)  . 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 Tarri Glenn)  . 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 Tarri Glenn)  . Squamous cell carcinoma of skin 03/25/2015   in situ-right cheek (Dr beavers)  . Squamous cell carcinoma of skin 08/30/2018   well diff-Left cheek  . Superficial basal cell carcinoma (BCC)  10/17/2012   Left Upper Back (Dr. Tarri Glenn)    Current Outpatient Medications  Medication Sig Dispense Refill  . acetaminophen (TYLENOL) 500 MG tablet Take 1,000 mg by mouth every 6 (six) hours as needed for moderate pain or headache.     Marland Kitchen aspirin 81 MG tablet Take 81 mg by mouth at bedtime.     . candesartan (ATACAND) 8 MG tablet TAKE 2 TABLETS (16 MG TOTAL) BY MOUTH 2 (TWO) TIMES DAILY. 360 tablet 1  . carvedilol (COREG) 12.5 MG tablet TAKE 1 TABLET (12.5 MG TOTAL) BY MOUTH 2 (TWO) TIMES DAILY. 180 tablet 2  . clopidogrel (PLAVIX) 75 MG tablet TAKE 1 TABLET BY MOUTH ONCE DAILY 90 tablet 3  . eplerenone (INSPRA) 25 MG tablet TAKE 1 TABLET BY MOUTH EVERY DAY 90  tablet 1  . famotidine (PEPCID) 20 MG tablet Take 20 mg by mouth every evening.    Marland Kitchen FARXIGA 10 MG TABS tablet TAKE 10 MG BY MOUTH DAILY BEFORE BREAKFAST. 30 tablet 11  . fexofenadine (ALLEGRA) 180 MG tablet Take 180 mg by mouth at bedtime.    . furosemide (LASIX) 20 MG tablet Take 1 tablet (20 mg total) by mouth daily. 90 tablet 3  . ondansetron (ZOFRAN-ODT) 4 MG disintegrating tablet Take 4 mg by mouth every 8 (eight) hours as needed for nausea or vomiting.    . predniSONE (DELTASONE) 20 MG tablet Take 20 mg by mouth daily as needed (for beef and pork allergies).   1  . rosuvastatin (CRESTOR) 10 MG tablet Take 10 mg by mouth every evening.   3  . sildenafil (REVATIO) 20 MG tablet TAKE 1 TABLET (20 MG TOTAL) BY MOUTH DAILY AS NEEDED (FOR ED). 10 tablet 2   No current facility-administered medications for this encounter.     Vitals:   09/05/19 1055  BP: 102/70  Pulse: (!) 58  SpO2: 96%  Weight: 98.5 kg (217 lb 4 oz)    PHYSICAL EXAM: General:  Well appearing. No resp difficulty HEENT: normal Neck: supple. no JVD. Carotids 2+ bilat; no bruits. No lymphadenopathy or thryomegaly appreciated. Cor: PMI nondisplaced. Regular rate & rhythm. No rubs, gallops or murmurs. Lungs: clear Abdomen: soft, nontender, nondistended. No hepatosplenomegaly. No bruits or masses. Good bowel sounds. Extremities: no cyanosis, clubbing, rash, edema Neuro: alert & orientedx3, cranial nerves grossly intact. moves all 4 extremities w/o difficulty. Affect pleasant  ECG: NSR 62 Personally reviewed  ASSESSMENT & PLAN:  1) CAD:  - s/p anterior MI in May 2011 treated with DES - LHC 08/03/2010: LAD 40% stent patent LCX 30-40% prox OM-1 stent patent RCA 40% doing well.  - stable no s/s ischemia - continue ASA, statin, plavix and BB - Lipids followed by Dr. Olena Heckle Goal LDL < 70  2) Chronic systolic HF: ICM, EF 12-75% (11/2012) s/p ICD Echo 3/17 EF 35-40% - Echo 8/19 EF 25-30% - Doing well. Stable NYHA I -  early II  - Volume status ok  - Continue carvedilol at 12.5 twice a day. HR too low to adjust.  - Candesartan 16 mg BID at goal and eplerenone 25 mg goal. Not candidate for Entresto due to angioedema with ACE-I - Continue Farxiga 10 - Can change lasix to PRN - Follows with Dr. Caryl Comes for ICD. Had gen change in 8/19  3. HTN - BP soft but stable  Glori Bickers MD  11:12 AM

## 2019-09-05 NOTE — Addendum Note (Signed)
Encounter addended by: Malena Edman, RN on: 09/05/2019 11:32 AM  Actions taken: Order list changed, Diagnosis association updated, Clinical Note Signed

## 2019-09-05 NOTE — Patient Instructions (Signed)
Please call our office in 1 year to schedule your follow up appointment  Your physician has requested that you have an echocardiogram. Echocardiography is a painless test that uses sound waves to create images of your heart. It provides your doctor with information about the size and shape of your heart and how well your heart's chambers and valves are working. This procedure takes approximately one hour. There are no restrictions for this procedure.   If you have any questions or concerns before your next appointment please send [place] a message through [place] or call our office at [phone].    TO LEAVE A MESSAGE FOR THE NURSE SELECT OPTION 2, PLEASE LEAVE A MESSAGE INCLUDING: . YOUR NAME . DATE OF BIRTH . CALL BACK NUMBER . REASON FOR CALL**this is important as we prioritize the call backs  [place] AS LONG AS YOU CALL BEFORE 4:00 PM  At the [facility], you and your health needs are our priority. As part of our continuing mission to provide you with exceptional heart care, we have created designated Provider Care Teams. These Care Teams include your primary Cardiologist (physician) and Advanced Practice Providers (APPs- Physician Assistants and Nurse Practitioners) who all work together to provide you with the care you need, when you need it.   You may see any of the following providers on your designated Care Team at your next follow up: Marland Kitchen Dr Glori Bickers . Dr Loralie Champagne . Darrick Grinder, NP . Lyda Jester, PA . Audry Riles, PharmD   Please be sure to bring in all your medications bottles to every appointment.

## 2019-09-09 ENCOUNTER — Other Ambulatory Visit (HOSPITAL_COMMUNITY): Payer: Self-pay | Admitting: Internal Medicine

## 2019-09-15 ENCOUNTER — Other Ambulatory Visit (HOSPITAL_COMMUNITY): Payer: Self-pay | Admitting: Internal Medicine

## 2019-09-15 ENCOUNTER — Other Ambulatory Visit (HOSPITAL_COMMUNITY): Payer: Self-pay | Admitting: Cardiology

## 2019-09-16 ENCOUNTER — Other Ambulatory Visit: Payer: Self-pay

## 2019-09-16 ENCOUNTER — Ambulatory Visit (HOSPITAL_COMMUNITY)
Admission: RE | Admit: 2019-09-16 | Discharge: 2019-09-16 | Disposition: A | Discharge status: Home / Self Care | Payer: BC Managed Care – PPO | Source: Ambulatory Visit | Attending: Internal Medicine | Admitting: Internal Medicine

## 2019-09-16 DIAGNOSIS — I5022 Chronic systolic (congestive) heart failure: Secondary | ICD-10-CM

## 2019-09-16 DIAGNOSIS — E785 Hyperlipidemia, unspecified: Secondary | ICD-10-CM | POA: Diagnosis not present

## 2019-09-16 DIAGNOSIS — I2510 Atherosclerotic heart disease of native coronary artery without angina pectoris: Secondary | ICD-10-CM | POA: Insufficient documentation

## 2019-09-16 LAB — ECHOCARDIOGRAM COMPLETE
Area-P 1/2: 2.99 cm2
Calc EF: 42.2 %
S' Lateral: 4.6 cm
Single Plane A2C EF: 39.1 %
Single Plane A4C EF: 43.9 %

## 2019-09-16 NOTE — Progress Notes (Signed)
  Echocardiogram 2D Echocardiogram has been performed.  Jennette Dubin 09/16/2019, 10:10 AM

## 2019-10-15 ENCOUNTER — Encounter: Payer: Self-pay | Admitting: Internal Medicine

## 2019-11-06 ENCOUNTER — Ambulatory Visit: Payer: BC Managed Care – PPO | Admitting: Internal Medicine

## 2019-11-07 ENCOUNTER — Ambulatory Visit (INDEPENDENT_AMBULATORY_CARE_PROVIDER_SITE_OTHER): Payer: BC Managed Care – PPO

## 2019-11-07 DIAGNOSIS — I429 Cardiomyopathy, unspecified: Secondary | ICD-10-CM

## 2019-11-07 LAB — CUP PACEART REMOTE DEVICE CHECK
Battery Remaining Longevity: 79 mo
Battery Remaining Percentage: 75 %
Battery Voltage: 2.98 V
Brady Statistic AP VP Percent: 1 %
Brady Statistic AP VS Percent: 1 %
Brady Statistic AS VP Percent: 1 %
Brady Statistic AS VS Percent: 99 %
Brady Statistic RA Percent Paced: 1 %
Brady Statistic RV Percent Paced: 1 %
Date Time Interrogation Session: 20211104015023
HighPow Impedance: 45 Ohm
HighPow Impedance: 45 Ohm
Implantable Lead Implant Date: 20190816
Implantable Lead Implant Date: 20190816
Implantable Lead Location: 753862
Implantable Lead Location: 753862
Implantable Lead Model: 185
Implantable Lead Serial Number: 348730
Implantable Pulse Generator Implant Date: 20190816
Lead Channel Impedance Value: 340 Ohm
Lead Channel Impedance Value: 390 Ohm
Lead Channel Pacing Threshold Amplitude: 0.75 V
Lead Channel Pacing Threshold Amplitude: 1.25 V
Lead Channel Pacing Threshold Pulse Width: 0.5 ms
Lead Channel Pacing Threshold Pulse Width: 0.5 ms
Lead Channel Sensing Intrinsic Amplitude: 11 mV
Lead Channel Sensing Intrinsic Amplitude: 5 mV
Lead Channel Setting Pacing Amplitude: 2 V
Lead Channel Setting Pacing Amplitude: 2.5 V
Lead Channel Setting Pacing Pulse Width: 0.5 ms
Lead Channel Setting Sensing Sensitivity: 0.5 mV
Pulse Gen Serial Number: 9763545

## 2019-11-07 NOTE — Progress Notes (Signed)
Remote ICD transmission.   

## 2019-12-09 ENCOUNTER — Other Ambulatory Visit: Payer: Self-pay

## 2019-12-09 ENCOUNTER — Ambulatory Visit: Payer: BC Managed Care – PPO | Admitting: Internal Medicine

## 2019-12-09 ENCOUNTER — Telehealth: Payer: Self-pay

## 2019-12-09 ENCOUNTER — Encounter: Payer: Self-pay | Admitting: Internal Medicine

## 2019-12-09 VITALS — BP 108/71 | HR 55 | Temp 96.6°F | Ht 72.0 in | Wt 218.6 lb

## 2019-12-09 DIAGNOSIS — Z1211 Encounter for screening for malignant neoplasm of colon: Secondary | ICD-10-CM | POA: Diagnosis not present

## 2019-12-09 DIAGNOSIS — R112 Nausea with vomiting, unspecified: Secondary | ICD-10-CM | POA: Diagnosis not present

## 2019-12-09 DIAGNOSIS — R1319 Other dysphagia: Secondary | ICD-10-CM

## 2019-12-09 DIAGNOSIS — R1310 Dysphagia, unspecified: Secondary | ICD-10-CM | POA: Insufficient documentation

## 2019-12-09 MED ORDER — PANTOPRAZOLE SODIUM 40 MG PO TBEC: 40.0000 mg | DELAYED_RELEASE_TABLET | Freq: Every day | ORAL | 5 refills | Status: DC

## 2019-12-09 NOTE — Patient Instructions (Signed)
We will schedule you for EGD to further evaluate your difficulty swallowing and epigastric pain.  We may perform esophageal dilation at that time pending endoscopic evaluation.  I am going to start you on a new medication called pantoprazole 40 mg want you to take this 30 minutes before breakfast.  At the same time of your EGD, we will perform colonoscopy for colon cancer screening purposes.  Further recommendations to follow.  At [facility] we value your feedback. You may receive a survey about your visit today. Please share your experience as we strive to create trusting relationships with our patients to provide genuine, compassionate, quality care.  We appreciate your understanding and patience as we review any laboratory studies, imaging, and other diagnostic tests that are ordered as we care for you. Our office policy is 5 business days for review of these results, and any emergent or urgent results are addressed in a timely manner for your best interest. If you do not hear from our office in 1 week, please contact us.   We also encourage the use of MyChart, which contains your medical information for your review as well. If you are not enrolled in this feature, an access code is on this after visit summary for your convenience. Thank you for allowing [place] to be involved in your care.  It was great to see you today!  I hope you have a great rest of your winter!!    Elon Alas. Abbey Chatters, D.O. Gastroenterology and Hepatology [facility]

## 2019-12-09 NOTE — Telephone Encounter (Signed)
Called pt, TCS/EGD/DIL ASA 3 scheduled w/Dr. Abbey Chatters for 01/28/20 at 10:45a. Orders entered.

## 2019-12-09 NOTE — Telephone Encounter (Signed)
Pre-op/covid test 01/24/20. Appt letter mailed with procedure instructions.

## 2019-12-09 NOTE — Progress Notes (Signed)
Primary Care Physician:  Caryl Bis, MD Primary Gastroenterologist:  Dr. Abbey Chatters  Chief Complaint  Patient presents with  . Dysphagia    feels like certain foods causes big bubble in chest and is painfule  . Nausea    w/ vomting, comes/goes  . alpha gal    + via blood test.    HPI:   Derrick Lane is a 59 y.o. male who presents to the clinic today by referral from his PCP Dr. Quillian Quince for evaluation.  Patient states he has had dysphagia primarily with solids but also liquids for extended period time now.  Feels as though food will get stuck in his substernal region.  Notes feeling of a "gas bubble" in the region as well.  Has occasional reflux for which he takes Pepcid at night.  No chronic NSAID use.  No recent antibiotic use.  No history of diabetes.  Denies any melena hematochezia.  No unintentional weight loss.  No previous colon cancer screening modality.  Past Medical History:  Diagnosis Date  . AICD (automatic cardioverter/defibrillator) present    [employer] and defibrillator  . BCC (basal cell carcinoma of skin) 06/07/2006   Left Ear (MOH's)  . BCC (basal cell carcinoma of skin) 03/27/2019   RIGHT FOREARM TX CX3 CAUTERY  AND EXC  . BCC (basal cell carcinoma of skin) Ulcerated 10/17/2012   Right Cheek Lateral (Dr. Tarri Glenn)  . CAD (coronary artery disease)    s/p Anterior MI 5/11. LAD stent  . Cancer ([place])    skin cancer  . Chronic kidney disease, stage III (moderate) (HCC)   . Chronic systolic heart failure (HCC)    EF 10-15%.   . Dual implantable cardiac defibrillator    [employer]-Analyze ST study  . GERD (gastroesophageal reflux disease)   . History of kidney stones   . HLD (hyperlipidemia)   . Myocardial infarction ([place]) 05/03/2009   had "widow-maker".  . Nodular basal cell carcinoma (BCC) 10/16/2013   Right Neck (Dr. Tarri Glenn)  . Nodular basal cell carcinoma (BCC) 03/25/2015   Center Forehead (Dr. Tarri Glenn)  . Nodular basal cell carcinoma (BCC)  08/16/2016   Left Preauricular, and Right Neck (curet and 5FU)  . Nodulo-ulcerative basal cell carcinoma (BCC) 08/16/2016   Right Scalp (MOH's)  . Presence of permanent cardiac pacemaker   . SCC (squamous cell carcinoma) 03/27/2019   left posterior neck-[id number]  . SCC (squamous cell carcinoma) 03/27/2019   LEFT TEMPLE  cx3 excision  . SCC (squamous cell carcinoma) Well Diff 03/07/2018   Crown Scalp, and Right Cheek (curet and 5FU)  . 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 Tarri Glenn)  . 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 Tarri Glenn)  . Squamous cell carcinoma of skin 03/25/2015   in situ-right cheek (Dr beavers)  . Squamous cell carcinoma of skin 08/30/2018   well diff-Left cheek  . Superficial basal cell carcinoma (BCC) 10/17/2012   Left Upper Back (Dr. Tarri Glenn)    Past Surgical History:  Procedure Laterality Date  . CARDIAC CATHETERIZATION     stents inserted X3  . CHOLECYSTECTOMY N/A 12/20/2018   Procedure: LAPAROSCOPIC CHOLECYSTECTOMY;  Surgeon: Virl Cagey, MD;  Location: [facility];  Service: General;  Laterality: N/A;  . CYSTOSCOPY WITH RETROGRADE PYELOGRAM, URETEROSCOPY AND STENT PLACEMENT Right 06/07/2017   Procedure: CYSTOSCOPY WITH RETROGRADE  PYELOGRAM, URETEROSCOPY AND STENT PLACEMENT HOLMIUM LASER;  Surgeon: Cleon Gustin, MD;  Location: [facility];  Service: Urology;  Laterality: Right;  . HERNIA REPAIR Bilateral    Inguinal  . ICD GENERATOR CHANGEOUT N/A 08/18/2017   Procedure: ICD GENERATOR CHANGEOUT;  Surgeon: Deboraha Sprang, MD;  Location: [facility];  Service: Cardiovascular;  Laterality: N/A;  . INSERT / REPLACE / REMOVE PACEMAKER     ICD    Current Outpatient Medications  Medication Sig Dispense Refill  . acetaminophen (TYLENOL) 500 MG tablet Take 1,000 mg by mouth every 6 (six) hours as needed for  moderate pain or headache.     Marland Kitchen aspirin 81 MG tablet Take 81 mg by mouth at bedtime.     . candesartan (ATACAND) 8 MG tablet TAKE 2 TABLETS BY MOUTH 2 TIMES DAILY. 360 tablet 1  . carvedilol (COREG) 12.5 MG tablet TAKE 1 TABLET (12.5 MG TOTAL) BY MOUTH 2 (TWO) TIMES DAILY. 180 tablet 3  . clopidogrel (PLAVIX) 75 MG tablet TAKE 1 TABLET BY MOUTH ONCE DAILY 90 tablet 3  . eplerenone (INSPRA) 25 MG tablet TAKE 1 TABLET BY MOUTH EVERY DAY 90 tablet 3  . famotidine (PEPCID) 20 MG tablet Take 20 mg by mouth every evening.    Marland Kitchen FARXIGA 10 MG TABS tablet TAKE 10 MG BY MOUTH DAILY BEFORE BREAKFAST. 30 tablet 11  . fexofenadine (ALLEGRA) 180 MG tablet Take 180 mg by mouth at bedtime.    . furosemide (LASIX) 20 MG tablet Take 1 tablet (20 mg total) by mouth daily. 90 tablet 3  . ondansetron (ZOFRAN-ODT) 4 MG disintegrating tablet Take 4 mg by mouth every 8 (eight) hours as needed for nausea or vomiting.    . predniSONE (DELTASONE) 20 MG tablet Take 20 mg by mouth daily as needed (for beef and pork allergies).   1  . rosuvastatin (CRESTOR) 10 MG tablet Take 10 mg by mouth every evening.   3  . sildenafil (REVATIO) 20 MG tablet TAKE 1 TABLET (20 MG TOTAL) BY MOUTH DAILY AS NEEDED (FOR ED). 10 tablet 2   No current facility-administered medications for this visit.    Allergies as of 12/09/2019 - Review Complete 12/09/2019  Allergen Reaction Noted  . Beef-derived products Hives and Swelling 05/04/2011  . Peach flavor Other (See Comments) 11/22/2012  . Pork-derived products Hives and Swelling 05/04/2011  . Ramipril Swelling and Hives 04/15/2010  . Spironolactone Other (See Comments) 04/26/2012    Family History  Problem Relation Age of Onset  . Heart attack Mother   . Hyperlipidemia Mother   . Hypertension Mother   . Heart failure Father   . Heart attack Father   . Hypertension Father   . Coronary artery disease Other     Social History   Socioeconomic History  . Marital status: Married     Spouse name: Not on file  . Number of children: Not on file  . Years of education: Not on file  . Highest education level: Not on file  Occupational History  . Not on file  Tobacco Use  . Smoking status: Never Smoker  . Smokeless tobacco: Never Used  Vaping Use  . Vaping Use: Never used  Substance and Sexual Activity  . Alcohol use: No  . Drug use: No  . Sexual activity: Yes    Birth control/protection: None  Other Topics Concern  . Not on file  Social History Narrative  . Not on file   Social  Determinants of Health   Financial Resource Strain:   . Difficulty of Paying Living Expenses: Not on file  Food Insecurity:   . Worried About Charity fundraiser in the Last Year: Not on file  . Ran Out of Food in the Last Year: Not on file  Transportation Needs:   . Lack of Transportation (Medical): Not on file  . Lack of Transportation (Non-Medical): Not on file  Physical Activity:   . Days of Exercise per Week: Not on file  . Minutes of Exercise per Session: Not on file  Stress:   . Feeling of Stress : Not on file  Social Connections:   . Frequency of Communication with Friends and Family: Not on file  . Frequency of Social Gatherings with Friends and Family: Not on file  . Attends Religious Services: Not on file  . Active Member of Clubs or Organizations: Not on file  . Attends Archivist Meetings: Not on file  . Marital Status: Not on file  Intimate Partner Violence:   . Fear of Current or Ex-Partner: Not on file  . Emotionally Abused: Not on file  . Physically Abused: Not on file  . Sexually Abused: Not on file    Subjective: Review of Systems  Constitutional: Negative for chills and fever.  HENT: Negative for congestion and hearing loss.   Eyes: Negative for blurred vision and double vision.  Respiratory: Negative for cough and shortness of breath.   Cardiovascular: Negative for chest pain and palpitations.  Gastrointestinal: Negative for abdominal pain,  blood in stool, constipation, diarrhea, heartburn, melena and vomiting.       Dysphagia  Genitourinary: Negative for dysuria and urgency.  Musculoskeletal: Negative for joint pain and myalgias.  Skin: Negative for itching and rash.  Neurological: Negative for dizziness and headaches.  Psychiatric/Behavioral: Negative for depression. The patient is not nervous/anxious.        Objective: BP 108/71   Pulse (!) 55   Temp (!) 96.6 F (35.9 C)   Ht 6' (1.829 m)   Wt 218 lb 9.6 oz (99.2 kg)   BMI 29.65 kg/m  Physical Exam Constitutional:      Appearance: Normal appearance.  HENT:     Head: Normocephalic and atraumatic.  Eyes:     Extraocular Movements: Extraocular movements intact.     Conjunctiva/sclera: Conjunctivae normal.  Cardiovascular:     Rate and Rhythm: Normal rate and regular rhythm.  Pulmonary:     Effort: Pulmonary effort is normal.     Breath sounds: Normal breath sounds.  Abdominal:     General: Bowel sounds are normal.     Palpations: Abdomen is soft.  Musculoskeletal:        General: Normal range of motion.     Cervical back: Normal range of motion and neck supple.  Skin:    General: Skin is warm.  Neurological:     General: No focal deficit present.     Mental Status: He is alert and oriented to person, place, and time.  Psychiatric:        Mood and Affect: Mood normal.        Behavior: Behavior normal.      Assessment: *Dysphagia *Nausea and vomiting-intermittent  *Colon cancer screening  Plan: Will schedule for EGD to evaluate for peptic ulcer disease, esophagitis, gastritis, H. Pylori, duodenitis, or other. Will also evaluate for esophageal stricture, Schatzki's ring, esophageal web or other.   At the same time we will perform colonoscopy  for colon cancer screening purposes.  The risks including infection, bleed, or perforation as well as benefits, limitations, alternatives and imponderables have been reviewed with the patient. Potential for  esophageal dilation, biopsy, etc. have also been reviewed.  Questions have been answered. All parties agreeable.  I will start patient on pantoprazole 40 mg daily.  We will reach out to patient's cardiologist in hopes of holding patient's Plavix for 5 days prior to above procedures.  Thank you Dr. Quillian Quince for the kind referral  12/09/2019 12:03 PM   Disclaimer: This note was dictated with voice recognition software. Similar sounding words can inadvertently be transcribed and may not be corrected upon review.

## 2019-12-10 ENCOUNTER — Other Ambulatory Visit: Payer: Self-pay

## 2019-12-10 ENCOUNTER — Telehealth: Payer: Self-pay

## 2019-12-10 NOTE — Telephone Encounter (Signed)
Dr. Candee Furbish,  We have a mutual patient that needs cardiac clearance to hold his Plavix for 5 days. This patient will be scheduled to have a EGD Dilation and CLN Screening ASA lll. Is it ok for the patient to do this.

## 2019-12-11 ENCOUNTER — Telehealth: Payer: Self-pay

## 2019-12-11 NOTE — Telephone Encounter (Signed)
Noted. Instructions to hold Plavix are on procedure instructions that were mailed to pt.

## 2019-12-11 NOTE — Telephone Encounter (Signed)
PT HAS CARDIAC CLEARANCE FROM DR.BENSIMHON. ROUTED TO [facility] TO SCHEDULE.

## 2019-12-11 NOTE — Telephone Encounter (Signed)
Ok to hold

## 2019-12-11 NOTE — Telephone Encounter (Signed)
1. NOTED. ROUTING TO [place]  2. PT HAS CARDIAC CLEARANCE FROM DR. Haroldine Laws.

## 2019-12-12 ENCOUNTER — Ambulatory Visit: Payer: BC Managed Care – PPO | Admitting: Internal Medicine

## 2020-01-14 ENCOUNTER — Other Ambulatory Visit (HOSPITAL_COMMUNITY): Payer: Self-pay | Admitting: Cardiology

## 2020-01-23 ENCOUNTER — Telehealth: Payer: Self-pay | Admitting: Internal Medicine

## 2020-01-23 ENCOUNTER — Other Ambulatory Visit: Payer: Self-pay

## 2020-01-23 ENCOUNTER — Other Ambulatory Visit (HOSPITAL_COMMUNITY)
Admission: RE | Admit: 2020-01-23 | Discharge: 2020-01-23 | Disposition: A | Discharge status: Home / Self Care | Payer: BC Managed Care – PPO | Source: Ambulatory Visit | Attending: Internal Medicine | Admitting: Internal Medicine

## 2020-01-23 ENCOUNTER — Encounter (HOSPITAL_COMMUNITY)
Admission: RE | Admit: 2020-01-23 | Discharge: 2020-01-23 | Disposition: A | Discharge status: Home / Self Care | Payer: BC Managed Care – PPO | Source: Ambulatory Visit | Attending: Internal Medicine | Admitting: Internal Medicine

## 2020-01-23 ENCOUNTER — Encounter (HOSPITAL_COMMUNITY): Payer: Self-pay

## 2020-01-23 DIAGNOSIS — Z01812 Encounter for preprocedural laboratory examination: Secondary | ICD-10-CM | POA: Insufficient documentation

## 2020-01-23 LAB — BASIC METABOLIC PANEL
Anion gap: 8 (ref 5–15)
BUN: 30 mg/dL — ABNORMAL HIGH (ref 6–20)
CO2: 24 mmol/L (ref 22–32)
Calcium: 9.5 mg/dL (ref 8.9–10.3)
Chloride: 105 mmol/L (ref 98–111)
Creatinine, Ser: 1.81 mg/dL — ABNORMAL HIGH (ref 0.61–1.24)
GFR, Estimated: 43 mL/min — ABNORMAL LOW (ref >60)
Glucose, Bld: 164 mg/dL — ABNORMAL HIGH (ref 70–99)
Potassium: 4.4 mmol/L (ref 3.5–5.1)
Sodium: 137 mmol/L (ref 135–145)

## 2020-01-23 NOTE — Telephone Encounter (Signed)
[phone] patient called with questions about his prep. Wants to know if he can start at 5pm instead of 10am

## 2020-01-23 NOTE — Patient Instructions (Signed)
Your procedure is scheduled on: 01/28/2020  Report to [facility] at   8:45  AM.  Call this number if you have problems the morning of surgery: [phone]   Remember:              Follow Directions on the letter you received from Your Physician's office regarding the Bowel Prep              No Smoking the day of Procedure :   Take these medicines the morning of surgery with A SIP OF WATER: Carvedilol, Zyrtec, Pantoprazole, and Farxiga   Do not wear jewelry, make-up or nail polish.    Do not bring valuables to the hospital.  Contacts, dentures or bridgework may not be worn into surgery.  .   Patients discharged the day of surgery will not be allowed to drive home.     Colonoscopy, Adult, Care After This sheet gives you information about how to care for yourself after your procedure. Your health care provider may also give you more specific instructions. If you have problems or questions, contact your health care provider. What can I expect after the procedure? After the procedure, it is common to have:  A small amount of blood in your stool for 24 hours after the procedure.  Some gas.  Mild abdominal cramping or bloating.  Follow these instructions at home: General instructions   For the first 24 hours after the procedure: ? Do not drive or use machinery. ? Do not sign important documents. ? Do not drink alcohol. ? Do your regular daily activities at a slower pace than normal. ? Eat soft, easy-to-digest foods. ? Rest often.  Take over-the-counter or prescription medicines only as told by your health care provider.  It is up to you to get the results of your procedure. Ask your health care provider, or the department performing the procedure, when your results will be ready. Relieving cramping and bloating  Try walking around when you have cramps or feel bloated.  Apply heat to your abdomen as told by your health care provider. Use a heat source that your health  care provider recommends, such as a moist heat pack or a heating pad. ? Place a towel between your skin and the heat source. ? Leave the heat on for 20-30 minutes. ? Remove the heat if your skin turns bright red. This is especially important if you are unable to feel pain, heat, or cold. You may have a greater risk of getting burned. Eating and drinking  Drink enough fluid to keep your urine clear or pale yellow.  Resume your normal diet as instructed by your health care provider. Avoid heavy or fried foods that are hard to digest.  Avoid drinking alcohol for as long as instructed by your health care provider. Contact a health care provider if:  You have blood in your stool 2-3 days after the procedure. Get help right away if:  You have more than a small spotting of blood in your stool.  You pass large blood clots in your stool.  Your abdomen is swollen.  You have nausea or vomiting.  You have a fever.  You have increasing abdominal pain that is not relieved with medicine. This information is not intended to replace advice given to you by your health care provider. Make sure you discuss any questions you have with your health care provider. Document Released: 08/04/2003 Document Revised: 09/14/2015 Document Reviewed: 03/03/2015 Elsevier Interactive Patient Education  2018 [employer].  Upper Endoscopy, Adult, Care After This sheet gives you information about how to care for yourself after your procedure. Your health care provider may also give you more specific instructions. If you have problems or questions, contact your health care provider. What can I expect after the procedure? After the procedure, it is common to have:  A sore throat.  Mild stomach pain or discomfort.  Bloating.  Nausea. Follow these instructions at home:  Follow instructions from your health care provider about what to eat or drink after your procedure.  Return to your normal activities as told  by your health care provider. Ask your health care provider what activities are safe for you.  Take over-the-counter and prescription medicines only as told by your health care provider.  If you were given a sedative during the procedure, it can affect you for several hours. Do not drive or operate machinery until your health care provider says that it is safe.  Keep all follow-up visits as told by your health care provider. This is important.   Contact a health care provider if you have:  A sore throat that lasts longer than one day.  Trouble swallowing. Get help right away if:  You vomit blood or your vomit looks like coffee grounds.  You have: ? A fever. ? Bloody, black, or tarry stools. ? A severe sore throat or you cannot swallow. ? Difficulty breathing. ? Severe pain in your chest or abdomen. Summary  After the procedure, it is common to have a sore throat, mild stomach discomfort, bloating, and nausea.  If you were given a sedative during the procedure, it can affect you for several hours. Do not drive or operate machinery until your health care provider says that it is safe.  Follow instructions from your health care provider about what to eat or drink after your procedure.  Return to your normal activities as told by your health care provider. This information is not intended to replace advice given to you by your health care provider. Make sure you discuss any questions you have with your health care provider. Document Revised: 12/18/2018 Document Reviewed: 05/22/2017 Elsevier Patient Education  2021 [employer].  https://www.asge.org/home/for-patients/patient-information/understanding-eso-dilation-updated">  Esophageal Dilatation Esophageal dilatation, also called esophageal dilation, is a procedure to widen or open a blocked or narrowed part of the esophagus. The esophagus is the part of the body that moves food and liquid from the mouth to the stomach. You may need  this procedure if:  You have a buildup of scar tissue in your esophagus that makes it difficult, painful, or impossible to swallow. This can be caused by gastroesophageal reflux disease (GERD).  You have cancer of the esophagus.  There is a problem with how food moves through your esophagus. In some cases, you may need this procedure repeated at a later time to dilate the esophagus gradually. Tell a health care provider about:  Any allergies you have.  All medicines you are taking, including vitamins, herbs, eye drops, creams, and over-the-counter medicines.  Any problems you or family members have had with anesthetic medicines.  Any blood disorders you have.  Any surgeries you have had.  Any medical conditions you have.  Any antibiotic medicines you are required to take before dental procedures.  Whether you are pregnant or may be pregnant. What are the risks? Generally, this is a safe procedure. However, problems may occur, including:  Bleeding due to a tear in the lining of the esophagus.  A hole, or perforation, in the esophagus. What happens before the procedure?  Ask your health care provider about: ? Changing or stopping your regular medicines. This is especially important if you are taking diabetes medicines or blood thinners. ? Taking medicines such as aspirin and ibuprofen. These medicines can thin your blood. Do not take these medicines unless your health care provider tells you to take them. ? Taking over-the-counter medicines, vitamins, herbs, and supplements.  Follow instructions from your health care provider about eating or drinking restrictions.  Plan to have a responsible adult take you home from the hospital or clinic.  Plan to have a responsible adult care for you for the time you are told after you leave the hospital or clinic. This is important. What happens during the procedure?  You may be given a medicine to help you relax (sedative).  A numbing  medicine may be sprayed into the back of your throat, or you may gargle the medicine.  Your health care provider may perform the dilatation using various surgical instruments, such as: ? Simple dilators. This instrument is carefully placed in the esophagus to stretch it. ? Guided wire bougies. This involves using an endoscope to insert a wire into the esophagus. A dilator is passed over this wire to enlarge the esophagus. Then the wire is removed. ? Balloon dilators. An endoscope with a small balloon is inserted into the esophagus. The balloon is inflated to stretch the esophagus and open it up. The procedure may vary among health care providers and hospitals. What can I expect after the procedure?  Your blood pressure, heart rate, breathing rate, and blood oxygen level will be monitored until you leave the hospital or clinic.  Your throat may feel slightly sore and numb. This will get better over time.  You will not be allowed to eat or drink until your throat is no longer numb.  When you are able to drink, urinate, and sit on the edge of the bed without nausea or dizziness, you may be able to return home. Follow these instructions at home:  Take over-the-counter and prescription medicines only as told by your health care provider.  If you were given a sedative during the procedure, it can affect you for several hours. Do not drive or operate machinery until your health care provider says that it is safe.  Plan to have a responsible adult care for you for the time you are told. This is important.  Follow instructions from your health care provider about any eating or drinking restrictions.  Do not use any products that contain nicotine or tobacco, such as cigarettes, e-cigarettes, and chewing tobacco. If you need help quitting, ask your health care provider.  Keep all follow-up visits. This is important. Contact a health care provider if:  You have a fever.  You have pain that is  not relieved by medicine. Get help right away if:  You have chest pain.  You have trouble breathing.  You have trouble swallowing.  You vomit blood.  You have black, tarry, or bloody stools. These symptoms may represent a serious problem that is an emergency. Do not wait to see if the symptoms will go away. Get medical help right away. Call your local emergency services (911 in the [place] Do not drive yourself to the hospital. Summary  Esophageal dilatation, also called esophageal dilation, is a procedure to widen or open a blocked or narrowed part of the esophagus.  Plan to have a responsible adult  take you home from the hospital or clinic.  For this procedure, a numbing medicine may be sprayed into the back of your throat, or you may gargle the medicine.  Do not drive or operate machinery until your health care provider says that it is safe. This information is not intended to replace advice given to you by your health care provider. Make sure you discuss any questions you have with your health care provider. Document Revised: 05/08/2019 Document Reviewed: 05/08/2019 Elsevier Patient Education  [employer].

## 2020-01-23 NOTE — Telephone Encounter (Signed)
Called pt. Discussed prep instructions in detail and advised he needed to follow the times on his instructions to ensure proper clean out in time. He voiced understanding.

## 2020-01-24 ENCOUNTER — Encounter (HOSPITAL_COMMUNITY): Admission: RE | Admit: 2020-01-24 | Payer: BC Managed Care – PPO | Source: Ambulatory Visit

## 2020-01-24 ENCOUNTER — Other Ambulatory Visit (HOSPITAL_COMMUNITY): Payer: BC Managed Care – PPO

## 2020-01-28 ENCOUNTER — Ambulatory Visit (HOSPITAL_COMMUNITY)
Admission: RE | Admit: 2020-01-28 | Discharge: 2020-01-28 | Disposition: A | Discharge status: Home / Self Care | Payer: BC Managed Care – PPO | Attending: Internal Medicine | Admitting: Internal Medicine

## 2020-01-28 ENCOUNTER — Telehealth: Payer: Self-pay

## 2020-01-28 ENCOUNTER — Other Ambulatory Visit: Payer: Self-pay

## 2020-01-28 ENCOUNTER — Ambulatory Visit (HOSPITAL_COMMUNITY): Payer: BC Managed Care – PPO | Admitting: Anesthesiology

## 2020-01-28 ENCOUNTER — Encounter (HOSPITAL_COMMUNITY)
Admission: RE | Disposition: A | Discharge status: Home / Self Care | Payer: Self-pay | Source: Home / Self Care | Attending: Internal Medicine

## 2020-01-28 ENCOUNTER — Encounter (HOSPITAL_COMMUNITY): Payer: Self-pay

## 2020-01-28 DIAGNOSIS — K5730 Diverticulosis of large intestine without perforation or abscess without bleeding: Secondary | ICD-10-CM | POA: Insufficient documentation

## 2020-01-28 DIAGNOSIS — K222 Esophageal obstruction: Secondary | ICD-10-CM | POA: Insufficient documentation

## 2020-01-28 DIAGNOSIS — Z950 Presence of cardiac pacemaker: Secondary | ICD-10-CM | POA: Insufficient documentation

## 2020-01-28 DIAGNOSIS — Z888 Allergy status to other drugs, medicaments and biological substances status: Secondary | ICD-10-CM | POA: Insufficient documentation

## 2020-01-28 DIAGNOSIS — Z7902 Long term (current) use of antithrombotics/antiplatelets: Secondary | ICD-10-CM | POA: Diagnosis not present

## 2020-01-28 DIAGNOSIS — Z85828 Personal history of other malignant neoplasm of skin: Secondary | ICD-10-CM | POA: Insufficient documentation

## 2020-01-28 DIAGNOSIS — Z7982 Long term (current) use of aspirin: Secondary | ICD-10-CM | POA: Diagnosis not present

## 2020-01-28 DIAGNOSIS — K2289 Other specified disease of esophagus: Secondary | ICD-10-CM | POA: Diagnosis not present

## 2020-01-28 DIAGNOSIS — K2970 Gastritis, unspecified, without bleeding: Secondary | ICD-10-CM | POA: Insufficient documentation

## 2020-01-28 DIAGNOSIS — R1310 Dysphagia, unspecified: Secondary | ICD-10-CM | POA: Diagnosis not present

## 2020-01-28 DIAGNOSIS — Z1211 Encounter for screening for malignant neoplasm of colon: Secondary | ICD-10-CM

## 2020-01-28 DIAGNOSIS — Z7984 Long term (current) use of oral hypoglycemic drugs: Secondary | ICD-10-CM | POA: Diagnosis not present

## 2020-01-28 DIAGNOSIS — Z955 Presence of coronary angioplasty implant and graft: Secondary | ICD-10-CM | POA: Insufficient documentation

## 2020-01-28 DIAGNOSIS — Z79899 Other long term (current) drug therapy: Secondary | ICD-10-CM | POA: Insufficient documentation

## 2020-01-28 DIAGNOSIS — K648 Other hemorrhoids: Secondary | ICD-10-CM | POA: Diagnosis not present

## 2020-01-28 DIAGNOSIS — Z9049 Acquired absence of other specified parts of digestive tract: Secondary | ICD-10-CM | POA: Insufficient documentation

## 2020-01-28 HISTORY — PX: COLONOSCOPY WITH PROPOFOL: SHX5780

## 2020-01-28 HISTORY — PX: BALLOON DILATION: SHX5330

## 2020-01-28 HISTORY — PX: ESOPHAGOGASTRODUODENOSCOPY (EGD) WITH PROPOFOL: SHX5813

## 2020-01-28 SURGERY — COLONOSCOPY WITH PROPOFOL
Anesthesia: General

## 2020-01-28 MED ORDER — LIDOCAINE HCL (CARDIAC) PF 100 MG/5ML IV SOSY
PREFILLED_SYRINGE | INTRAVENOUS | Status: DC | PRN
  Administered 2020-01-28: 40 mg via INTRAVENOUS

## 2020-01-28 MED ORDER — LACTATED RINGERS IV SOLN: INTRAVENOUS | Status: DC

## 2020-01-28 MED ORDER — EPHEDRINE SULFATE 50 MG/ML IJ SOLN
INTRAMUSCULAR | Status: DC | PRN
  Administered 2020-01-28 (×4): 10 mg via INTRAVENOUS

## 2020-01-28 MED ORDER — PANTOPRAZOLE SODIUM 40 MG PO TBEC: 40.0000 mg | DELAYED_RELEASE_TABLET | Freq: Two times a day (BID) | ORAL | 5 refills | Status: DC

## 2020-01-28 MED ORDER — PROPOFOL 10 MG/ML IV BOLUS
INTRAVENOUS | Status: DC | PRN
  Administered 2020-01-28: 125 ug/kg/min via INTRAVENOUS
  Administered 2020-01-28: 100 mg via INTRAVENOUS

## 2020-01-28 NOTE — Op Note (Signed)
[facility] Patient Name: Yakub Lodes Procedure Date: 01/28/2020 11:10 AM MRN: [id number] Date of Birth: Aug 19, 1960 Attending MD: Elon Alas. Abbey Chatters DO CSN: [id number] Age: 60 Admit Type: Outpatient Procedure:                Upper GI endoscopy Indications:              Dysphagia Providers:                Elon Alas. Abbey Chatters, DO, Janeece Riggers, RN, Nelma Rothman,                            Technician Referring MD:              Medicines:                See the Anesthesia note for documentation of the                            administered medications Complications:            No immediate complications. Estimated Blood Loss:     Estimated blood loss was minimal. Procedure:                Pre-Anesthesia Assessment:                           - The anesthesia plan was to use monitored                            anesthesia care (MAC).                           After obtaining informed consent, the endoscope was                            passed under direct vision. Throughout the                            procedure, the patient's blood pressure, pulse, and                            oxygen saturations were monitored continuously. The                            [id number]-[id number] ([id number]) scope was introduced through the                            mouth, and advanced to the second part of duodenum.                            The upper GI endoscopy was accomplished without                            difficulty. The patient tolerated the procedure                            well. Scope In: 11:25:09 AM Scope Out:  11:34:10 AM Total Procedure Duration: 0 hours 9 minutes 1 second  Findings:      Mucosal changes including ringed esophagus, feline appearance,       longitudinal furrows and white plaques were found in the middle third of       the esophagus and in the lower third of the esophagus. Esophageal       findings were graded using the Eosinophilic Esophagitis Endoscopic       Reference Score  (EoE-EREFS) as: Edema Grade 1 Present (decreased clarity       or absence of vascular markings), Rings Grade 2 Moderate (distinct rings       that do not occlude passage of diagnostic 8-10 mm endoscope), Exudates       Grade 1 Mild (scattered white lesions involving less than 10 percent of       the esophageal surface area), Furrows Grade 1 Present (vertical lines       with or without visible depth) and Stricture present. Biopsies were       taken with a cold forceps for histology.      One benign-appearing, intrinsic moderate stenosis was found in the lower       third of the esophagus. This stenosis measured 1.3 cm (inner diameter).       The stenosis was traversed. A TTS dilator was passed through the scope.       Dilation with a 12-13.5-15 mm balloon dilator was performed to 15 mm.       The dilation site was examined and showed mild mucosal disruption and       moderate improvement in luminal narrowing.      Diffuse moderate inflammation characterized by erosions and erythema was       found in the entire examined stomach. Biopsies were taken with a cold       forceps for Helicobacter pylori testing.      The duodenal bulb, first portion of the duodenum and second portion of       the duodenum were normal. Impression:               - Esophageal mucosal changes consistent with                            eosinophilic esophagitis. Biopsied.                           - Benign-appearing esophageal stenosis. Dilated.                           - Gastritis. Biopsied.                           - Normal duodenal bulb, first portion of the                            duodenum and second portion of the duodenum. Moderate Sedation:      Per Anesthesia Care Recommendation:           - Patient has a contact number available for                            emergencies. The signs and symptoms of potential  delayed complications were discussed with the                             patient. Return to normal activities tomorrow.                            Written discharge instructions were provided to the                            patient.                           - Resume previous diet.                           - Continue present medications.                           - Await pathology results.                           - Repeat upper endoscopy after studies are complete                            for surveillance based on pathology results.                           - Return to GI clinic in 2 months. Procedure Code(s):        --- Professional ---                           [phone], Esophagogastroduodenoscopy, flexible,                            transoral; with transendoscopic balloon dilation of                            esophagus (less than 30 mm diameter)                           [id number], 59, Esophagogastroduodenoscopy, flexible,                            transoral; with biopsy, single or multiple Diagnosis Code(s):        --- Professional ---                           K22.8, Other specified diseases of esophagus                           K22.2, Esophageal obstruction                           K29.70, Gastritis, unspecified, without bleeding                           R13.10, Dysphagia, unspecified CPT copyright 2019 American  Medical Association. All rights reserved. The codes documented in this report are preliminary and upon coder review may  be revised to meet current compliance requirements. Elon Alas. Abbey Chatters, DO [place] Abbey Chatters, DO 01/28/2020 11:37:29 AM This report has been signed electronically. Number of Addenda: 0

## 2020-01-28 NOTE — Anesthesia Postprocedure Evaluation (Signed)
Anesthesia Post Note  Patient: STARLING CHRISTOFFERSON  Procedure(s) Performed: COLONOSCOPY WITH PROPOFOL (N/A ) ESOPHAGOGASTRODUODENOSCOPY (EGD) WITH PROPOFOL (N/A ) BALLOON DILATION (N/A )  Patient location during evaluation: PACU Anesthesia Type: General Level of consciousness: awake and oriented Pain management: satisfactory to patient Vital Signs Assessment: post-procedure vital signs reviewed and stable Respiratory status: spontaneous breathing and respiratory function stable Cardiovascular status: stable Postop Assessment: no apparent nausea or vomiting Anesthetic complications: no   No complications documented.   Last Vitals:  Vitals:   01/28/20 1024  BP: (!) 93/55  Resp: 16  Temp: 36.6 C  SpO2: 97%    Last Pain:  Vitals:   01/28/20 1125  TempSrc:   PainSc: 0-No pain                 Karna Dupes

## 2020-01-28 NOTE — Transfer of Care (Signed)
Immediate Anesthesia Transfer of Care Note  Patient: Derrick Lane  Procedure(s) Performed: COLONOSCOPY WITH PROPOFOL (N/A ) ESOPHAGOGASTRODUODENOSCOPY (EGD) WITH PROPOFOL (N/A ) BALLOON DILATION (N/A )  Patient Location: PACU  Anesthesia Type:General  Level of Consciousness: awake  Airway & Oxygen Therapy: Patient Spontanous Breathing  Post-op Assessment: Report given to RN and Post -op Vital signs reviewed and stable  Post vital signs: Reviewed and stable  Last Vitals:  Vitals Value Taken Time  BP 78/50   Temp    Pulse 49 01/28/20 1152  Resp 13 01/28/20 1152  SpO2 98 % 01/28/20 1152  Vitals shown include unvalidated device data.  Last Pain:  Vitals:   01/28/20 1125  TempSrc:   PainSc: 0-No pain      Patients Stated Pain Goal: 9 (03/47/42 5956)  Complications: No complications documented.

## 2020-01-28 NOTE — Op Note (Signed)
[facility] Patient Name: Derrick Lane Procedure Date: 01/28/2020 11:36 AM MRN: [id number] Date of Birth: [birth year] Attending MD: Elon Alas. Abbey Chatters DO CSN: [id number] Age: 60 Admit Type: Outpatient Procedure:                Colonoscopy Indications:              Screening for colorectal malignant neoplasm Providers:                Elon Alas. Abbey Chatters, DO, Janeece Riggers, RN, Nelma Rothman,                            Technician Referring MD:              Medicines:                See the Anesthesia note for documentation of the                            administered medications Complications:            No immediate complications. Estimated Blood Loss:     Estimated blood loss: none. Procedure:                Pre-Anesthesia Assessment:                           - The anesthesia plan was to use monitored                            anesthesia care (MAC).                           After obtaining informed consent, the colonoscope                            was passed under direct vision. Throughout the                            procedure, the patient's blood pressure, pulse, and                            oxygen saturations were monitored continuously. The                            [id number]-[id number]) was introduced through the anus                            and advanced to the the cecum, identified by                            appendiceal orifice and ileocecal valve. The                            colonoscopy was performed without difficulty. The                            patient tolerated the procedure well. The quality  of the bowel preparation was evaluated using the                            BBPS [facility] Bowel Preparation Scale) with scores                            of: Right Colon = 1 (portion of mucosa seen, but                            other areas not well seen due to staining, residual                            stool and/or opaque liquid),  Transverse Colon = 2                            (minor amount of residual staining, small fragments                            of stool and/or opaque liquid, but mucosa seen                            well) and Left Colon = 2 (minor amount of residual                            staining, small fragments of stool and/or opaque                            liquid, but mucosa seen well). The total BBPS score                            equals 5. The quality of the bowel preparation was                            inadequate. Scope In: 11:38:33 AM Scope Out: 11:47:44 AM Scope Withdrawal Time: 0 hours 6 minutes 54 seconds  Total Procedure Duration: 0 hours 9 minutes 11 seconds  Findings:      The perianal and digital rectal examinations were normal.      Non-bleeding internal hemorrhoids were found during endoscopy.      Multiple small-mouthed diverticula were found in the sigmoid colon,       descending colon and transverse colon.      Extensive amounts of stool was found in the ascending colon and at the       appendiceal orifice, precluding visualization. Lavage of the area was       performed using copious amounts of sterile water, resulting in       incomplete clearance with continued poor visualization. Impression:               - Preparation of the colon was inadequate.                           - Non-bleeding internal hemorrhoids.                           -  Diverticulosis in the sigmoid colon, in the                            descending colon and in the transverse colon.                           - Stool in the ascending colon and at the                            appendiceal orifice.                           - No specimens collected. Moderate Sedation:      Per Anesthesia Care Recommendation:           - Patient has a contact number available for                            emergencies. The signs and symptoms of potential                            delayed complications were  discussed with the                            patient. Return to normal activities tomorrow.                            Written discharge instructions were provided to the                            patient.                           - Resume previous diet.                           - Continue present medications.                           - Repeat colonoscopy in 1 year because the bowel                            preparation was suboptimal.                           - Return to GI clinic in 2 weeks. Procedure Code(s):        --- Professional ---                           T5573, Colorectal cancer screening; colonoscopy on                            individual not meeting criteria for high risk Diagnosis Code(s):        --- Professional ---  Z12.11, Encounter for screening for malignant                            neoplasm of colon                           K64.8, Other hemorrhoids                           K57.30, Diverticulosis of large intestine without                            perforation or abscess without bleeding CPT copyright 2019 American Medical Association. All rights reserved. The codes documented in this report are preliminary and upon coder review may  be revised to meet current compliance requirements. Elon Alas. Abbey Chatters, DO [place] Abbey Chatters, DO 01/28/2020 11:50:55 AM This report has been signed electronically. Number of Addenda: 0

## 2020-01-28 NOTE — Discharge Instructions (Addendum)
EGD Discharge instructions Please read the instructions outlined below and refer to this sheet in the next few weeks. These discharge instructions provide you with general information on caring for yourself after you leave the hospital. Your doctor may also give you specific instructions. While your treatment has been planned according to the most current medical practices available, unavoidable complications occasionally occur. If you have any problems or questions after discharge, please call your doctor. ACTIVITY  You may resume your regular activity but move at a slower pace for the next 24 hours.   Take frequent rest periods for the next 24 hours.   Walking will help expel (get rid of) the air and reduce the bloated feeling in your abdomen.   No driving for 24 hours (because of the anesthesia (medicine) used during the test).   You may shower.   Do not sign any important legal documents or operate any machinery for 24 hours (because of the anesthesia used during the test).  NUTRITION  Drink plenty of fluids.   You may resume your normal diet.   Begin with a light meal and progress to your normal diet.   Avoid alcoholic beverages for 24 hours or as instructed by your caregiver.  MEDICATIONS  You may resume your normal medications unless your caregiver tells you otherwise.  WHAT YOU CAN EXPECT TODAY  You may experience abdominal discomfort such as a feeling of fullness or gas pains.  FOLLOW-UP  Your doctor will discuss the results of your test with you.  SEEK IMMEDIATE MEDICAL ATTENTION IF ANY OF THE FOLLOWING OCCUR:  Excessive nausea (feeling sick to your stomach) and/or vomiting.   Severe abdominal pain and distention (swelling).   Trouble swallowing.   Temperature over 101 F (37.8 C).   Rectal bleeding or vomiting of blood.    Colonoscopy Discharge Instructions  Read the instructions outlined below and refer to this sheet in the next few weeks. These  discharge instructions provide you with general information on caring for yourself after you leave the hospital. Your doctor may also give you specific instructions. While your treatment has been planned according to the most current medical practices available, unavoidable complications occasionally occur.   ACTIVITY  You may resume your regular activity, but move at a slower pace for the next 24 hours.   Take frequent rest periods for the next 24 hours.   Walking will help get rid of the air and reduce the bloated feeling in your belly (abdomen).   No driving for 24 hours (because of the medicine (anesthesia) used during the test).    Do not sign any important legal documents or operate any machinery for 24 hours (because of the anesthesia used during the test).  NUTRITION  Drink plenty of fluids.   You may resume your normal diet as instructed by your doctor.   Begin with a light meal and progress to your normal diet. Heavy or fried foods are harder to digest and may make you feel sick to your stomach (nauseated).   Avoid alcoholic beverages for 24 hours or as instructed.  MEDICATIONS  You may resume your normal medications unless your doctor tells you otherwise.  WHAT YOU CAN EXPECT TODAY  Some feelings of bloating in the abdomen.   Passage of more gas than usual.   Spotting of blood in your stool or on the toilet paper.  IF YOU HAD POLYPS REMOVED DURING THE COLONOSCOPY:  No aspirin products for 7 days or as instructed.  No alcohol for 7 days or as instructed.   Eat a soft diet for the next 24 hours.  FINDING OUT THE RESULTS OF YOUR TEST Not all test results are available during your visit. If your test results are not back during the visit, make an appointment with your caregiver to find out the results. Do not assume everything is normal if you have not heard from your caregiver or the medical facility. It is important for you to follow up on all of your test results.   SEEK IMMEDIATE MEDICAL ATTENTION IF:  You have more than a spotting of blood in your stool.   Your belly is swollen (abdominal distention).   You are nauseated or vomiting.   You have a temperature over 101.   You have abdominal pain or discomfort that is severe or gets worse throughout the day.   Your EGD showed findings consistent with a condition called eosinophilic esophagitis which is an allergy mediated process.  I took biopsies to further evaluate.  You also had an esophageal narrowing which I dilated with a balloon.  Hopefully this helps with your swallowing.  I am going to increase your pantoprazole to 4 mg twice daily.    Unfortunately, the right side of your colon was not adequately prepped so I cannot rule out small polyps in this region.  I did not see any obvious large polyps or colon cancer.  I would recommend we repeat colonoscopy with a different colon preparation in 1 year.  Follow-up with me in 2 months. Office will notify you of appointment time>  I hope you have a great rest of your week!  Elon Alas. Abbey Chatters, D.O. Gastroenterology and Hepatology [facility]

## 2020-01-28 NOTE — Telephone Encounter (Signed)
Received a call from Endo. Pt needs a 2 month f/u with Dr. Abbey Chatters.

## 2020-01-28 NOTE — H&P (Signed)
Primary Care Physician:  Caryl Bis, MD Primary Gastroenterologist:  Dr. Abbey Chatters  Pre-Procedure History & Physical: HPI:  Derrick Lane is a 60 y.o. male is here for an EGD for dysphagia and a colonoscopy for colon cancer screening purposes. Patient states he has had dysphagia primarily with solids but also liquids for extended period time now.  Feels as though food will get stuck in his substernal region.  Notes feeling of a "gas bubble" in the region as well.  Has occasional reflux for which he takes Pepcid at night.  No chronic NSAID use.  No recent antibiotic use.  No history of diabetes.    Denies any melena hematochezia.  No unintentional weight loss.  No previous colon cancer screening modality. Patient denies any family history of colorectal cancer.  No melena or hematochezia.  No abdominal pain or unintentional weight loss.  No change in bowel habits.    Past Medical History:  Diagnosis Date  . AICD (automatic cardioverter/defibrillator) present    [employer] and defibrillator  . BCC (basal cell carcinoma of skin) 06/07/2006   Left Ear (MOH's)  . BCC (basal cell carcinoma of skin) 03/27/2019   RIGHT FOREARM TX CX3 CAUTERY  AND EXC  . BCC (basal cell carcinoma of skin) Ulcerated 10/17/2012   Right Cheek Lateral (Dr. Tarri Glenn)  . CAD (coronary artery disease)    s/p Anterior MI 5/11. LAD stent  . Cancer ([place])    skin cancer  . Chronic kidney disease, stage III (moderate) (HCC)   . Chronic systolic heart failure (HCC)    EF 10-15%.   . Dual implantable cardiac defibrillator    St. Jude-Analyze ST study  . GERD (gastroesophageal reflux disease)   . History of kidney stones   . HLD (hyperlipidemia)   . Myocardial infarction ([place]) 05/03/2009   had "widow-maker".  . Nodular basal cell carcinoma (BCC) 10/16/2013   Right Neck (Dr. Tarri Glenn)  . Nodular basal cell carcinoma (BCC) 03/25/2015   Center Forehead (Dr. Tarri Glenn)  . Nodular basal cell carcinoma (BCC) 08/16/2016    Left Preauricular, and Right Neck (curet and 5FU)  . Nodulo-ulcerative basal cell carcinoma (BCC) 08/16/2016   Right Scalp (MOH's)  . Presence of permanent cardiac pacemaker   . SCC (squamous cell carcinoma) 03/27/2019   left posterior neck-cx32fu  . SCC (squamous cell carcinoma) 03/27/2019   LEFT TEMPLE  cx3 excision  . SCC (squamous cell carcinoma) Well Diff 03/07/2018   Crown Scalp, and Right Cheek (curet and 5FU)  . 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 Tarri Glenn)  . 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 Tarri Glenn)  . Squamous cell carcinoma of skin 03/25/2015   in situ-right cheek (Dr beavers)  . Squamous cell carcinoma of skin 08/30/2018   well diff-Left cheek  . Superficial basal cell carcinoma (BCC) 10/17/2012   Left Upper Back (Dr. Tarri Glenn)    Past Surgical History:  Procedure Laterality Date  . CARDIAC CATHETERIZATION     stents inserted X3  . CHOLECYSTECTOMY N/A 12/20/2018   Procedure: LAPAROSCOPIC CHOLECYSTECTOMY;  Surgeon: Virl Cagey, MD;  Location: [facility];  Service: General;  Laterality: N/A;  . CYSTOSCOPY WITH RETROGRADE PYELOGRAM, URETEROSCOPY AND STENT PLACEMENT Right 06/07/2017   Procedure: CYSTOSCOPY WITH RETROGRADE PYELOGRAM, URETEROSCOPY AND STENT PLACEMENT HOLMIUM LASER;  Surgeon: Cleon Gustin, MD;  Location: [facility];  Service: Urology;  Laterality: Right;  . HERNIA REPAIR Bilateral    Inguinal  . ICD GENERATOR CHANGEOUT N/A 08/18/2017   Procedure: ICD GENERATOR CHANGEOUT;  Surgeon: Deboraha Sprang, MD;  Location: [facility];  Service: Cardiovascular;  Laterality: N/A;  . INSERT / REPLACE / REMOVE PACEMAKER     ICD    Prior to Admission medications   Medication Sig Start Date End Date Taking? Authorizing Provider  acetaminophen (TYLENOL) 500 MG tablet Take 1,000 mg by mouth every 6 (six)  hours as needed for moderate pain or headache.    Yes [provider]  aspirin 81 MG tablet Take 81 mg by mouth at bedtime.   Yes [provider]  candesartan (ATACAND) 8 MG tablet TAKE 2 TABLETS BY MOUTH TWICE A DAY Patient taking differently: Take 16 mg by mouth daily. 01/14/20  Yes Larey Dresser, MD  carvedilol (COREG) 12.5 MG tablet TAKE 1 TABLET (12.5 MG TOTAL) BY MOUTH 2 (TWO) TIMES DAILY. 09/16/19  Yes Larey Dresser, MD  cetirizine (ZYRTEC) 10 MG tablet Take 10 mg by mouth daily.   Yes [provider]  clopidogrel (PLAVIX) 75 MG tablet TAKE 1 TABLET BY MOUTH ONCE DAILY Patient taking differently: Take 75 mg by mouth daily. 05/21/19  Yes Bensimhon, Shaune Pascal, MD  eplerenone (INSPRA) 25 MG tablet TAKE 1 TABLET BY MOUTH EVERY DAY Patient taking differently: Take 25 mg by mouth daily. 09/16/19  Yes Larey Dresser, MD  FARXIGA 10 MG TABS tablet TAKE 10 MG BY MOUTH DAILY BEFORE BREAKFAST. 08/23/19  Yes Bensimhon, Shaune Pascal, MD  furosemide (LASIX) 20 MG tablet Take 1 tablet (20 mg total) by mouth daily. 06/28/19  Yes Bensimhon, Shaune Pascal, MD  ondansetron (ZOFRAN-ODT) 4 MG disintegrating tablet Take 4 mg by mouth every 8 (eight) hours as needed for nausea or vomiting.   Yes [provider]  pantoprazole (PROTONIX) 40 MG tablet Take 1 tablet (40 mg total) by mouth daily. 12/09/19 06/06/20 Yes Maisy Newport, Elon Alas, DO  rosuvastatin (CRESTOR) 10 MG tablet Take 10 mg by mouth every evening.  04/25/17  Yes [provider]  sildenafil (REVATIO) 20 MG tablet TAKE 1 TABLET (20 MG TOTAL) BY MOUTH DAILY AS NEEDED (FOR ED). 06/28/19  Yes Bensimhon, Shaune Pascal, MD  predniSONE (DELTASONE) 20 MG tablet Take 20 mg by mouth daily as needed (for beef and pork allergies).     [provider]    Allergies as of 12/09/2019 - Review Complete 12/09/2019  Allergen Reaction Noted  . Beef-derived products Hives and Swelling 05/04/2011  . Peach flavor Other (See Comments)  11/22/2012  . Pork-derived products Hives and Swelling 05/04/2011  . Ramipril Swelling and Hives 04/15/2010  . Spironolactone Other (See Comments) 04/26/2012    Family History  Problem Relation Age of Onset  . Heart attack Mother   . Hyperlipidemia Mother   . Hypertension Mother   . Heart failure Father   . Heart attack Father   . Hypertension Father   . Coronary artery disease Other     Social History   Socioeconomic History  . Marital status: Married    Spouse name: Not on file  . Number of children: Not on file  . Years of education: Not on file  . Highest education level: Not on file  Occupational History  . Not on file  Tobacco Use  . Smoking status: Never Smoker  . Smokeless tobacco: Never Used  Vaping Use  .  Vaping Use: Never used  Substance and Sexual Activity  . Alcohol use: Yes    Comment: occassional  . Drug use: No  . Sexual activity: Yes    Birth control/protection: None  Other Topics Concern  . Not on file  Social History Narrative  . Not on file   Social Determinants of Health   Financial Resource Strain: Not on file  Food Insecurity: Not on file  Transportation Needs: Not on file  Physical Activity: Not on file  Stress: Not on file  Social Connections: Not on file  Intimate Partner Violence: Not on file    Review of Systems: See HPI, otherwise negative ROS  Impression/Plan: Derrick Lane is here for an EGD for dysphagia and a colonoscopy for colon cancer screening purposes.  The risks of the procedure including infection, bleed, or perforation as well as benefits, limitations, alternatives and imponderables have been reviewed with the patient. Questions have been answered. All parties agreeable.

## 2020-01-28 NOTE — Anesthesia Preprocedure Evaluation (Signed)
Anesthesia Evaluation  Patient identified by MRN, date of birth, ID band Patient awake    Reviewed: Allergy & Precautions, H&P , NPO status , Patient's Chart, lab work & pertinent test results, reviewed documented beta blocker date and time   Airway Mallampati: II  TM Distance: >3 FB Neck ROM: full    Dental no notable dental hx.    Pulmonary neg pulmonary ROS,    Pulmonary exam normal breath sounds clear to auscultation       Cardiovascular Exercise Tolerance: Good + CAD and + Past MI  + pacemaker + Cardiac Defibrillator  Rhythm:regular Rate:Normal     Neuro/Psych negative neurological ROS  negative psych ROS   GI/Hepatic Neg liver ROS, GERD  Medicated,  Endo/Other  negative endocrine ROS  Renal/GU CRFRenal disease  negative genitourinary   Musculoskeletal   Abdominal   Peds  Hematology negative hematology ROS (+)   Anesthesia Other Findings   Reproductive/Obstetrics negative OB ROS                             Anesthesia Physical Anesthesia Plan  ASA: III  Anesthesia Plan: General   Post-op Pain Management:    Induction:   PONV Risk Score and Plan: TIVA and Propofol infusion  Airway Management Planned:   Additional Equipment:   Intra-op Plan:   Post-operative Plan:   Informed Consent: I have reviewed the patients History and Physical, chart, labs and discussed the procedure including the risks, benefits and alternatives for the proposed anesthesia with the patient or authorized representative who has indicated his/her understanding and acceptance.     Dental Advisory Given  Plan Discussed with: CRNA  Anesthesia Plan Comments:         Anesthesia Quick Evaluation

## 2020-01-29 ENCOUNTER — Other Ambulatory Visit: Payer: Self-pay

## 2020-01-29 LAB — SURGICAL PATHOLOGY

## 2020-01-30 ENCOUNTER — Encounter (HOSPITAL_COMMUNITY): Payer: Self-pay | Admitting: Internal Medicine

## 2020-02-06 ENCOUNTER — Ambulatory Visit (INDEPENDENT_AMBULATORY_CARE_PROVIDER_SITE_OTHER): Payer: BC Managed Care – PPO

## 2020-02-06 DIAGNOSIS — I429 Cardiomyopathy, unspecified: Secondary | ICD-10-CM | POA: Diagnosis not present

## 2020-02-10 LAB — CUP PACEART REMOTE DEVICE CHECK
Battery Remaining Longevity: 76 mo
Battery Remaining Percentage: 72 %
Battery Voltage: 2.99 V
Brady Statistic AP VP Percent: 1 %
Brady Statistic AP VS Percent: 1 %
Brady Statistic AS VP Percent: 1 %
Brady Statistic AS VS Percent: 99 %
Brady Statistic RA Percent Paced: 1 %
Brady Statistic RV Percent Paced: 1 %
Date Time Interrogation Session: 20220205004109
HighPow Impedance: 41 Ohm
HighPow Impedance: 41 Ohm
Implantable Lead Implant Date: 20190816
Implantable Lead Implant Date: 20190816
Implantable Lead Location: 753862
Implantable Lead Location: 753862
Implantable Lead Model: 185
Implantable Lead Serial Number: 348730
Implantable Pulse Generator Implant Date: 20190816
Lead Channel Impedance Value: 310 Ohm
Lead Channel Impedance Value: 350 Ohm
Lead Channel Pacing Threshold Amplitude: 0.75 V
Lead Channel Pacing Threshold Amplitude: 1.25 V
Lead Channel Pacing Threshold Pulse Width: 0.5 ms
Lead Channel Pacing Threshold Pulse Width: 0.5 ms
Lead Channel Sensing Intrinsic Amplitude: 10.5 mV
Lead Channel Sensing Intrinsic Amplitude: 4.6 mV
Lead Channel Setting Pacing Amplitude: 2 V
Lead Channel Setting Pacing Amplitude: 2.5 V
Lead Channel Setting Pacing Pulse Width: 0.5 ms
Lead Channel Setting Sensing Sensitivity: 0.5 mV
Pulse Gen Serial Number: 9763545

## 2020-02-12 NOTE — Progress Notes (Signed)
Remote ICD transmission.   

## 2020-02-24 ENCOUNTER — Encounter: Payer: Self-pay | Admitting: Internal Medicine

## 2020-03-12 ENCOUNTER — Other Ambulatory Visit (HOSPITAL_COMMUNITY): Payer: Self-pay

## 2020-03-12 MED ORDER — SILDENAFIL CITRATE 20 MG PO TABS: 20.0000 mg | ORAL_TABLET | Freq: Every day | ORAL | 2 refills | Status: DC | PRN

## 2020-04-01 ENCOUNTER — Encounter: Payer: Self-pay | Admitting: Internal Medicine

## 2020-04-01 ENCOUNTER — Ambulatory Visit: Payer: BC Managed Care – PPO | Admitting: Internal Medicine

## 2020-04-03 ENCOUNTER — Other Ambulatory Visit (HOSPITAL_COMMUNITY): Payer: Self-pay | Admitting: Cardiology

## 2020-04-14 ENCOUNTER — Other Ambulatory Visit (HOSPITAL_COMMUNITY): Payer: Self-pay | Admitting: Internal Medicine

## 2020-05-07 ENCOUNTER — Other Ambulatory Visit (HOSPITAL_COMMUNITY): Payer: Self-pay | Admitting: Internal Medicine

## 2020-05-07 ENCOUNTER — Ambulatory Visit (INDEPENDENT_AMBULATORY_CARE_PROVIDER_SITE_OTHER): Payer: BC Managed Care – PPO

## 2020-05-07 DIAGNOSIS — I5022 Chronic systolic (congestive) heart failure: Secondary | ICD-10-CM

## 2020-05-07 DIAGNOSIS — I429 Cardiomyopathy, unspecified: Secondary | ICD-10-CM

## 2020-05-11 LAB — CUP PACEART REMOTE DEVICE CHECK
Battery Remaining Longevity: 74 mo
Battery Remaining Percentage: 70 %
Battery Voltage: 2.98 V
Brady Statistic AP VP Percent: 1 %
Brady Statistic AP VS Percent: 1 %
Brady Statistic AS VP Percent: 1 %
Brady Statistic AS VS Percent: 99 %
Brady Statistic RA Percent Paced: 1 %
Brady Statistic RV Percent Paced: 1 %
Date Time Interrogation Session: 20220507001641
HighPow Impedance: 44 Ohm
HighPow Impedance: 44 Ohm
Implantable Lead Implant Date: 20190816
Implantable Lead Implant Date: 20190816
Implantable Lead Location: 753862
Implantable Lead Location: 753862
Implantable Lead Model: 185
Implantable Lead Serial Number: 348730
Implantable Pulse Generator Implant Date: 20190816
Lead Channel Impedance Value: 350 Ohm
Lead Channel Impedance Value: 390 Ohm
Lead Channel Pacing Threshold Amplitude: 0.75 V
Lead Channel Pacing Threshold Amplitude: 1.25 V
Lead Channel Pacing Threshold Pulse Width: 0.5 ms
Lead Channel Pacing Threshold Pulse Width: 0.5 ms
Lead Channel Sensing Intrinsic Amplitude: 11.8 mV
Lead Channel Sensing Intrinsic Amplitude: 5 mV
Lead Channel Setting Pacing Amplitude: 2 V
Lead Channel Setting Pacing Amplitude: 2.5 V
Lead Channel Setting Pacing Pulse Width: 0.5 ms
Lead Channel Setting Sensing Sensitivity: 0.5 mV
Pulse Gen Serial Number: 9763545

## 2020-05-27 NOTE — Progress Notes (Signed)
Remote ICD transmission.   

## 2020-06-08 ENCOUNTER — Other Ambulatory Visit (HOSPITAL_COMMUNITY): Payer: Self-pay | Admitting: Cardiology

## 2020-07-13 ENCOUNTER — Other Ambulatory Visit (HOSPITAL_COMMUNITY): Payer: Self-pay | Admitting: Internal Medicine

## 2020-08-06 ENCOUNTER — Ambulatory Visit (INDEPENDENT_AMBULATORY_CARE_PROVIDER_SITE_OTHER): Payer: BC Managed Care – PPO

## 2020-08-06 DIAGNOSIS — I429 Cardiomyopathy, unspecified: Secondary | ICD-10-CM | POA: Diagnosis not present

## 2020-08-10 LAB — CUP PACEART REMOTE DEVICE CHECK
Battery Remaining Longevity: 73 mo
Battery Remaining Percentage: 69 %
Battery Voltage: 2.98 V
Brady Statistic AP VP Percent: 1 %
Brady Statistic AP VS Percent: 1 %
Brady Statistic AS VP Percent: 1 %
Brady Statistic AS VS Percent: 99 %
Brady Statistic RA Percent Paced: 1 %
Brady Statistic RV Percent Paced: 1 %
Date Time Interrogation Session: 20220805164553
HighPow Impedance: 48 Ohm
HighPow Impedance: 48 Ohm
Implantable Lead Implant Date: 20190816
Implantable Lead Implant Date: 20190816
Implantable Lead Location: 753862
Implantable Lead Location: 753862
Implantable Lead Model: 185
Implantable Lead Serial Number: 348730
Implantable Pulse Generator Implant Date: 20190816
Lead Channel Impedance Value: 350 Ohm
Lead Channel Impedance Value: 390 Ohm
Lead Channel Pacing Threshold Amplitude: 0.75 V
Lead Channel Pacing Threshold Amplitude: 1.25 V
Lead Channel Pacing Threshold Pulse Width: 0.5 ms
Lead Channel Pacing Threshold Pulse Width: 0.5 ms
Lead Channel Sensing Intrinsic Amplitude: 11.4 mV
Lead Channel Sensing Intrinsic Amplitude: 5 mV
Lead Channel Setting Pacing Amplitude: 2 V
Lead Channel Setting Pacing Amplitude: 2.5 V
Lead Channel Setting Pacing Pulse Width: 0.5 ms
Lead Channel Setting Sensing Sensitivity: 0.5 mV
Pulse Gen Serial Number: 9763545

## 2020-08-17 ENCOUNTER — Other Ambulatory Visit: Payer: Self-pay | Admitting: Internal Medicine

## 2020-08-20 ENCOUNTER — Other Ambulatory Visit (HOSPITAL_COMMUNITY): Payer: Self-pay | Admitting: Cardiology

## 2020-08-28 NOTE — Progress Notes (Signed)
Remote ICD transmission.   

## 2020-09-07 ENCOUNTER — Other Ambulatory Visit (HOSPITAL_COMMUNITY): Payer: Self-pay | Admitting: Internal Medicine

## 2020-09-28 NOTE — Progress Notes (Deleted)
Electrophysiology Office Note Date: 09/28/2020  ID:  Derrick Lane, DOB 08/11/1960, MRN [id number]  PCP: Derrick Bis, MD Primary Cardiologist: None Electrophysiologist: Derrick Axe, MD   CC: Routine ICD follow-up  Derrick Lane is a 60 y.o. male seen today for Derrick Axe, MD for routine electrophysiology followup.  Since last being seen in our clinic the patient reports doing ***.  he denies chest pain, palpitations, dyspnea, PND, orthopnea, nausea, vomiting, dizziness, syncope, edema, weight gain, or early satiety. {He/she (caps):30048} has not had ICD shocks.   Device History: StMudlogger ICD implanted 2019 for CHF   Past Medical History:  Diagnosis Date   AICD (automatic cardioverter/defibrillator) present    [employer]-pacer and defibrillator   BCC (basal cell carcinoma of skin) 06/07/2006   Left Ear (MOH's)   BCC (basal cell carcinoma of skin) 03/27/2019   RIGHT FOREARM TX CX3 CAUTERY  AND EXC   BCC (basal cell carcinoma of skin) Ulcerated 10/17/2012   Right Cheek Lateral (Dr. Tarri Glenn)   CAD (coronary artery disease)    s/p Anterior MI 5/11. LAD stent   Cancer ([place])    skin cancer   Chronic kidney disease, stage III (moderate) (HCC)    Chronic systolic heart failure (HCC)    EF 10-15%.    Dual implantable cardiac defibrillator    St. Jude-Analyze ST study   GERD (gastroesophageal reflux disease)    History of kidney stones    HLD (hyperlipidemia)    Myocardial infarction ([place]) 05/03/2009   had "widow-maker".   Nodular basal cell carcinoma (BCC) 10/16/2013   Right Neck (Dr. Tarri Glenn)   Nodular basal cell carcinoma (BCC) 03/25/2015   Center Forehead (Dr. 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 Tarri Glenn)   Squamous cell carcinoma of skin 03/25/2015   in situ-right cheek (Dr beavers)   Squamous cell carcinoma of skin 08/30/2018   well diff-Left cheek   Superficial basal cell carcinoma (BCC) 10/17/2012   Left Upper Back (Dr. Tarri Glenn)   Past Surgical History:  Procedure Laterality Date   BALLOON DILATION N/A 01/28/2020   Procedure: BALLOON DILATION;  Surgeon: Derrick Harman, DO;  [facility];  Service: Endoscopy;  Laterality: N/A;   CARDIAC CATHETERIZATION     stents inserted X3   CHOLECYSTECTOMY N/A 12/20/2018   Procedure: LAPAROSCOPIC CHOLECYSTECTOMY;  Surgeon: Derrick Cagey, MD;  Location: [facility];  Service: General;  Laterality: N/A;   COLONOSCOPY WITH PROPOFOL N/A 01/28/2020   Procedure: COLONOSCOPY WITH PROPOFOL;  Surgeon: Derrick Harman, DO;  Location: [facility];  Service: Endoscopy;  Laterality: N/A;  10:45am   CYSTOSCOPY WITH RETROGRADE PYELOGRAM, URETEROSCOPY AND STENT PLACEMENT Right 06/07/2017   Procedure: CYSTOSCOPY WITH RETROGRADE PYELOGRAM, URETEROSCOPY AND STENT PLACEMENT HOLMIUM LASER;  Surgeon: Derrick Gustin, MD;  Location: [facility];  Service: Urology;  Laterality: Right;   ESOPHAGOGASTRODUODENOSCOPY (EGD) WITH PROPOFOL N/A 01/28/2020   Procedure: ESOPHAGOGASTRODUODENOSCOPY (EGD) WITH PROPOFOL;  Surgeon: Derrick Harman, DO;  [facility];  Service: Endoscopy;  Laterality: N/A;   HERNIA REPAIR Bilateral    Inguinal   ICD GENERATOR CHANGEOUT N/A 08/18/2017   Procedure: ICD GENERATOR CHANGEOUT;  Surgeon:  Derrick Sprang, MD;  Location: [facility];  Service: Cardiovascular;  Laterality: N/A;   INSERT / REPLACE / REMOVE PACEMAKER     ICD    Current Outpatient Medications  Medication Sig Dispense Refill   acetaminophen (TYLENOL) 500 MG tablet Take 1,000 mg by mouth every 6 (six) hours as needed for moderate pain or headache.      aspirin 81 MG tablet Take 81 mg by mouth at bedtime.     candesartan (ATACAND) 8 MG tablet Take 2 tablets (16 mg total) by mouth 2 (two) times daily. 360 tablet 3   carvedilol (COREG) 12.5 MG tablet TAKE ONE TABLET BY MOUTH TWICE DAILY 180 tablet 1   cetirizine (ZYRTEC) 10 MG tablet Take 10 mg by mouth daily.     clopidogrel (PLAVIX) 75 MG tablet Take 1 tablet (75 mg total) by mouth daily. 90 tablet 3   eplerenone (INSPRA) 25 MG tablet TAKE ONE TABLET BY MOUTH BY MOUTH EVERY DAY 90 tablet 3   FARXIGA 10 MG TABS tablet TAKE ONE TABLET BY MOUTH BY MOUTH EVERY DAY BEFORE BREAKFAST 30 tablet 5   furosemide (LASIX) 20 MG tablet Take 1 tablet (20 mg total) by mouth daily. Last refill without office visit [phone] 90 tablet 3   ondansetron (ZOFRAN-ODT) 4 MG disintegrating tablet Take 4 mg by mouth every 8 (eight) hours as needed for nausea or vomiting.     pantoprazole (PROTONIX) 40 MG tablet TAKE ONE TABLET BY MOUTH BY MOUTH TWICE DAILY BEFORE A MEAL 60 tablet 5   predniSONE (DELTASONE) 20 MG tablet Take 20 mg by mouth daily as needed (for beef and pork allergies).   1   rosuvastatin (CRESTOR) 10 MG tablet Take 10 mg by mouth every evening.   3   sildenafil (REVATIO) 20 MG tablet TAKE ONE TABLET BY MOUTH EVERY DAY AS NEEDED 10 tablet 2   No current facility-administered medications for this visit.    Allergies:   Beef-derived products, Peach flavor, Pork-derived products, Ramipril, and Spironolactone   Social History: Social History   Socioeconomic History   Marital status: Married    Spouse name: Not on file   Number of children: Not on file   Years of  education: Not on file   Highest education level: Not on file  Occupational History   Not on file  Tobacco Use   Smoking status: Never   Smokeless tobacco: Never  Vaping Use   Vaping Use: Never used  Substance and Sexual Activity   Alcohol use: Yes    Comment: occassional   Drug use: No   Sexual activity: Yes    Birth control/protection: None  Other Topics Concern   Not on file  Social History Narrative   Not on file   Social Determinants of Health   Financial Resource Strain: Not on file  Food Insecurity: Not on file  Transportation Needs: Not on file  Physical Activity: Not on file  Stress: Not on file  Social Connections: Not on file  Intimate Partner Violence: Not on file  Family History: Family History  Problem Relation Age of Onset   Heart attack Mother    Hyperlipidemia Mother    Hypertension Mother    Heart failure Father    Heart attack Father    Hypertension Father    Coronary artery disease Other     Review of Systems: All other systems reviewed and are otherwise negative except as noted above.   Physical Exam: There were no vitals filed for this visit.   GEN- The patient is well appearing, alert and oriented x 3 today.   HEENT: normocephalic, atraumatic; sclera clear, conjunctiva pink; hearing intact; oropharynx clear; neck supple, no JVP Lymph- no cervical lymphadenopathy Lungs- Clear to ausculation bilaterally, normal work of breathing.  No wheezes, rales, rhonchi Heart- Regular rate and rhythm, no murmurs, rubs or gallops, PMI not laterally displaced GI- soft, non-tender, non-distended, bowel sounds present, no hepatosplenomegaly Extremities- no clubbing or cyanosis. No edema; DP/PT/radial pulses 2+ bilaterally MS- no significant deformity or atrophy Skin- warm and dry, no rash or lesion; ICD pocket well healed Psych- euthymic mood, full affect Neuro- strength and sensation are intact  ICD interrogation- reviewed in detail today,  See  PACEART report  EKG:  EKG {ACTION; IS/IS [id number]} ordered today. Personal review of EKG ordered {Blank single:19197::"today","***"} shows ***  Recent Labs: 01/23/2020: BUN 30; Creatinine, Ser 1.81; Potassium 4.4; Sodium 137   Wt Readings from Last 3 Encounters:  01/28/20 215 lb (97.5 kg)  01/23/20 (P) 215 lb (97.5 kg)  12/09/19 218 lb 9.6 oz (99.2 kg)     Other studies Reviewed: Additional studies/ records that were reviewed today include: Previous EP office notes.   Assessment and Plan:  1.  Chronic systolic dysfunction s/p St. Jude dual chamber ICD  euvolemic today Stable on an appropriate medical regimen Normal ICD function See Pace Art report No changes today  2. CAD Denies s/s ischemia  Current medicines are reviewed at length with the patient today.   The patient {ACTIONS; HAS/DOES NOT HAVE:19233} concerns regarding his medicines.  The following changes were made today:  {NONE DEFAULTED:18576}  Labs/ tests ordered today include: *** No orders of the defined types were placed in this encounter.    Disposition:   Follow up with {Blank single:19197::"Dr. Allred","Dr. Arlan Organ. Klein","Dr. Camnitz","Dr. Lambert","EP APP"} in {gen number [id number]} {TIME; UNITS DAY/WEEK/MONTH:19136}   Signed, Raynard Mapps, PA-C  09/28/2020 2:43 PM  [address] [place] [address] [phone] (office) [phone] (fax)

## 2020-09-29 ENCOUNTER — Encounter: Payer: BC Managed Care – PPO | Admitting: Student

## 2020-09-30 NOTE — Progress Notes (Signed)
Electrophysiology Office Note Date: 09/30/2020  ID:  Derrick Lane, DOB [birth year], MRN [id number]  PCP: Caryl Bis, MD Primary Cardiologist: None Electrophysiologist: Virl Axe, MD   CC: Routine ICD follow-up  Derrick Lane is a 60 y.o. male seen today for Virl Axe, MD for routine electrophysiology followup.  Since last being seen in our clinic the patient reports doing well overall.  he denies chest pain, palpitations, dyspnea, PND, orthopnea, nausea, vomiting, dizziness, syncope, edema, weight gain, or early satiety. He has not had ICD shocks.   Device History: StMudlogger ICD implanted 2012, gen change 08/2017 for chronic systolic CHF/ICM   Past Medical History:  Diagnosis Date   AICD (automatic cardioverter/defibrillator) present    St Jude-pacer and defibrillator   BCC (basal cell carcinoma of skin) 06/07/2006   Left Ear (MOH's)   BCC (basal cell carcinoma of skin) 03/27/2019   RIGHT FOREARM TX CX3 CAUTERY  AND EXC   BCC (basal cell carcinoma of skin) Ulcerated 10/17/2012   Right Cheek Lateral (Dr. Tarri Glenn)   CAD (coronary artery disease)    s/p Anterior MI 5/11. LAD stent   Cancer ([place])    skin cancer   Chronic kidney disease, stage III (moderate) (HCC)    Chronic systolic heart failure (HCC)    EF 10-15%.    Dual implantable cardiac defibrillator    St. Jude-Analyze ST study   GERD (gastroesophageal reflux disease)    History of kidney stones    HLD (hyperlipidemia)    Myocardial infarction ([place]) 05/03/2009   had "widow-maker".   Nodular basal cell carcinoma (BCC) 10/16/2013   Right Neck (Dr. Tarri Glenn)   Nodular basal cell carcinoma (BCC) 03/25/2015   Center Forehead (Dr. 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 Tarri Glenn)   Squamous cell carcinoma of skin 03/25/2015   in situ-right cheek (Dr beavers)   Squamous cell carcinoma of skin 08/30/2018   well diff-Left cheek   Superficial basal cell carcinoma (BCC) 10/17/2012   Left Upper Back (Dr. Tarri Glenn)   Past Surgical History:  Procedure Laterality Date   BALLOON DILATION N/A 01/28/2020   Procedure: BALLOON DILATION;  Surgeon: Eloise Harman, DO;  Location[facility];  Service: Endoscopy;  Laterality: N/A;   CARDIAC CATHETERIZATION     stents inserted X3   CHOLECYSTECTOMY N/A 12/20/2018   Procedure: LAPAROSCOPIC CHOLECYSTECTOMY;  Surgeon: Virl Cagey, MD;  Location: [facility];  Service: General;  Laterality: N/A;   COLONOSCOPY WITH PROPOFOL N/A 01/28/2020   Procedure: COLONOSCOPY WITH PROPOFOL;  Surgeon: Eloise Harman, DO;  Location: [facility];  Service: Endoscopy;  Laterality: N/A;  10:45am   CYSTOSCOPY WITH RETROGRADE PYELOGRAM, URETEROSCOPY AND STENT PLACEMENT Right 06/07/2017   Procedure: CYSTOSCOPY WITH RETROGRADE PYELOGRAM, URETEROSCOPY  AND STENT PLACEMENT HOLMIUM LASER;  Surgeon: Cleon Gustin, MD;  Location: [facility];  Service: Urology;  Laterality: Right;   ESOPHAGOGASTRODUODENOSCOPY (EGD) WITH PROPOFOL N/A 01/28/2020   Procedure: ESOPHAGOGASTRODUODENOSCOPY (EGD) WITH PROPOFOL;  Surgeon: Eloise Harman, DO;  Location[facility];  Service: Endoscopy;  Laterality: N/A;   HERNIA REPAIR Bilateral    Inguinal   ICD GENERATOR CHANGEOUT N/A 08/18/2017   Procedure: ICD  GENERATOR CHANGEOUT;  Surgeon: Deboraha Sprang, MD;  Location: [place] [facility];  Service: Cardiovascular;  Laterality: N/A;   INSERT / REPLACE / REMOVE PACEMAKER     ICD    Current Outpatient Medications  Medication Sig Dispense Refill   acetaminophen (TYLENOL) 500 MG tablet Take 1,000 mg by mouth every 6 (six) hours as needed for moderate pain or headache.      aspirin 81 MG tablet Take 81 mg by mouth at bedtime.     candesartan (ATACAND) 8 MG tablet Take 2 tablets (16 mg total) by mouth 2 (two) times daily. 360 tablet 3   carvedilol (COREG) 12.5 MG tablet TAKE ONE TABLET BY MOUTH TWICE DAILY 180 tablet 1   cetirizine (ZYRTEC) 10 MG tablet Take 10 mg by mouth daily.     clopidogrel (PLAVIX) 75 MG tablet Take 1 tablet (75 mg total) by mouth daily. 90 tablet 3   eplerenone (INSPRA) 25 MG tablet TAKE ONE TABLET BY MOUTH BY MOUTH EVERY DAY 90 tablet 3   FARXIGA 10 MG TABS tablet TAKE ONE TABLET BY MOUTH BY MOUTH EVERY DAY BEFORE BREAKFAST 30 tablet 5   furosemide (LASIX) 20 MG tablet Take 1 tablet (20 mg total) by mouth daily. Last refill without office visit [phone] 90 tablet 3   ondansetron (ZOFRAN-ODT) 4 MG disintegrating tablet Take 4 mg by mouth every 8 (eight) hours as needed for nausea or vomiting.     pantoprazole (PROTONIX) 40 MG tablet TAKE ONE TABLET BY MOUTH BY MOUTH TWICE DAILY BEFORE A MEAL 60 tablet 5   predniSONE (DELTASONE) 20 MG tablet Take 20 mg by mouth daily as needed (for beef and pork allergies).   1   rosuvastatin (CRESTOR) 10 MG tablet Take 10 mg by mouth every evening.   3   sildenafil (REVATIO) 20 MG tablet TAKE ONE TABLET BY MOUTH EVERY DAY AS NEEDED 10 tablet 2   No current facility-administered medications for this visit.    Allergies:   Beef-derived products, Peach flavor, Pork-derived products, Ramipril, and Spironolactone   Social History: Social History   Socioeconomic History   Marital status: Married    Spouse name: Not on file   Number of  children: Not on file   Years of education: Not on file   Highest education level: Not on file  Occupational History   Not on file  Tobacco Use   Smoking status: Never   Smokeless tobacco: Never  Vaping Use   Vaping Use: Never used  Substance and Sexual Activity   Alcohol use: Yes    Comment: occassional   Drug use: No   Sexual activity: Yes    Birth control/protection: None  Other Topics Concern   Not on file  Social History Narrative   Not on file   Social Determinants of Health   Financial Resource Strain: Not on file  Food Insecurity: Not on file  Transportation Needs: Not on file  Physical Activity: Not on file  Stress: Not on file  Social Connections: Not on file  Intimate Partner  Violence: Not on file    Family History: Family History  Problem Relation Age of Onset   Heart attack Mother    Hyperlipidemia Mother    Hypertension Mother    Heart failure Father    Heart attack Father    Hypertension Father    Coronary artery disease Other     Review of Systems: All other systems reviewed and are otherwise negative except as noted above.   Physical Exam: There were no vitals filed for this visit.   GEN- The patient is well appearing, alert and oriented x 3 today.   HEENT: normocephalic, atraumatic; sclera clear, conjunctiva pink; hearing intact; oropharynx clear; neck supple, no JVP Lymph- no cervical lymphadenopathy Lungs- Clear to ausculation bilaterally, normal work of breathing.  No wheezes, rales, rhonchi Heart- Regular rate and rhythm, no murmurs, rubs or gallops, PMI not laterally displaced GI- soft, non-tender, non-distended, bowel sounds present, no hepatosplenomegaly Extremities- no clubbing or cyanosis. No edema; DP/PT/radial pulses 2+ bilaterally MS- no significant deformity or atrophy Skin- warm and dry, no rash or lesion; ICD pocket well healed Psych- euthymic mood, full affect Neuro- strength and sensation are intact  ICD interrogation-  reviewed in detail today,  See PACEART report  EKG:  EKG is ordered today. Personal review of EKG ordered today shows sinus bradycardia at 53 bpm  Recent Labs: 01/23/2020: BUN 30; Creatinine, Ser 1.81; Potassium 4.4; Sodium 137   Wt Readings from Last 3 Encounters:  01/28/20 215 lb (97.5 kg)  01/23/20 (P) 215 lb (97.5 kg)  12/09/19 218 lb 9.6 oz (99.2 kg)     Other studies Reviewed: Additional studies/ records that were reviewed today include: Previous EP office notes.   Assessment and Plan:  1.  Chronic systolic dysfunction s/p St. Jude dual chamber ICD  euvolemic today Stable on an appropriate medical regimen Normal ICD function See Pace Art report No changes today  2. 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 His recent Echo 09/2019 showed EF 40-45%, which is above the cutoff. He would be able to fly again if he is able to have his high voltage therapies above. The Benjamin Perez physician would like him tried on Entresto and plans on running further tests.  Pt had swelling on Ramipril but at the time had an undiagnosed allergy to pork/beef. His allergy has resolved, after acupuncture by his history.   He has not had VT or therapy from his device. Will forward message to Dr. Caryl Comes and Dr. Haroldine Laws to discuss the possibility of turning off HV therapies.   Current medicines are reviewed at length with the patient today.    Labs/ tests ordered today include:  Orders Placed This Encounter  Procedures   Basic metabolic panel   CBC   EKG 12-Lead   Disposition:   Follow up with Dr. Caryl Comes annually.  Rael, Yo, PA-C  09/30/2020 4:06 PM  [place] [address] [phone] (office) [phone] (fax)

## 2020-10-01 ENCOUNTER — Encounter: Payer: Self-pay | Admitting: Student

## 2020-10-01 ENCOUNTER — Other Ambulatory Visit: Payer: Self-pay

## 2020-10-01 ENCOUNTER — Ambulatory Visit: Payer: BC Managed Care – PPO | Admitting: Student

## 2020-10-01 VITALS — BP 104/64 | HR 53 | Ht 72.0 in | Wt 212.8 lb

## 2020-10-01 DIAGNOSIS — I429 Cardiomyopathy, unspecified: Secondary | ICD-10-CM

## 2020-10-01 DIAGNOSIS — I5022 Chronic systolic (congestive) heart failure: Secondary | ICD-10-CM

## 2020-10-01 DIAGNOSIS — I2510 Atherosclerotic heart disease of native coronary artery without angina pectoris: Secondary | ICD-10-CM | POA: Diagnosis not present

## 2020-10-01 LAB — BASIC METABOLIC PANEL
BUN/Creatinine Ratio: 15 (ref 10–24)
BUN: 24 mg/dL (ref 8–27)
CO2: 21 mmol/L (ref 20–29)
Calcium: 10.6 mg/dL — ABNORMAL HIGH (ref 8.6–10.2)
Chloride: 99 mmol/L (ref 96–106)
Creatinine, Ser: 1.56 mg/dL — ABNORMAL HIGH (ref 0.76–1.27)
Glucose: 196 mg/dL — ABNORMAL HIGH (ref 70–99)
Potassium: 4.8 mmol/L (ref 3.5–5.2)
Sodium: 138 mmol/L (ref 134–144)
eGFR: 51 mL/min/{1.73_m2} — ABNORMAL LOW (ref >59)

## 2020-10-01 LAB — CUP PACEART INCLINIC DEVICE CHECK
Battery Remaining Longevity: 75 mo
Brady Statistic RA Percent Paced: 0.86 %
Brady Statistic RV Percent Paced: 0 %
Date Time Interrogation Session: 20220929142840
HighPow Impedance: 55.7438
Implantable Lead Implant Date: 20190816
Implantable Lead Implant Date: 20190816
Implantable Lead Location: 753862
Implantable Lead Location: 753862
Implantable Lead Model: 185
Implantable Lead Serial Number: 348730
Implantable Pulse Generator Implant Date: 20190816
Lead Channel Impedance Value: 387.5 Ohm
Lead Channel Impedance Value: 412.5 Ohm
Lead Channel Pacing Threshold Amplitude: 0.75 V
Lead Channel Pacing Threshold Amplitude: 0.75 V
Lead Channel Pacing Threshold Amplitude: 1.25 V
Lead Channel Pacing Threshold Amplitude: 1.25 V
Lead Channel Pacing Threshold Pulse Width: 0.5 ms
Lead Channel Pacing Threshold Pulse Width: 0.5 ms
Lead Channel Pacing Threshold Pulse Width: 0.5 ms
Lead Channel Pacing Threshold Pulse Width: 0.5 ms
Lead Channel Sensing Intrinsic Amplitude: 12 mV
Lead Channel Sensing Intrinsic Amplitude: 4.9 mV
Lead Channel Setting Pacing Amplitude: 2 V
Lead Channel Setting Pacing Amplitude: 2.5 V
Lead Channel Setting Pacing Pulse Width: 0.5 ms
Lead Channel Setting Sensing Sensitivity: 0.5 mV
Pulse Gen Serial Number: 9763545

## 2020-10-01 LAB — CBC
Hematocrit: 50.8 % (ref 37.5–51.0)
Hemoglobin: 17.4 g/dL (ref 13.0–17.7)
MCH: 30.1 pg (ref 26.6–33.0)
MCHC: 34.3 g/dL (ref 31.5–35.7)
MCV: 88 fL (ref 79–97)
Platelets: 165 10*3/uL (ref 150–450)
RBC: 5.78 x10E6/uL (ref 4.14–5.80)
RDW: 13.4 % (ref 11.6–15.4)
WBC: 17.3 10*3/uL — ABNORMAL HIGH (ref 3.4–10.8)

## 2020-10-01 NOTE — Patient Instructions (Signed)
Medication Instructions:  Your physician recommends that you continue on your current medications as directed. Please refer to the Current Medication list given to you today.  *If you need a refill on your cardiac medications before your next appointment, please call your pharmacy*   Lab Work: TODAY: BMET, CBC  If you have labs (blood work) drawn today and your tests are completely normal, you will receive your results only by: [place] (if you have MyChart) OR A paper copy in the mail If you have any lab test that is abnormal or we need to change your treatment, we will call you to review the results.   Follow-Up: At [facility], you and your health needs are our priority.  As part of our continuing mission to provide you with exceptional heart care, we have created designated Provider Care Teams.  These Care Teams include your primary Cardiologist (physician) and Advanced Practice Providers (APPs -  Physician Assistants and Nurse Practitioners) who all work together to provide you with the care you need, when you need it.   Your next appointment:   6 month(s)  The format for your next appointment:   In Person  Provider:   You may see Virl Axe, MD or one of the following Advanced Practice Providers on your designated Care Team:   Tommye Standard, [place] "[facility]" [place], [place]

## 2020-11-05 ENCOUNTER — Ambulatory Visit: Payer: BC Managed Care – PPO

## 2020-11-11 ENCOUNTER — Other Ambulatory Visit (HOSPITAL_COMMUNITY): Payer: Self-pay | Admitting: Cardiology

## 2020-11-13 LAB — CUP PACEART REMOTE DEVICE CHECK
Battery Remaining Longevity: 69 mo
Battery Remaining Percentage: 66 %
Battery Voltage: 2.98 V
Brady Statistic AP VP Percent: 1 %
Brady Statistic AP VS Percent: 1 %
Brady Statistic AS VP Percent: 1 %
Brady Statistic AS VS Percent: 99 %
Brady Statistic RA Percent Paced: 1 %
Brady Statistic RV Percent Paced: 1 %
Date Time Interrogation Session: 20221110042259
HighPow Impedance: 44 Ohm
HighPow Impedance: 44 Ohm
Implantable Lead Implant Date: 20190816
Implantable Lead Implant Date: 20190816
Implantable Lead Location: 753862
Implantable Lead Location: 753862
Implantable Lead Model: 185
Implantable Lead Serial Number: 348730
Implantable Pulse Generator Implant Date: 20190816
Lead Channel Impedance Value: 310 Ohm
Lead Channel Impedance Value: 360 Ohm
Lead Channel Pacing Threshold Amplitude: 0.75 V
Lead Channel Pacing Threshold Amplitude: 1.25 V
Lead Channel Pacing Threshold Pulse Width: 0.5 ms
Lead Channel Pacing Threshold Pulse Width: 0.5 ms
Lead Channel Sensing Intrinsic Amplitude: 4.7 mV
Lead Channel Sensing Intrinsic Amplitude: 9.6 mV
Lead Channel Setting Pacing Amplitude: 2 V
Lead Channel Setting Pacing Amplitude: 2.5 V
Lead Channel Setting Pacing Pulse Width: 0.5 ms
Lead Channel Setting Sensing Sensitivity: 0.5 mV
Pulse Gen Serial Number: 9763545

## 2020-12-05 ENCOUNTER — Other Ambulatory Visit (HOSPITAL_COMMUNITY): Payer: Self-pay | Admitting: Internal Medicine

## 2020-12-28 ENCOUNTER — Other Ambulatory Visit (HOSPITAL_COMMUNITY): Payer: Self-pay | Admitting: Internal Medicine

## 2021-01-22 ENCOUNTER — Other Ambulatory Visit: Payer: Self-pay | Admitting: Gastroenterology

## 2021-01-27 ENCOUNTER — Other Ambulatory Visit (HOSPITAL_COMMUNITY): Payer: Self-pay | Admitting: Internal Medicine

## 2021-02-04 ENCOUNTER — Ambulatory Visit (INDEPENDENT_AMBULATORY_CARE_PROVIDER_SITE_OTHER): Payer: BC Managed Care – PPO

## 2021-02-04 DIAGNOSIS — I429 Cardiomyopathy, unspecified: Secondary | ICD-10-CM | POA: Diagnosis not present

## 2021-02-04 LAB — CUP PACEART REMOTE DEVICE CHECK
Battery Remaining Longevity: 67 mo
Battery Remaining Percentage: 64 %
Battery Voltage: 2.98 V
Brady Statistic AP VP Percent: 1 %
Brady Statistic AP VS Percent: 1 %
Brady Statistic AS VP Percent: 1 %
Brady Statistic AS VS Percent: 99 %
Brady Statistic RA Percent Paced: 1 %
Brady Statistic RV Percent Paced: 1 %
Date Time Interrogation Session: 20230202020017
HighPow Impedance: 51 Ohm
HighPow Impedance: 51 Ohm
Implantable Lead Implant Date: 20190816
Implantable Lead Implant Date: 20190816
Implantable Lead Location: 753862
Implantable Lead Location: 753862
Implantable Lead Model: 185
Implantable Lead Serial Number: 348730
Implantable Pulse Generator Implant Date: 20190816
Lead Channel Impedance Value: 360 Ohm
Lead Channel Impedance Value: 390 Ohm
Lead Channel Pacing Threshold Amplitude: 0.75 V
Lead Channel Pacing Threshold Amplitude: 1.25 V
Lead Channel Pacing Threshold Pulse Width: 0.5 ms
Lead Channel Pacing Threshold Pulse Width: 0.5 ms
Lead Channel Sensing Intrinsic Amplitude: 11.8 mV
Lead Channel Sensing Intrinsic Amplitude: 5 mV
Lead Channel Setting Pacing Amplitude: 2 V
Lead Channel Setting Pacing Amplitude: 2.5 V
Lead Channel Setting Pacing Pulse Width: 0.5 ms
Lead Channel Setting Sensing Sensitivity: 0.5 mV
Pulse Gen Serial Number: 9763545

## 2021-02-10 ENCOUNTER — Other Ambulatory Visit (HOSPITAL_COMMUNITY): Payer: Self-pay | Admitting: Cardiology

## 2021-02-10 ENCOUNTER — Encounter: Payer: Self-pay | Admitting: *Deleted

## 2021-02-10 NOTE — Progress Notes (Signed)
Remote ICD transmission.   

## 2021-02-19 ENCOUNTER — Other Ambulatory Visit (HOSPITAL_COMMUNITY): Payer: Self-pay | Admitting: Internal Medicine

## 2021-02-24 ENCOUNTER — Other Ambulatory Visit: Payer: Self-pay

## 2021-02-24 ENCOUNTER — Ambulatory Visit (HOSPITAL_COMMUNITY)
Admission: RE | Admit: 2021-02-24 | Discharge: 2021-02-24 | Disposition: A | Discharge status: Home / Self Care | Payer: BC Managed Care – PPO | Source: Ambulatory Visit | Attending: Internal Medicine | Admitting: Internal Medicine

## 2021-02-24 ENCOUNTER — Other Ambulatory Visit (HOSPITAL_COMMUNITY): Payer: Self-pay | Admitting: Internal Medicine

## 2021-02-24 ENCOUNTER — Encounter (HOSPITAL_COMMUNITY): Payer: Self-pay | Admitting: Internal Medicine

## 2021-02-24 VITALS — BP 120/70 | HR 59 | Wt 211.8 lb

## 2021-02-24 DIAGNOSIS — N183 Chronic kidney disease, stage 3 unspecified: Secondary | ICD-10-CM | POA: Insufficient documentation

## 2021-02-24 DIAGNOSIS — Z7982 Long term (current) use of aspirin: Secondary | ICD-10-CM | POA: Diagnosis not present

## 2021-02-24 DIAGNOSIS — Z955 Presence of coronary angioplasty implant and graft: Secondary | ICD-10-CM | POA: Diagnosis not present

## 2021-02-24 DIAGNOSIS — I5022 Chronic systolic (congestive) heart failure: Secondary | ICD-10-CM | POA: Diagnosis present

## 2021-02-24 DIAGNOSIS — I2510 Atherosclerotic heart disease of native coronary artery without angina pectoris: Secondary | ICD-10-CM | POA: Insufficient documentation

## 2021-02-24 DIAGNOSIS — Z7902 Long term (current) use of antithrombotics/antiplatelets: Secondary | ICD-10-CM | POA: Diagnosis not present

## 2021-02-24 DIAGNOSIS — I130 Hypertensive heart and chronic kidney disease with heart failure and stage 1 through stage 4 chronic kidney disease, or unspecified chronic kidney disease: Secondary | ICD-10-CM | POA: Insufficient documentation

## 2021-02-24 DIAGNOSIS — Z7984 Long term (current) use of oral hypoglycemic drugs: Secondary | ICD-10-CM | POA: Insufficient documentation

## 2021-02-24 DIAGNOSIS — Z79899 Other long term (current) drug therapy: Secondary | ICD-10-CM | POA: Insufficient documentation

## 2021-02-24 DIAGNOSIS — I252 Old myocardial infarction: Secondary | ICD-10-CM | POA: Diagnosis not present

## 2021-02-24 LAB — BASIC METABOLIC PANEL
Anion gap: 10 (ref 5–15)
BUN: 17 mg/dL (ref 6–20)
CO2: 26 mmol/L (ref 22–32)
Calcium: 9.7 mg/dL (ref 8.9–10.3)
Chloride: 100 mmol/L (ref 98–111)
Creatinine, Ser: 1.48 mg/dL — ABNORMAL HIGH (ref 0.61–1.24)
GFR, Estimated: 54 mL/min — ABNORMAL LOW (ref >60)
Glucose, Bld: 280 mg/dL — ABNORMAL HIGH (ref 70–99)
Potassium: 4 mmol/L (ref 3.5–5.1)
Sodium: 136 mmol/L (ref 135–145)

## 2021-02-24 NOTE — Progress Notes (Signed)
ADVANCED HF CLINIC NOTE  Patient ID: Derrick Lane, male   DOB: Sep 21, 1960, 61 y.o.   MRN: [id number] PCP: Kern Alberta EP: Dr Caryl Comes HF MD: Dr Haroldine Laws   HPI: Derrick Lane is a 61 year old with  h/o CAD s/p anterior MI in May 2011 treated with DES, LHC 08/03/2010: LAD 40% stent patent LCX 30-40% prox OM-1 stent patent RCA 40% EF 15%. S/P  ICD placement - St. Jude dual chamber ICD on Aug 05, 2010, and  Chronic systolic heart failure EF 35-40% 3/17  He not on Ace-I due to angioedema . He is not Spironolactone due to breast tenderness.  Does not tolerate hydralazine due to hypotension. (retired state trooper)  Strong 03/30/10 RA of 4, RV 33/1 (3), PA 40/17 (25), PCWP 12.  CO ([place]ick) 4.83 CI 2.42   CPX 07/08/10: pVO2 25.6 (74%) slope 34 ME/MVV 67% RER 1.17 Normal HR and BP response.   LHC 08/03/2010: LAD 40% stent patent LCX 30-40% prox OM-1 stent patent RCA 40% EF 15%.  06/13/11 ECHO 30% 11/22/12 ECHO 35-40%, RV good 3/17 EF 35-40%  ICD gen change 8/19   He returns for yearly follow up. Overall feeling fine. Denies SOB/PND/Orthopnea. Appetite ok. No fever or chills.  Taking all medications. Working part time as Licensed conveyancer [employer]  ICD interrogation in clinic: Impedance up. (He is dry) No AF/VT  ROS: All systems negative except as listed in HPI, PMH and Problem List.  Past Medical History:  Diagnosis Date   AICD (automatic cardioverter/defibrillator) present    St Jude-pacer and defibrillator   BCC (basal cell carcinoma of skin) 06/07/2006   Left Ear (MOH's)   BCC (basal cell carcinoma of skin) 03/27/2019   RIGHT FOREARM TX CX3 CAUTERY  AND EXC   BCC (basal cell carcinoma of skin) Ulcerated 10/17/2012   Right Cheek Lateral (Dr. Tarri Glenn)   CAD (coronary artery disease)    s/p Anterior MI 5/11. LAD stent   Cancer ([place])    skin cancer   Chronic kidney disease, stage III (moderate) (HCC)    Chronic systolic heart failure (HCC)    EF 10-15%.    Dual implantable cardiac  defibrillator    St. Jude-Analyze ST study   GERD (gastroesophageal reflux disease)    History of kidney stones    HLD (hyperlipidemia)    Myocardial infarction ([place]) 05/03/2009   had "widow-maker".   Nodular basal cell carcinoma (BCC) 10/16/2013   Right Neck (Dr. Tarri Glenn)   Nodular basal cell carcinoma (BCC) 03/25/2015   Center Forehead (Dr. 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 Tarri Glenn)   Squamous cell carcinoma of skin 03/25/2015   in situ-right cheek (Dr beavers)   Squamous cell carcinoma of skin 08/30/2018   well diff-Left cheek   Superficial basal  cell carcinoma (BCC) 10/17/2012   Left Upper Back (Dr. Tarri Glenn)    Current Outpatient Medications  Medication Sig Dispense Refill   acetaminophen (TYLENOL) 500 MG tablet Take 1,000 mg by mouth every 6 (six) hours as needed for moderate pain or headache.      aspirin 81 MG tablet Take 81 mg by mouth at bedtime.     candesartan (ATACAND) 8 MG tablet Take 1 tablet (8 mg total) by mouth 2 (two) times daily. NEEDS FOLLOW UP APPOINTMENT, PLEASE CALL OFFICE TO SCHEDULE 360 tablet 3   carvedilol (COREG) 12.5 MG tablet TAKE ONE TABLET BY MOUTH TWICE DAILY 180 tablet 0   cetirizine (ZYRTEC) 10 MG tablet Take 10 mg by mouth daily.     clopidogrel (PLAVIX) 75 MG tablet Take  1 tablet (75 mg total) by mouth daily. 90 tablet 3   dapagliflozin propanediol (FARXIGA) 10 MG TABS tablet Take 1 tablet (10 mg total) by mouth daily. NEEDS FOLLOW UP APPOINTMENT FOR ANYMORE REFILLS 30 tablet 0   eplerenone (INSPRA) 25 MG tablet TAKE ONE TABLET BY MOUTH BY MOUTH EVERY DAY 90 tablet 3   furosemide (LASIX) 20 MG tablet Take 1 tablet (20 mg total) by mouth daily. Last refill without office visit [phone] 90 tablet 3   ondansetron (ZOFRAN-ODT) 4 MG disintegrating tablet Take 4 mg by mouth every 8 (eight) hours as needed for nausea or vomiting.     pantoprazole (PROTONIX) 40 MG tablet TAKE ONE TABLET BY MOUTH TWICE DAILY BEFORE A MEAL 60 tablet 5   predniSONE (DELTASONE) 20 MG tablet Take 20 mg by mouth daily as needed (for beef and pork allergies).   1   rosuvastatin (CRESTOR) 10 MG tablet Take 10 mg by mouth every evening.   3   sildenafil (REVATIO) 20 MG tablet TAKE ONE TABLET BY MOUTH EVERY DAY AS NEEDED 10 tablet 2   No current facility-administered medications for this encounter.     Vitals:   02/24/21 1350  BP: 120/70  Pulse: (!) 59  SpO2: 96%  Weight: 96.1 kg (211 lb 12.8 oz)   Wt Readings from Last 3 Encounters:  02/24/21 96.1 kg (211 lb 12.8 oz)  10/01/20 96.5 kg (212 lb 12.8 oz)  01/28/20 97.5 kg (215 lb)     PHYSICAL EXAM: General:  Well appearing. No resp difficulty HEENT: normal Neck: supple. no JVD. Carotids 2+ bilat; no bruits. No lymphadenopathy or thryomegaly appreciated. Cor: PMI nondisplaced. Regular rate & rhythm. No rubs, gallops or murmurs. Lungs: clear Abdomen: soft, nontender, nondistended. No hepatosplenomegaly. No bruits or masses. Good bowel sounds. Extremities: no cyanosis, clubbing, rash, edema Neuro: alert & orientedx3, cranial nerves grossly intact. moves all 4 extremities w/o difficulty. Affect pleasant   ASSESSMENT & PLAN:  1) CAD:  - s/p anterior MI in May 2011 treated with DES - LHC 08/03/2010: LAD 40% stent patent LCX  30-40% prox OM-1 stent patent RCA 40% doing well.  - No chest pain.  - continue ASA, statin, plavix and BB - Lipids followed by Dr. Olena Heckle Goal LDL < 70.  2) Chronic systolic HF: ICM, EF 53-61% (11/2012) s/p ICD Echo 3/17 EF 35-40% - Echo 2021 EF 40-45% NYHA I. Volume status stable.  - Continue carvedilol at 12.5 twice a day.  - Continue Candesartan 8  mg BID. Not candidate for Entresto due to angioedema with ACE-I - Continue eplerenone 25 mg  daily  - Continue Farxiga 10 daily  - Can change lasix to PRN - Follows with  Dr. Caryl Comes for ICD. Had gen change in 8/19  3. HTN Stable.   Check BMET. Discussed CorVue   Follow up in 1 year.   Amy Clegg NP-C  2:15 PM  Patient seen and examined with the above-signed Advanced Practice Provider and/or Housestaff. I personally reviewed laboratory data, imaging studies and relevant notes. I independently examined the patient and formulated the important aspects of the plan. I have edited the note to reflect any of my changes or salient points. I have personally discussed the plan with the patient and/or family.  Feeling good. Denies CP or SOB. Remains active. Playing golf regularly.   General:  Well appearing. No resp difficulty HEENT: normal Neck: supple. no JVD. Carotids 2+ bilat; no bruits. No lymphadenopathy or thryomegaly appreciated. Cor: PMI nondisplaced. Regular rate & rhythm. No rubs, gallops or murmurs. Lungs: clear Abdomen: soft, nontender, nondistended. No hepatosplenomegaly. No bruits or masses. Good bowel sounds. Extremities: no cyanosis, clubbing, rash, edema Neuro: alert & orientedx3, cranial nerves grossly intact. moves all 4 extremities w/o difficulty. Affect pleasant  Stable NYHA I. Volume status ok. On good meds. ICD interrogated. No VT/AF volume ok. Due for repeat echo.   Glori Bickers, MD  2:22 PM

## 2021-02-24 NOTE — Addendum Note (Signed)
Encounter addended by: Jerl Mina, RN on: 02/24/2021 2:31 PM  Actions taken: Order list changed, Diagnosis association updated

## 2021-02-24 NOTE — Patient Instructions (Signed)
Thank you for your visit today.  There has been no changes to your medications.    Labs done today, your results will be available in MyChart, we will contact you for abnormal readings.  Your physician has requested that you have an echocardiogram. Echocardiography is a painless test that uses sound waves to create images of your heart. It provides your doctor with information about the size and shape of your heart and how well your hearts chambers and valves are working. This procedure takes approximately one hour. There are no restrictions for this procedure.  Your physician recommends that you schedule a follow-up appointment in: 1 year ( February 2023) ** please call the office in December to arrange your follow up appointment**  If you have any questions or concerns before your next appointment please send [place] a message through [place] or call our office at [phone].    TO LEAVE A MESSAGE FOR THE NURSE SELECT OPTION 2, PLEASE LEAVE A MESSAGE INCLUDING: YOUR NAME DATE OF BIRTH CALL BACK NUMBER REASON FOR CALL**this is important as we prioritize the call backs  YOU WILL RECEIVE A CALL BACK THE SAME DAY AS LONG AS YOU CALL BEFORE 4:00 PM  At the [facility], you and your health needs are our priority. As part of our continuing mission to provide you with exceptional heart care, we have created designated Provider Care Teams. These Care Teams include your primary Cardiologist (physician) and Advanced Practice Providers (APPs- Physician Assistants and Nurse Practitioners) who all work together to provide you with the care you need, when you need it.   You may see any of the following providers on your designated Care Team at your next follow up: Dr Glori Bickers Dr Haynes Kerns, NP Lyda Jester, [place] [facility] [place], [place] Audry Riles, PharmD   Please be sure to bring in all your medications bottles to every appointment.

## 2021-03-19 ENCOUNTER — Ambulatory Visit (HOSPITAL_COMMUNITY)
Admission: RE | Admit: 2021-03-19 | Discharge: 2021-03-19 | Disposition: A | Discharge status: Home / Self Care | Payer: BC Managed Care – PPO | Source: Ambulatory Visit | Attending: Internal Medicine | Admitting: Internal Medicine

## 2021-03-19 DIAGNOSIS — I5022 Chronic systolic (congestive) heart failure: Secondary | ICD-10-CM | POA: Diagnosis present

## 2021-03-19 LAB — ECHOCARDIOGRAM COMPLETE
AR max vel: 1.97 cm2
AV Area VTI: 1.89 cm2
AV Area mean vel: 1.77 cm2
AV Mean grad: 3 mmHg
AV Peak grad: 6.1 mmHg
Ao pk vel: 1.23 m/s
Area-P 1/2: 2.53 cm2
Calc EF: 34.5 %
MV VTI: 1.54 cm2
S' Lateral: 5.5 cm
Single Plane A2C EF: 35.6 %
Single Plane A4C EF: 34.1 %

## 2021-03-19 NOTE — Progress Notes (Signed)
*  PRELIMINARY RESULTS* ?Echocardiogram ?2D Echocardiogram has been performed. ? ?Derrick Lane ?03/19/2021, 9:28 AM ?

## 2021-04-06 ENCOUNTER — Telehealth (HOSPITAL_COMMUNITY): Payer: Self-pay | Admitting: *Deleted

## 2021-04-06 NOTE — Telephone Encounter (Signed)
Pts wife called and wanted to inform Dr.Bensimhon pts PCP put him on metformin ER '500mg'$  4 tablets daily because his A1C was 12.1 and glucose running in the 500's. Pts wife asked if Dr.Bensimhon agreed with this plan. She is aware Dr.Bensimhon only manages pts heart failure and cardiac issues but she said she trusts Dr.Bensimhon and wanted his opinion and to keep him in the loop.  ? ?Routed to [place]  ?

## 2021-04-08 ENCOUNTER — Other Ambulatory Visit (HOSPITAL_COMMUNITY): Payer: Self-pay | Admitting: Internal Medicine

## 2021-04-09 NOTE — Telephone Encounter (Signed)
Left detailed vm for pts wife.  

## 2021-04-15 ENCOUNTER — Other Ambulatory Visit (HOSPITAL_COMMUNITY): Payer: Self-pay | Admitting: Internal Medicine

## 2021-04-15 ENCOUNTER — Other Ambulatory Visit: Payer: Self-pay | Admitting: Gastroenterology

## 2021-04-15 NOTE — Telephone Encounter (Signed)
Last ov 12/09/2019 ?

## 2021-04-19 NOTE — Telephone Encounter (Signed)
Will refill but patient needs OV ?

## 2021-04-20 ENCOUNTER — Encounter: Payer: Self-pay | Admitting: Internal Medicine

## 2021-04-24 ENCOUNTER — Other Ambulatory Visit (HOSPITAL_COMMUNITY): Payer: Self-pay | Admitting: Cardiology

## 2021-04-24 ENCOUNTER — Other Ambulatory Visit (HOSPITAL_COMMUNITY): Payer: Self-pay | Admitting: Internal Medicine

## 2021-05-06 ENCOUNTER — Ambulatory Visit (INDEPENDENT_AMBULATORY_CARE_PROVIDER_SITE_OTHER): Payer: BC Managed Care – PPO

## 2021-05-06 DIAGNOSIS — I5022 Chronic systolic (congestive) heart failure: Secondary | ICD-10-CM | POA: Diagnosis not present

## 2021-05-06 LAB — CUP PACEART REMOTE DEVICE CHECK
Battery Remaining Longevity: 65 mo
Battery Remaining Percentage: 61 %
Battery Voltage: 2.96 V
Brady Statistic AP VP Percent: 1 %
Brady Statistic AP VS Percent: 1 %
Brady Statistic AS VP Percent: 1 %
Brady Statistic AS VS Percent: 99 %
Brady Statistic RA Percent Paced: 1 %
Brady Statistic RV Percent Paced: 1 %
Date Time Interrogation Session: 20230504020023
HighPow Impedance: 49 Ohm
HighPow Impedance: 50 Ohm
Implantable Lead Implant Date: 20190816
Implantable Lead Implant Date: 20190816
Implantable Lead Location: 753862
Implantable Lead Location: 753862
Implantable Lead Model: 185
Implantable Lead Serial Number: 348730
Implantable Pulse Generator Implant Date: 20190816
Lead Channel Impedance Value: 350 Ohm
Lead Channel Impedance Value: 360 Ohm
Lead Channel Pacing Threshold Amplitude: 0.75 V
Lead Channel Pacing Threshold Amplitude: 1.25 V
Lead Channel Pacing Threshold Pulse Width: 0.5 ms
Lead Channel Pacing Threshold Pulse Width: 0.5 ms
Lead Channel Sensing Intrinsic Amplitude: 3.6 mV
Lead Channel Sensing Intrinsic Amplitude: 9.9 mV
Lead Channel Setting Pacing Amplitude: 2 V
Lead Channel Setting Pacing Amplitude: 2.5 V
Lead Channel Setting Pacing Pulse Width: 0.5 ms
Lead Channel Setting Sensing Sensitivity: 0.5 mV
Pulse Gen Serial Number: 9763545

## 2021-05-07 ENCOUNTER — Other Ambulatory Visit (HOSPITAL_COMMUNITY): Payer: Self-pay | Admitting: Internal Medicine

## 2021-05-13 ENCOUNTER — Encounter: Payer: BC Managed Care – PPO | Attending: Family Medicine | Admitting: Nutrition

## 2021-05-13 ENCOUNTER — Encounter: Payer: Self-pay | Admitting: Nutrition

## 2021-05-13 VITALS — Ht 72.0 in | Wt 199.0 lb

## 2021-05-13 DIAGNOSIS — Z713 Dietary counseling and surveillance: Secondary | ICD-10-CM | POA: Diagnosis not present

## 2021-05-13 DIAGNOSIS — I5022 Chronic systolic (congestive) heart failure: Secondary | ICD-10-CM

## 2021-05-13 DIAGNOSIS — I2510 Atherosclerotic heart disease of native coronary artery without angina pectoris: Secondary | ICD-10-CM

## 2021-05-13 DIAGNOSIS — Z6826 Body mass index (BMI) 26.0-26.9, adult: Secondary | ICD-10-CM | POA: Insufficient documentation

## 2021-05-13 DIAGNOSIS — E119 Type 2 diabetes mellitus without complications: Secondary | ICD-10-CM | POA: Diagnosis present

## 2021-05-13 DIAGNOSIS — R634 Abnormal weight loss: Secondary | ICD-10-CM

## 2021-05-13 DIAGNOSIS — E782 Mixed hyperlipidemia: Secondary | ICD-10-CM

## 2021-05-13 DIAGNOSIS — E118 Type 2 diabetes mellitus with unspecified complications: Secondary | ICD-10-CM

## 2021-05-13 DIAGNOSIS — I5020 Unspecified systolic (congestive) heart failure: Secondary | ICD-10-CM

## 2021-05-13 NOTE — Progress Notes (Signed)
Medical Nutrition Therapy  ?Appointment Start time:  0800  Appointment End time:  0900 ? ?Primary concerns today: Dm Type 2 , Hyperlipidemia ?Referral diagnosis: E11.8,  E78.0 ?Preferred learning style: no preference ?Learning readiness: Ready, change in progress.  ? ? ?NUTRITION ASSESSMENT  ?76 y old wmale with new onset Type 2 DM. ?Went to urgent care based on symptoms of increased thirsty, blurry vision, increased urination and weight loss.  BS was 525 mg/dl. ?Family history of DM. ?Sees Dr. Quillian Quince at [facility]. ?Use to eat a lot of sweets, desserts and drink sweetened beverages. ?Has changes his eating habits and BS have improved very well. ?Metformin ER 500 mg 2 in am and 2 in pm. ?Active with taking care of grandkids. ? ?His eating habits have been inconsistent to meet his nutritional needs to control blood sugars. ?Willing to work on [place] to improve his health and his diabetes overall. ? ?He has wearing a  CGM Libre 3. It shows for the last 2 wees, 89% TIR, 10% low.1% TAR.  Estimated glucose avg is 109, Est A1C 5.9%. ? ? ?A1C was 12.2%. BS have drastically improved. ? ?04/06/21: Elevated levels:  Glu 337 mg.dl, BUN 36 mg/dl, Creatine 1.39 mg/dl, eGFR 58, AST 65 High, ALT 58 HIgh. ?TCHOL 150 mg/dl, TG 298 mg/sl, HDL low 32 mg/dl and VLDL 48, high. ? ? ?Anthropometrics  ?Wt Readings from Last 3 Encounters:  ?05/13/21 199 lb (90.3 kg)  ?02/24/21 211 lb 12.8 oz (96.1 kg)  ?10/01/20 212 lb 12.8 oz (96.5 kg)  ? ?Ht Readings from Last 3 Encounters:  ?05/13/21 6' (1.829 m)  ?10/01/20 6' (1.829 m)  ?01/28/20 6' (1.829 m)  ? ?Body mass index is 26.99 kg/m?. ?'@BMIFA' @ ?Facility age limit for growth percentiles is 20 years. ?Facility age limit for growth percentiles is 20 years. ?  ? ?Clinical ?Medical Hx: CHF, CAD, Pacemaker ?Medications: Wilder Glade ? ? ?Notable Signs/Symptoms: Blurry vision, thirsty, frequent urination,hungry ? ?Lifestyle & Dietary Hx ?Lives with his wife. His wife cooks. She is  a Software engineer and so is her daughter. 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 ?He has a libre 3 . ?Using the app: Myfitensspal app.  ? ?Estimated daily fluid intake: 80-90 oz ?Supplements: None ?Sleep: good ?Stress / self-care: none ?Current average weekly physical activity: Walks some ? ?24-Hr Dietary Recall ?First Meal: Bacon, egg and cheese biscuit ?Snack: fruit, crystal light ?Second Meal: sandwich or leftover or skips. ?Snack:  ?Third Meal: Whatever his wife cooks, crystal light water ,  ?Snack: fruit,nuts ?Beverages: water and crystal light ? ?Estimated Energy Needs ?Calories: 1800-2000 mg/dl. ?Carbohydrate: 225 g ?Protein: 135 g ?Fat: 50 g ? ? ?NUTRITION DIAGNOSIS  ?NB-1.1 Food and nutrition-related knowledge deficit As related to DIabetes Type 2.  As evidenced by A1C 12.25. ? ? ?NUTRITION INTERVENTION  ?Nutrition education (E-1) on the following topics:  ?Nutrition and Diabetes education provided on My Plate, CHO counting, meal planning, portion sizes, timing of meals, avoiding snacks between meals unless having a low blood sugar, target ranges for A1C and blood sugars, signs/symptoms and treatment of hyper/hypoglycemia, monitoring blood sugars, taking medications as prescribed, benefits of exercising 30 minutes per day and prevention of complications of DM. ? ?Lifestyle Medicine ?- Whole Food, Plant Predominant Nutrition is highly recommended: Eat Plenty of vegetables, Mushrooms, fruits, Legumes, Whole Grains, Nuts, seeds in lieu of processed meats, processed snacks/pastries red meat, poultry, eggs.  ?  ?-It is better to avoid simple carbohydrates including: Cakes,  Sweet Desserts, Ice Cream, Soda (diet and regular), Sweet Tea, Candies, Chips, Cookies, Store Bought Juices, Alcohol in Excess of  1-2 drinks a day, Lemonade,  Artificial Sweeteners, Doughnuts, Coffee Creamers, "Sugar-free" Products, etc, etc.  This is not a complete list..... ? ?Exercise: If you are able: 30 -60 minutes a day ,4 days a week, or  150 minutes a week.  The longer the better.  Combine stretch, strength, and aerobic activities.  If you were told in the past that you have high risk for cardiovascular diseases, you may seek evaluation by your heart doctor prior to initiating moderate to intense exercise programs. ?Handouts Provided Include  ?[facility] ?Meal Plan Card ? ?Learning Style & Readiness for Change ?Teaching method utilized: Visual & Auditory  ?Demonstrated degree of understanding via: Teach Back  ?Barriers to learning/adherence to lifestyle change: none ? ?Goals Established by Pt ?Goals ? ?Eat three meals per day ?Avoid snacks ?Increase vegetables. ?Don't skip meals ? ? ?MONITORING & EVALUATION ?Dietary intake, weekly physical activity, and blood sugars  in 2 months. ? ?Next Steps  ?Patient is to work on eating more plant based whole foods.. ? ?

## 2021-05-13 NOTE — Patient Instructions (Addendum)
Goals ? ?Eat three meals per day ?Avoid snacks ?Increase vegetables. ?Don't skip meals ? ?

## 2021-05-19 NOTE — Progress Notes (Signed)
Remote ICD transmission.   

## 2021-05-25 ENCOUNTER — Ambulatory Visit: Payer: BC Managed Care – PPO | Admitting: Gastroenterology

## 2021-06-02 ENCOUNTER — Ambulatory Visit: Payer: BC Managed Care – PPO | Admitting: Dermatology

## 2021-06-02 ENCOUNTER — Encounter: Payer: Self-pay | Admitting: Dermatology

## 2021-06-02 DIAGNOSIS — L570 Actinic keratosis: Secondary | ICD-10-CM | POA: Diagnosis not present

## 2021-06-02 DIAGNOSIS — D485 Neoplasm of uncertain behavior of skin: Secondary | ICD-10-CM | POA: Diagnosis not present

## 2021-06-02 DIAGNOSIS — Z1283 Encounter for screening for malignant neoplasm of skin: Secondary | ICD-10-CM

## 2021-06-02 NOTE — Patient Instructions (Signed)

## 2021-06-21 NOTE — Progress Notes (Signed)
Follow-Up Visit   Subjective  Derrick Lane is a 61 y.o. male who presents for the following: Annual Exam (Skin check full body top scalp scale and wife wants his back checked, new dx diabetes and weight loss ).  Annual examination, multiple areas to check Location:  Duration:  Quality:  Associated Signs/Symptoms: Modifying Factors:  Severity:  Timing: Context:   Objective  Well appearing patient in no apparent distress; mood and affect are within normal limits. Waist up skin check with personal history of scc and bcc.  2 possible carcinomas on cheek and arm will be biopsied.  All lesions checked with dermoscopy.  under left eye Heaped up 68m flesh-colored crust     Left Forearm - Anterior Clinically most of this lesion fits a benign keratosis with compatible dermoscopy, but there is a 2 mm extension on the ulnar sides which is a brown-gray macule with dermoscopic atypia.     Head - Anterior (Face) (7), Left Forearm - Anterior, Right Forearm - Anterior, Scalp (5) Multiple gritty to hornlike 2 to 4 mm pink crusts.    A full examination was performed including scalp, head, eyes, ears, nose, lips, neck, chest, axillae, abdomen, back, buttocks, bilateral upper extremities, bilateral lower extremities, hands, feet, fingers, toes, fingernails, and toenails. All findings within normal limits unless otherwise noted below.   Assessment & Plan    Screening exam for skin cancer  Annual skin examination.  Neoplasm of uncertain behavior of skin (2) under left eye  Skin / nail biopsy Type of biopsy: tangential   Informed consent: discussed and consent obtained   Timeout: patient name, date of birth, surgical site, and procedure verified   Anesthesia: the lesion was anesthetized in a standard fashion   Anesthetic:  1% lidocaine w/ epinephrine 1-100,000 local infiltration Instrument used: flexible razor blade   Hemostasis achieved with: aluminum chloride and  electrodesiccation   Outcome: patient tolerated procedure well   Post-procedure details: wound care instructions given    Specimen 1 - Surgical pathology Differential Diagnosis: bcc scc isk  Check Margins: No  Left Forearm - Anterior  Skin / nail biopsy Type of biopsy: tangential   Informed consent: discussed and consent obtained   Timeout: patient name, date of birth, surgical site, and procedure verified   Anesthesia: the lesion was anesthetized in a standard fashion   Anesthetic:  1% lidocaine w/ epinephrine 1-100,000 local infiltration Instrument used: flexible razor blade   Hemostasis achieved with: aluminum chloride and electrodesiccation   Outcome: patient tolerated procedure well   Post-procedure details: wound care instructions given    Specimen 2 - Surgical pathology Differential Diagnosis: bcc scc isk  Check Margins: No  AK (actinic keratosis) (14) Head - Anterior (Face) (7); Left Forearm - Anterior; Right Forearm - Anterior; Scalp (5)  Potential candidate for [place] or PDT next fall or winter  Destruction of lesion - Head - Anterior (Face), Left Forearm - Anterior, Right Forearm - Anterior, Scalp Complexity: simple   Destruction method: cryotherapy   Informed consent: discussed and consent obtained   Timeout:  patient name, date of birth, surgical site, and procedure verified Lesion destroyed using liquid nitrogen: Yes   Cryotherapy cycles:  1 Outcome: patient tolerated procedure well with no complications   Post-procedure details: wound care instructions given        I, SLavonna Monarch MD, have reviewed all documentation for this visit.  The documentation on 06/21/21 for the exam, diagnosis, procedures, and orders are all accurate and  complete.

## 2021-07-13 ENCOUNTER — Encounter: Payer: BC Managed Care – PPO | Attending: Family Medicine | Admitting: Nutrition

## 2021-07-13 ENCOUNTER — Ambulatory Visit: Payer: BC Managed Care – PPO | Admitting: Nutrition

## 2021-07-13 DIAGNOSIS — I5022 Chronic systolic (congestive) heart failure: Secondary | ICD-10-CM | POA: Insufficient documentation

## 2021-07-13 DIAGNOSIS — E118 Type 2 diabetes mellitus with unspecified complications: Secondary | ICD-10-CM | POA: Diagnosis not present

## 2021-07-13 DIAGNOSIS — R634 Abnormal weight loss: Secondary | ICD-10-CM | POA: Insufficient documentation

## 2021-07-13 DIAGNOSIS — E782 Mixed hyperlipidemia: Secondary | ICD-10-CM | POA: Insufficient documentation

## 2021-07-13 DIAGNOSIS — Z713 Dietary counseling and surveillance: Secondary | ICD-10-CM | POA: Insufficient documentation

## 2021-07-13 DIAGNOSIS — I5020 Unspecified systolic (congestive) heart failure: Secondary | ICD-10-CM

## 2021-07-13 DIAGNOSIS — I2510 Atherosclerotic heart disease of native coronary artery without angina pectoris: Secondary | ICD-10-CM | POA: Diagnosis not present

## 2021-07-13 NOTE — Progress Notes (Signed)
Medical Nutrition Therapy  Appointment Start time:  0830  Appointment End time:  0900  Primary concerns today: Dm Type 2 follow up  Referral diagnosis: e11.8. e78.0 Preferred learning style:  none(auditory, visual, hands on, no preference indicated) Learning readiness: change in progress    NUTRITION ASSESSMENT Dm Follow up He has lost 12 lbs. He has been working on cutting out snacks and not eating after supper. He feels much better. He has his Elenor Legato and wonders why it goes up when he hasn't eaten.  He notes his blood sugars are in the 150's in am and 90-150's at night. He has made a lot of changes with his food. He is now walking and playing golf for exercise Drinking only water now.. Metformin ER 500 mg 2 in am and 2 in pm. Active with taking care of grandkids.  He is making excellent progress.  Schedule to see MD soon to recheck labs.  Anthropometrics  Wt Readings from Last 3 Encounters:  05/13/21 199 lb (90.3 kg)  02/24/21 211 lb 12.8 oz (96.1 kg)  10/01/20 212 lb 12.8 oz (96.5 kg)   Ht Readings from Last 3 Encounters:  05/13/21 6' (1.829 m)  10/01/20 6' (1.829 m)  01/28/20 6' (1.829 m)   There is no height or weight on file to calculate BMI. '@BMIFA'$ @ Facility age limit for growth %iles is 20 years. Facility age limit for growth %iles is 20 years.    Clinical Medical Hx: CHF, CAD, Pacemaker Medications: Metformin 1000 mg BID. (4-500 mg per day.)  Notable Signs/Symptoms: no more symptoms vision is better.   Lifestyle & Dietary Hx Lives with his wife. His wife cooks. She is a Software engineer and so is her daughter. 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 He has a libre 3 . Using the app: Myfitensspal app.   Estimated daily fluid intake: 80-90 oz Supplements: None Sleep: good Stress / self-care: none Current average weekly physical activity: Walks some  24-Hr Dietary Recall Eating 3 better balanced meals. Drinking water   Estimated Energy Needs Calories:  1800-2000 mg/dl. Carbohydrate: 225 g Protein: 135 g Fat: 50 g   NUTRITION DIAGNOSIS  NB-1.1 Food and nutrition-related knowledge deficit As related to DIabetes Type 2.  As evidenced by A1C 12.25.   NUTRITION INTERVENTION  Nutrition education (E-1) on the following topics:  Nutrition and Diabetes education provided on My Plate, CHO counting, meal planning, portion sizes, timing of meals, avoiding snacks between meals unless having a low blood sugar, target ranges for A1C and blood sugars, signs/symptoms and treatment of hyper/hypoglycemia, monitoring blood sugars, taking medications as prescribed, benefits of exercising 30 minutes per day and prevention of complications of DM.  Lifestyle Medicine - Whole Food, Plant Predominant Nutrition is highly recommended: Eat Plenty of vegetables, Mushrooms, fruits, Legumes, Whole Grains, Nuts, seeds in lieu of processed meats, processed snacks/pastries red meat, poultry, eggs.    -It is better to avoid simple carbohydrates including: Cakes, Sweet Desserts, Ice Cream, Soda (diet and regular), Sweet Tea, Candies, Chips, Cookies, Store Bought Juices, Alcohol in Excess of  1-2 drinks a day, Lemonade,  Artificial Sweeteners, Doughnuts, Coffee Creamers, "Sugar-free" Products, etc, etc.  This is not a complete list.....  Exercise: If you are able: 30 -60 minutes a day ,4 days a week, or 150 minutes a week.  The longer the better.  Combine stretch, strength, and aerobic activities.  If you were told in the past that you have high risk for cardiovascular diseases, you  may seek evaluation by your heart doctor prior to initiating moderate to intense exercise programs. Handouts Provided Include  Llifestyle Medicine Meal Plan Card  Learning Style & Readiness for Change Teaching method utilized: Visual & Auditory  Demonstrated degree of understanding via: Teach Back  Barriers to learning/adherence to lifestyle change: none  Goals Established by Pt Goals  Eat  three meals per day Avoid snacks Increase vegetables. Don't skip meals   MONITORING & EVALUATION Dietary intake, weekly physical activity, and blood sugars  in 2 months. Recommend to reduce Metformin to 500 mg BID or stop it and see what blood sugars do without it since he has made such progress with lifestyle medicine. Next Steps  Patient is to work on eating more plant based whole foods.Marland Kitchen

## 2021-07-20 ENCOUNTER — Encounter: Payer: Self-pay | Admitting: Nutrition

## 2021-07-20 NOTE — Patient Instructions (Signed)
Goals  Keep up the great job! Goals are am BS less than 130 and Less than 150 mg/dl before bed. Keep eating more whole grains, and whole plant based foods Ask MD to reduce Metformin  ER to 500 mg BID or stop it all together and see how your blood sugars are doing. Keep exercising and strength training. Don't skip meals.  Eat before 7 pm.

## 2021-07-27 ENCOUNTER — Encounter (HOSPITAL_COMMUNITY): Payer: Self-pay | Admitting: Internal Medicine

## 2021-08-05 ENCOUNTER — Ambulatory Visit (INDEPENDENT_AMBULATORY_CARE_PROVIDER_SITE_OTHER): Payer: BC Managed Care – PPO

## 2021-08-05 DIAGNOSIS — I429 Cardiomyopathy, unspecified: Secondary | ICD-10-CM

## 2021-08-05 LAB — CUP PACEART REMOTE DEVICE CHECK
Battery Remaining Longevity: 64 mo
Battery Remaining Percentage: 60 %
Battery Voltage: 2.96 V
Brady Statistic AP VP Percent: 1 %
Brady Statistic AP VS Percent: 1 %
Brady Statistic AS VP Percent: 1 %
Brady Statistic AS VS Percent: 99 %
Brady Statistic RA Percent Paced: 1 %
Brady Statistic RV Percent Paced: 1 %
Date Time Interrogation Session: 20230803020016
HighPow Impedance: 44 Ohm
HighPow Impedance: 44 Ohm
Implantable Lead Implant Date: 20190816
Implantable Lead Implant Date: 20190816
Implantable Lead Location: 753862
Implantable Lead Location: 753862
Implantable Lead Model: 185
Implantable Lead Serial Number: 348730
Implantable Pulse Generator Implant Date: 20190816
Lead Channel Impedance Value: 310 Ohm
Lead Channel Impedance Value: 410 Ohm
Lead Channel Pacing Threshold Amplitude: 0.75 V
Lead Channel Pacing Threshold Amplitude: 1.25 V
Lead Channel Pacing Threshold Pulse Width: 0.5 ms
Lead Channel Pacing Threshold Pulse Width: 0.5 ms
Lead Channel Sensing Intrinsic Amplitude: 11.1 mV
Lead Channel Sensing Intrinsic Amplitude: 4 mV
Lead Channel Setting Pacing Amplitude: 2 V
Lead Channel Setting Pacing Amplitude: 2.5 V
Lead Channel Setting Pacing Pulse Width: 0.5 ms
Lead Channel Setting Sensing Sensitivity: 0.5 mV
Pulse Gen Serial Number: 9763545

## 2021-08-10 ENCOUNTER — Other Ambulatory Visit (HOSPITAL_COMMUNITY): Payer: Self-pay | Admitting: Internal Medicine

## 2021-08-26 NOTE — Progress Notes (Signed)
Remote ICD transmission.   

## 2021-09-09 ENCOUNTER — Telehealth: Payer: Self-pay | Admitting: *Deleted

## 2021-09-09 NOTE — Telephone Encounter (Signed)
   Pre-operative Risk Assessment    Patient Name: Derrick Lane  DOB: 11-Mar-1960 MRN: [id number]      Request for Surgical Clearance    Procedure:   CATARACT EXTRACTION WITH INTRAOCULAR LENS IMPLANT OF THE RIGHT EYE 09/21/21; FOLLOWED BY THE LEFT EYE TO BE DONE ON 09/28/21  Date of Surgery:  Clearance 09/21/21                                 Surgeon:  DR. KARL STONECIPHER Surgeon's Group or Practice Name:  [facility] Phone number:  [phone] Fax number:  [phone]   Type of Clearance Requested:   - Medical ; PER DR. Lucita Ferrara PT DOES NOT NEED TO HOLD ANY MEDICATIONS   Type of Anesthesia:   TOPICAL ANESTHESIA WITH IV MEDICATION USED    Additional requests/questions:    Derrick Lane   09/09/2021, 9:28 AM

## 2021-09-09 NOTE — Telephone Encounter (Signed)
   Patient Name: ADVIK WEATHERSPOON  DOB: [birth year] MRN: [id number]  Primary Cardiologist: None  Chart reviewed as part of pre-operative protocol coverage. Cataract extractions are recognized in guidelines as low risk surgeries that do not typically require specific preoperative testing or holding of blood thinner therapy. Therefore, given past medical history and time since last visit, based on ACC/AHA guidelines, EUTIMIO GHARIBIAN would be at acceptable risk for the planned procedure without further cardiovascular testing.   I will route this recommendation to the requesting party via Epic fax function and remove from pre-op pool.  Please call with questions.  Elgie Collard, PA-C 09/09/2021, 10:22 AM

## 2021-10-07 ENCOUNTER — Other Ambulatory Visit (HOSPITAL_COMMUNITY): Payer: Self-pay | Admitting: Internal Medicine

## 2021-11-04 ENCOUNTER — Ambulatory Visit (INDEPENDENT_AMBULATORY_CARE_PROVIDER_SITE_OTHER): Payer: BC Managed Care – PPO

## 2021-11-04 DIAGNOSIS — I429 Cardiomyopathy, unspecified: Secondary | ICD-10-CM

## 2021-11-04 LAB — CUP PACEART REMOTE DEVICE CHECK
Battery Remaining Longevity: 61 mo
Battery Remaining Percentage: 58 %
Battery Voltage: 2.96 V
Brady Statistic AP VP Percent: 1 %
Brady Statistic AP VS Percent: 1 %
Brady Statistic AS VP Percent: 1 %
Brady Statistic AS VS Percent: 99 %
Brady Statistic RA Percent Paced: 1 %
Brady Statistic RV Percent Paced: 1 %
Date Time Interrogation Session: 20231102033414
HighPow Impedance: 49 Ohm
HighPow Impedance: 49 Ohm
Implantable Lead Connection Status: 753985
Implantable Lead Connection Status: 753985
Implantable Lead Implant Date: 20190816
Implantable Lead Implant Date: 20190816
Implantable Lead Location: 753862
Implantable Lead Location: 753862
Implantable Lead Model: 185
Implantable Lead Serial Number: 348730
Implantable Pulse Generator Implant Date: 20190816
Lead Channel Impedance Value: 360 Ohm
Lead Channel Impedance Value: 440 Ohm
Lead Channel Pacing Threshold Amplitude: 0.75 V
Lead Channel Pacing Threshold Amplitude: 1.25 V
Lead Channel Pacing Threshold Pulse Width: 0.5 ms
Lead Channel Pacing Threshold Pulse Width: 0.5 ms
Lead Channel Sensing Intrinsic Amplitude: 11.8 mV
Lead Channel Sensing Intrinsic Amplitude: 4 mV
Lead Channel Setting Pacing Amplitude: 2 V
Lead Channel Setting Pacing Amplitude: 2.5 V
Lead Channel Setting Pacing Pulse Width: 0.5 ms
Lead Channel Setting Sensing Sensitivity: 0.5 mV
Pulse Gen Serial Number: 9763545

## 2021-11-11 ENCOUNTER — Other Ambulatory Visit: Payer: Self-pay | Admitting: Internal Medicine

## 2021-11-15 NOTE — Progress Notes (Signed)
Remote ICD transmission.   

## 2022-01-07 ENCOUNTER — Other Ambulatory Visit: Payer: Self-pay | Admitting: Internal Medicine

## 2022-01-07 ENCOUNTER — Other Ambulatory Visit (HOSPITAL_COMMUNITY): Payer: Self-pay | Admitting: Internal Medicine

## 2022-01-18 ENCOUNTER — Other Ambulatory Visit (HOSPITAL_COMMUNITY): Payer: Self-pay | Admitting: Cardiology

## 2022-01-27 ENCOUNTER — Other Ambulatory Visit (HOSPITAL_COMMUNITY): Payer: Self-pay | Admitting: Internal Medicine

## 2022-02-03 ENCOUNTER — Ambulatory Visit (INDEPENDENT_AMBULATORY_CARE_PROVIDER_SITE_OTHER): Payer: BC Managed Care – PPO

## 2022-02-03 DIAGNOSIS — I429 Cardiomyopathy, unspecified: Secondary | ICD-10-CM | POA: Diagnosis not present

## 2022-02-03 LAB — CUP PACEART REMOTE DEVICE CHECK
Battery Remaining Longevity: 58 mo
Battery Remaining Percentage: 55 %
Battery Voltage: 2.96 V
Brady Statistic AP VP Percent: 1 %
Brady Statistic AP VS Percent: 1 %
Brady Statistic AS VP Percent: 1 %
Brady Statistic AS VS Percent: 99 %
Brady Statistic RA Percent Paced: 1 %
Brady Statistic RV Percent Paced: 1 %
Date Time Interrogation Session: 20240201020016
HighPow Impedance: 45 Ohm
HighPow Impedance: 45 Ohm
Implantable Lead Connection Status: 753985
Implantable Lead Connection Status: 753985
Implantable Lead Implant Date: 20190816
Implantable Lead Implant Date: 20190816
Implantable Lead Location: 753862
Implantable Lead Location: 753862
Implantable Lead Model: 185
Implantable Lead Serial Number: 348730
Implantable Pulse Generator Implant Date: 20190816
Lead Channel Impedance Value: 310 Ohm
Lead Channel Impedance Value: 380 Ohm
Lead Channel Pacing Threshold Amplitude: 0.75 V
Lead Channel Pacing Threshold Amplitude: 1.25 V
Lead Channel Pacing Threshold Pulse Width: 0.5 ms
Lead Channel Pacing Threshold Pulse Width: 0.5 ms
Lead Channel Sensing Intrinsic Amplitude: 4.1 mV
Lead Channel Sensing Intrinsic Amplitude: 9.1 mV
Lead Channel Setting Pacing Amplitude: 2 V
Lead Channel Setting Pacing Amplitude: 2.5 V
Lead Channel Setting Pacing Pulse Width: 0.5 ms
Lead Channel Setting Sensing Sensitivity: 0.5 mV
Pulse Gen Serial Number: 9763545

## 2022-02-23 NOTE — Progress Notes (Signed)
Remote ICD transmission.   

## 2022-03-10 ENCOUNTER — Encounter (HOSPITAL_COMMUNITY): Payer: Self-pay | Admitting: Internal Medicine

## 2022-03-10 ENCOUNTER — Ambulatory Visit (HOSPITAL_COMMUNITY)
Admission: RE | Admit: 2022-03-10 | Discharge: 2022-03-10 | Disposition: A | Discharge status: Home / Self Care | Payer: BC Managed Care – PPO | Source: Ambulatory Visit | Attending: Internal Medicine | Admitting: Internal Medicine

## 2022-03-10 VITALS — BP 102/60 | HR 50 | Wt 201.2 lb

## 2022-03-10 DIAGNOSIS — Z955 Presence of coronary angioplasty implant and graft: Secondary | ICD-10-CM | POA: Insufficient documentation

## 2022-03-10 DIAGNOSIS — I252 Old myocardial infarction: Secondary | ICD-10-CM | POA: Diagnosis not present

## 2022-03-10 DIAGNOSIS — Z85828 Personal history of other malignant neoplasm of skin: Secondary | ICD-10-CM | POA: Insufficient documentation

## 2022-03-10 DIAGNOSIS — Z7984 Long term (current) use of oral hypoglycemic drugs: Secondary | ICD-10-CM | POA: Diagnosis not present

## 2022-03-10 DIAGNOSIS — Z79899 Other long term (current) drug therapy: Secondary | ICD-10-CM | POA: Insufficient documentation

## 2022-03-10 DIAGNOSIS — Z87442 Personal history of urinary calculi: Secondary | ICD-10-CM | POA: Insufficient documentation

## 2022-03-10 DIAGNOSIS — E1122 Type 2 diabetes mellitus with diabetic chronic kidney disease: Secondary | ICD-10-CM | POA: Insufficient documentation

## 2022-03-10 DIAGNOSIS — Z794 Long term (current) use of insulin: Secondary | ICD-10-CM | POA: Diagnosis not present

## 2022-03-10 DIAGNOSIS — N183 Chronic kidney disease, stage 3 unspecified: Secondary | ICD-10-CM | POA: Diagnosis not present

## 2022-03-10 DIAGNOSIS — I130 Hypertensive heart and chronic kidney disease with heart failure and stage 1 through stage 4 chronic kidney disease, or unspecified chronic kidney disease: Secondary | ICD-10-CM | POA: Diagnosis not present

## 2022-03-10 DIAGNOSIS — I5022 Chronic systolic (congestive) heart failure: Secondary | ICD-10-CM | POA: Insufficient documentation

## 2022-03-10 DIAGNOSIS — I2510 Atherosclerotic heart disease of native coronary artery without angina pectoris: Secondary | ICD-10-CM | POA: Diagnosis not present

## 2022-03-10 DIAGNOSIS — Z7902 Long term (current) use of antithrombotics/antiplatelets: Secondary | ICD-10-CM | POA: Insufficient documentation

## 2022-03-10 NOTE — Patient Instructions (Signed)
No changes to medications.  Your physician has requested that you have an echocardiogram. Echocardiography is a painless test that uses sound waves to create images of your heart. It provides your doctor with information about the size and shape of your heart and how well your heart's chambers and valves are working. This procedure takes approximately one hour. There are no restrictions for this procedure. Please do NOT wear cologne, perfume, aftershave, or lotions (deodorant is allowed). Please arrive 15 minutes prior to your appointment time.  Your physician recommends that you schedule a follow-up appointment in: 1 year with an echocardiogram ( March 2025) ** please call the office in January to arrange your follow up appointment. **  If you have any questions or concerns before your next appointment please send [place] a message through [place] or call our office at [phone].    TO LEAVE A MESSAGE FOR THE NURSE SELECT OPTION 2, PLEASE LEAVE A MESSAGE INCLUDING: YOUR NAME DATE OF BIRTH CALL BACK NUMBER REASON FOR CALL**this is important as we prioritize the call backs  YOU WILL RECEIVE A CALL BACK THE SAME DAY AS LONG AS YOU CALL BEFORE 4:00 PM  At the [facility], you and your health needs are our priority. As part of our continuing mission to provide you with exceptional heart care, we have created designated Provider Care Teams. These Care Teams include your primary Cardiologist (physician) and Advanced Practice Providers (APPs- Physician Assistants and Nurse Practitioners) who all work together to provide you with the care you need, when you need it.   You may see any of the following providers on your designated Care Team at your next follow up: Dr Glori Bickers Dr Loralie Champagne Dr. Roxana Hires, NP Lyda Jester, [place] [facility] [place], [place] Forestine Na, NP Audry Riles, PharmD   Please be sure to bring in all your medications  bottles to every appointment.    Thank you for choosing [facility]

## 2022-03-10 NOTE — Addendum Note (Signed)
Encounter addended by: Jerl Mina, RN on: 03/10/2022 12:53 PM  Actions taken: Order list changed, Diagnosis association updated, Clinical Note Signed

## 2022-03-10 NOTE — Progress Notes (Addendum)
ADVANCED HF CLINIC NOTE  Patient ID: Derrick Lane, male   DOB: 01/29/1960, 62 y.o.   MRN: [id number] PCP: [facility] EP: Dr Caryl Comes HF MD: Dr Haroldine Laws   HPI: Derrick Lane is a 62 year old with  h/o CAD s/p anterior MI in May 2011 treated with DES, LHC 08/03/2010: LAD 40% stent patent LCX 30-40% prox OM-1 stent patent RCA 40% EF 15%. S/P  ICD placement - St. Jude dual chamber ICD on Aug 05, 2010, and  Chronic systolic heart failure EF 35-40% 3/17  He not on Ace-I due to angioedema . He is not Spironolactone due to breast tenderness.  Does not tolerate hydralazine due to hypotension. (retired state trooper)  [place] 03/30/10 RA of 4, RV 33/1 (3), PA 40/17 (25), PCWP 12.  CO ([place]) 4.83 CI 2.42   CPX 07/08/10: pVO2 25.6 (74%) slope 34 ME/MVV 67% RER 1.17 Normal HR and BP response.   LHC 08/03/2010: LAD 40% stent patent LCX 30-40% prox OM-1 stent patent RCA 40% EF 15%.  06/13/11 ECHO 30% 11/22/12 ECHO 35-40%, RV good 3/17 EF 35-40%  ICD gen change 8/19   Echo 3/23 EF 30-35% RV ok Personally reviewed  Diagnosed with DM2 about 1 year ago. Initial HgbA1C 12 -> 6  He returns for yearly follow up. Says he is doing fine. Denies CP, SOB, orthopnea, edema or PND. 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 No problems with meds.   ICD interrogation in clinic: Volume ok . No VT/AF  ROS: All systems negative except as listed in HPI, PMH and Problem List.  Past Medical History:  Diagnosis Date   AICD (automatic cardioverter/defibrillator) present    St Jude-pacer and defibrillator   BCC (basal cell carcinoma of skin) 06/07/2006   Left Ear ([facility]H's)   BCC (basal cell carcinoma of skin) 03/27/2019   RIGHT FOREARM TX CX3 CAUTERY  AND EXC   BCC (basal cell carcinoma of skin) Ulcerated 10/17/2012   Right Cheek Lateral (Dr. Tarri Glenn)   CAD (coronary artery disease)    s/p Anterior MI 5/11. LAD stent   Cancer ([place])    skin cancer   Chronic kidney disease, stage III (moderate) (HCC)    Chronic  systolic heart failure (HCC)    EF 10-15%.    Diabetes ([place])    Dual implantable cardiac defibrillator    St. Jude-Analyze ST study   GERD (gastroesophageal reflux disease)    History of kidney stones    HLD (hyperlipidemia)    Myocardial infarction ([place]) 05/03/2009   had "widow-maker".   Nodular basal cell carcinoma (BCC) 10/16/2013   Right Neck (Dr. Tarri Glenn)   Nodular basal cell carcinoma (BCC) 03/25/2015   Center Forehead (Dr. 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 BTarri Glenn   Squamous cell carcinoma in situ (SCCIS) 03/25/2015   Right Cheek (Dr. BTarri Glenn   Squamous cell carcinoma  of skin 03/25/2015   in situ-right cheek (Dr beavers)   Squamous cell carcinoma of skin 08/30/2018   well diff-Left cheek   Superficial basal cell carcinoma (BCC) 10/17/2012   Left Upper Back (Dr. Tarri Glenn)    Current Outpatient Medications  Medication Sig Dispense Refill   acetaminophen (TYLENOL) 500 MG tablet Take 1,000 mg by mouth every 6 (six) hours as needed for moderate pain or headache.      aspirin 81 MG tablet Take 81 mg by mouth at bedtime.     benzonatate (TESSALON) 100 MG capsule Take by mouth 3 (three) times daily as needed for cough.     candesartan (ATACAND) 8 MG tablet TAKE TWO TABLETS BY MOUTH BY MOUTH TWICEDAILY 360 tablet 3   carvedilol (COREG) 12.5 MG tablet TAKE ONE TABLET BY MOUTH TWICE  DAILY 180 tablet 3   cetirizine (ZYRTEC) 10 MG tablet Take 10 mg by mouth daily.     clopidogrel (PLAVIX) 75 MG tablet TAKE ONE TABLET BY MOUTH EVERY DAY 90 tablet 3   eplerenone (INSPRA) 25 MG tablet TAKE ONE TABLET BY MOUTH BY MOUTH EVERY DAY 90 tablet 3   FARXIGA 10 MG TABS tablet TAKE ONE TABLET BY MOUTH DAILY 30 tablet 11   furosemide (LASIX) 20 MG tablet TAKE ONE TABLET BY MOUTH EVERY DAY 90 tablet 3   metFORMIN (GLUMETZA) 1000 MG (MOD) 24 hr tablet Take 1,000 mg by mouth 2 (two) times daily with a meal.     ondansetron (ZOFRAN-ODT) 4 MG disintegrating tablet Take 4 mg by mouth every 8 (eight) hours as needed for nausea or vomiting.     pantoprazole (PROTONIX) 40 MG tablet TAKE ONE TABLET BY MOUTH TWICE DAILY BEFORE A MEAL 60 tablet 5   predniSONE (DELTASONE) 20 MG tablet Take 20 mg by mouth daily as needed (for beef and pork allergies).   1   rosuvastatin (CRESTOR) 10 MG tablet Take 10 mg by mouth every evening.   3   sildenafil (REVATIO) 20 MG tablet TAKE ONE TABLET BY MOUTH EVERY DAY AS NEEDED 10 tablet 2   No current facility-administered medications for this encounter.     Vitals:   03/10/22 1226  BP: 102/60  Pulse: (!) 50  SpO2: 97%  Weight: 91.3 kg (201 lb 3.2 oz)   Wt Readings from Last 3 Encounters:  03/10/22 91.3 kg (201 lb 3.2 oz)  05/13/21 90.3 kg (199 lb)  02/24/21 96.1 kg (211 lb 12.8 oz)     PHYSICAL EXAM: General:  Well appearing. No resp difficulty HEENT: normal Neck: supple. no JVD. Carotids 2+ bilat; no bruits. No lymphadenopathy or thryomegaly appreciated. Cor: PMI nondisplaced. Regular rate & rhythm. No rubs, gallops or murmurs. Lungs: clear Abdomen: soft, nontender, nondistended. No hepatosplenomegaly. No bruits or masses. Good bowel sounds. Extremities: no cyanosis, clubbing, rash, edema Neuro: alert & orientedx3, cranial nerves grossly intact. moves all 4 extremities w/o difficulty. Affect pleasant  NSR 57 No ST-T wave abnormalities. Personally  reviewed   ASSESSMENT & PLAN:  1) CAD:  - s/p anterior MI in May 2011 treated with DES - LHC 08/03/2010: LAD 40% stent patent LCX 30-40% prox OM-1 stent patent RCA 40% doing well.  - No s/s angina - continue ASA, statin, plavix and BB - Lipids followed by Dr. Olena Heckle Goal LDL < 70.  2) Chronic systolic HF: ICM, EF 123456 (11/2012) s/p ICD Echo 3/17 EF 35-40% - Echo 2021 EF 40-45% - Echo 3/23 EF 30-35% RV ok Personally reviewed -  St. Jude Dual Chamber ICD implanted 2012, gen change 08/2017 for chronic systolic CHF/ICM  - NYHA I. Volume status stable.  - Continue carvedilol at 12.5 twice a day.  - Continue Candesartan 8  mg BID. Not candidate for Entresto due to angioedema with ACE-I - Continue eplerenone 25 mg  daily  - Continue Farxiga 10 daily  - F/u in 1 year with echo  3. HTN Stable.   4. DM2 - A1c now under control - Continue Farxiga/metformin   Glori Bickers MD 12:29 PM

## 2022-03-15 ENCOUNTER — Other Ambulatory Visit (HOSPITAL_COMMUNITY): Payer: Self-pay | Admitting: Internal Medicine

## 2022-03-31 ENCOUNTER — Encounter: Payer: Self-pay | Admitting: Internal Medicine

## 2022-05-05 ENCOUNTER — Ambulatory Visit (INDEPENDENT_AMBULATORY_CARE_PROVIDER_SITE_OTHER): Payer: BC Managed Care – PPO

## 2022-05-05 ENCOUNTER — Other Ambulatory Visit (HOSPITAL_COMMUNITY): Payer: Self-pay | Admitting: Internal Medicine

## 2022-05-05 ENCOUNTER — Other Ambulatory Visit (HOSPITAL_COMMUNITY): Payer: Self-pay | Admitting: Cardiology

## 2022-05-05 DIAGNOSIS — I429 Cardiomyopathy, unspecified: Secondary | ICD-10-CM

## 2022-05-05 LAB — CUP PACEART REMOTE DEVICE CHECK
Battery Remaining Longevity: 56 mo
Battery Remaining Percentage: 53 %
Battery Voltage: 2.96 V
Brady Statistic AP VP Percent: 1 %
Brady Statistic AP VS Percent: 1 %
Brady Statistic AS VP Percent: 1 %
Brady Statistic AS VS Percent: 99 %
Brady Statistic RA Percent Paced: 1 %
Brady Statistic RV Percent Paced: 1 %
Date Time Interrogation Session: 20240502020014
HighPow Impedance: 44 Ohm
HighPow Impedance: 44 Ohm
Implantable Lead Connection Status: 753985
Implantable Lead Connection Status: 753985
Implantable Lead Implant Date: 20190816
Implantable Lead Implant Date: 20190816
Implantable Lead Location: 753862
Implantable Lead Location: 753862
Implantable Lead Model: 185
Implantable Lead Serial Number: 348730
Implantable Pulse Generator Implant Date: 20190816
Lead Channel Impedance Value: 310 Ohm
Lead Channel Impedance Value: 360 Ohm
Lead Channel Pacing Threshold Amplitude: 0.75 V
Lead Channel Pacing Threshold Amplitude: 1.25 V
Lead Channel Pacing Threshold Pulse Width: 0.5 ms
Lead Channel Pacing Threshold Pulse Width: 0.5 ms
Lead Channel Sensing Intrinsic Amplitude: 10.1 mV
Lead Channel Sensing Intrinsic Amplitude: 4.1 mV
Lead Channel Setting Pacing Amplitude: 2 V
Lead Channel Setting Pacing Amplitude: 2.5 V
Lead Channel Setting Pacing Pulse Width: 0.5 ms
Lead Channel Setting Sensing Sensitivity: 0.5 mV
Pulse Gen Serial Number: 9763545

## 2022-05-25 NOTE — Progress Notes (Signed)
Remote ICD transmission.   

## 2022-08-04 ENCOUNTER — Ambulatory Visit (INDEPENDENT_AMBULATORY_CARE_PROVIDER_SITE_OTHER): Payer: BC Managed Care – PPO

## 2022-08-04 DIAGNOSIS — I429 Cardiomyopathy, unspecified: Secondary | ICD-10-CM | POA: Diagnosis not present

## 2022-08-04 LAB — CUP PACEART REMOTE DEVICE CHECK
Battery Remaining Longevity: 55 mo
Battery Remaining Percentage: 51 %
Battery Voltage: 2.95 V
Brady Statistic AP VP Percent: 1 %
Brady Statistic AP VS Percent: 1 %
Brady Statistic AS VP Percent: 1 %
Brady Statistic AS VS Percent: 99 %
Brady Statistic RA Percent Paced: 1 %
Brady Statistic RV Percent Paced: 1 %
Date Time Interrogation Session: 20240801020015
HighPow Impedance: 49 Ohm
HighPow Impedance: 49 Ohm
Implantable Lead Connection Status: 753985
Implantable Lead Connection Status: 753985
Implantable Lead Implant Date: 20190816
Implantable Lead Implant Date: 20190816
Implantable Lead Location: 753862
Implantable Lead Location: 753862
Implantable Lead Model: 185
Implantable Lead Serial Number: 348730
Implantable Pulse Generator Implant Date: 20190816
Lead Channel Impedance Value: 350 Ohm
Lead Channel Impedance Value: 400 Ohm
Lead Channel Pacing Threshold Amplitude: 0.75 V
Lead Channel Pacing Threshold Amplitude: 1.25 V
Lead Channel Pacing Threshold Pulse Width: 0.5 ms
Lead Channel Pacing Threshold Pulse Width: 0.5 ms
Lead Channel Sensing Intrinsic Amplitude: 12 mV
Lead Channel Sensing Intrinsic Amplitude: 4.8 mV
Lead Channel Setting Pacing Amplitude: 2 V
Lead Channel Setting Pacing Amplitude: 2.5 V
Lead Channel Setting Pacing Pulse Width: 0.5 ms
Lead Channel Setting Sensing Sensitivity: 0.5 mV
Pulse Gen Serial Number: 9763545

## 2022-08-16 NOTE — Progress Notes (Signed)
Remote ICD transmission.   

## 2022-11-03 ENCOUNTER — Ambulatory Visit (INDEPENDENT_AMBULATORY_CARE_PROVIDER_SITE_OTHER): Payer: BC Managed Care – PPO

## 2022-11-03 DIAGNOSIS — I429 Cardiomyopathy, unspecified: Secondary | ICD-10-CM | POA: Diagnosis not present

## 2022-11-14 ENCOUNTER — Ambulatory Visit: Payer: BC Managed Care – PPO

## 2022-11-14 DIAGNOSIS — I5022 Chronic systolic (congestive) heart failure: Secondary | ICD-10-CM

## 2022-11-14 DIAGNOSIS — I429 Cardiomyopathy, unspecified: Secondary | ICD-10-CM

## 2022-11-14 LAB — CUP PACEART REMOTE DEVICE CHECK
Battery Remaining Longevity: 52 mo
Battery Remaining Percentage: 49 %
Battery Voltage: 2.95 V
Brady Statistic AP VP Percent: 1 %
Brady Statistic AP VS Percent: 1 %
Brady Statistic AS VP Percent: 1 %
Brady Statistic AS VS Percent: 99 %
Brady Statistic RA Percent Paced: 1 %
Brady Statistic RV Percent Paced: 1 %
Date Time Interrogation Session: 20241109005022
HighPow Impedance: 52 Ohm
HighPow Impedance: 52 Ohm
Implantable Lead Connection Status: 753985
Implantable Lead Connection Status: 753985
Implantable Lead Implant Date: 20190816
Implantable Lead Implant Date: 20190816
Implantable Lead Location: 753862
Implantable Lead Location: 753862
Implantable Lead Model: 185
Implantable Lead Serial Number: 348730
Implantable Pulse Generator Implant Date: 20190816
Lead Channel Impedance Value: 350 Ohm
Lead Channel Impedance Value: 390 Ohm
Lead Channel Pacing Threshold Amplitude: 0.75 V
Lead Channel Pacing Threshold Amplitude: 1.25 V
Lead Channel Pacing Threshold Pulse Width: 0.5 ms
Lead Channel Pacing Threshold Pulse Width: 0.5 ms
Lead Channel Sensing Intrinsic Amplitude: 11.8 mV
Lead Channel Sensing Intrinsic Amplitude: 3.7 mV
Lead Channel Setting Pacing Amplitude: 2 V
Lead Channel Setting Pacing Amplitude: 2.5 V
Lead Channel Setting Pacing Pulse Width: 0.5 ms
Lead Channel Setting Sensing Sensitivity: 0.5 mV
Pulse Gen Serial Number: 9763545

## 2022-11-21 NOTE — Progress Notes (Signed)
Remote ICD transmission.   

## 2022-12-02 ENCOUNTER — Other Ambulatory Visit (HOSPITAL_COMMUNITY): Payer: Self-pay | Admitting: Internal Medicine

## 2022-12-06 ENCOUNTER — Other Ambulatory Visit (HOSPITAL_COMMUNITY): Payer: Self-pay | Admitting: Internal Medicine

## 2022-12-06 ENCOUNTER — Other Ambulatory Visit (HOSPITAL_COMMUNITY): Payer: Self-pay | Admitting: Cardiology

## 2022-12-07 NOTE — Progress Notes (Signed)
Remote ICD transmission.   

## 2022-12-26 ENCOUNTER — Other Ambulatory Visit (HOSPITAL_COMMUNITY): Payer: Self-pay | Admitting: Internal Medicine

## 2023-01-13 ENCOUNTER — Other Ambulatory Visit (HOSPITAL_COMMUNITY): Payer: Self-pay | Admitting: Internal Medicine

## 2023-02-13 ENCOUNTER — Ambulatory Visit (INDEPENDENT_AMBULATORY_CARE_PROVIDER_SITE_OTHER): Payer: BC Managed Care – PPO

## 2023-02-13 DIAGNOSIS — I429 Cardiomyopathy, unspecified: Secondary | ICD-10-CM

## 2023-02-13 DIAGNOSIS — I5022 Chronic systolic (congestive) heart failure: Secondary | ICD-10-CM | POA: Diagnosis not present

## 2023-02-14 LAB — CUP PACEART REMOTE DEVICE CHECK
Battery Remaining Longevity: 49 mo
Battery Remaining Percentage: 46 %
Battery Voltage: 2.95 V
Brady Statistic AP VP Percent: 1 %
Brady Statistic AP VS Percent: 1 %
Brady Statistic AS VP Percent: 1 %
Brady Statistic AS VS Percent: 99 %
Brady Statistic RA Percent Paced: 1 %
Brady Statistic RV Percent Paced: 1 %
Date Time Interrogation Session: 20250210042013
HighPow Impedance: 44 Ohm
HighPow Impedance: 44 Ohm
Implantable Lead Connection Status: 753985
Implantable Lead Connection Status: 753985
Implantable Lead Implant Date: 20190816
Implantable Lead Implant Date: 20190816
Implantable Lead Location: 753862
Implantable Lead Location: 753862
Implantable Lead Model: 185
Implantable Lead Serial Number: 348730
Implantable Pulse Generator Implant Date: 20190816
Lead Channel Impedance Value: 310 Ohm
Lead Channel Impedance Value: 380 Ohm
Lead Channel Pacing Threshold Amplitude: 0.75 V
Lead Channel Pacing Threshold Amplitude: 1.25 V
Lead Channel Pacing Threshold Pulse Width: 0.5 ms
Lead Channel Pacing Threshold Pulse Width: 0.5 ms
Lead Channel Sensing Intrinsic Amplitude: 3.6 mV
Lead Channel Sensing Intrinsic Amplitude: 9.6 mV
Lead Channel Setting Pacing Amplitude: 2 V
Lead Channel Setting Pacing Amplitude: 2.5 V
Lead Channel Setting Pacing Pulse Width: 0.5 ms
Lead Channel Setting Sensing Sensitivity: 0.5 mV
Pulse Gen Serial Number: 9763545

## 2023-03-06 ENCOUNTER — Encounter: Payer: Self-pay | Admitting: Internal Medicine

## 2023-03-20 NOTE — Addendum Note (Signed)
 Addended by: Geralyn Flash D on: 03/20/2023 10:14 AM   Modules accepted: Orders

## 2023-03-20 NOTE — Progress Notes (Signed)
 Remote ICD transmission.

## 2023-04-09 ENCOUNTER — Other Ambulatory Visit (HOSPITAL_COMMUNITY): Payer: Self-pay | Admitting: Internal Medicine

## 2023-04-11 ENCOUNTER — Telehealth (HOSPITAL_COMMUNITY): Payer: Self-pay | Admitting: Internal Medicine

## 2023-04-11 ENCOUNTER — Other Ambulatory Visit (HOSPITAL_COMMUNITY): Payer: Self-pay

## 2023-04-11 DIAGNOSIS — I5022 Chronic systolic (congestive) heart failure: Secondary | ICD-10-CM

## 2023-04-11 NOTE — Telephone Encounter (Signed)
 Front office received message from [facility] that this patient needs a f/u appt and an echocardiogram. Left a voicemail for patient to call front office back to schedule his appts.

## 2023-05-15 ENCOUNTER — Ambulatory Visit (INDEPENDENT_AMBULATORY_CARE_PROVIDER_SITE_OTHER): Payer: BC Managed Care – PPO

## 2023-05-15 DIAGNOSIS — I5022 Chronic systolic (congestive) heart failure: Secondary | ICD-10-CM

## 2023-05-15 DIAGNOSIS — I429 Cardiomyopathy, unspecified: Secondary | ICD-10-CM

## 2023-05-16 LAB — CUP PACEART REMOTE DEVICE CHECK
Battery Remaining Longevity: 47 mo
Battery Remaining Percentage: 44 %
Battery Voltage: 2.95 V
Brady Statistic AP VP Percent: 1 %
Brady Statistic AP VS Percent: 1 %
Brady Statistic AS VP Percent: 1 %
Brady Statistic AS VS Percent: 99 %
Brady Statistic RA Percent Paced: 1 %
Brady Statistic RV Percent Paced: 1 %
Date Time Interrogation Session: 20250512035402
HighPow Impedance: 49 Ohm
HighPow Impedance: 49 Ohm
Implantable Lead Connection Status: 753985
Implantable Lead Connection Status: 753985
Implantable Lead Implant Date: 20190816
Implantable Lead Implant Date: 20190816
Implantable Lead Location: 753862
Implantable Lead Location: 753862
Implantable Lead Model: 185
Implantable Lead Serial Number: 348730
Implantable Pulse Generator Implant Date: 20190816
Lead Channel Impedance Value: 350 Ohm
Lead Channel Impedance Value: 380 Ohm
Lead Channel Pacing Threshold Amplitude: 0.75 V
Lead Channel Pacing Threshold Amplitude: 1.25 V
Lead Channel Pacing Threshold Pulse Width: 0.5 ms
Lead Channel Pacing Threshold Pulse Width: 0.5 ms
Lead Channel Sensing Intrinsic Amplitude: 10.8 mV
Lead Channel Sensing Intrinsic Amplitude: 4.1 mV
Lead Channel Setting Pacing Amplitude: 2 V
Lead Channel Setting Pacing Amplitude: 2.5 V
Lead Channel Setting Pacing Pulse Width: 0.5 ms
Lead Channel Setting Sensing Sensitivity: 0.5 mV
Pulse Gen Serial Number: 9763545

## 2023-05-17 ENCOUNTER — Ambulatory Visit: Payer: Self-pay | Admitting: Cardiology

## 2023-05-18 NOTE — Progress Notes (Signed)
 Message sent to scheduler.

## 2023-06-28 NOTE — Addendum Note (Signed)
 Addended by: TAWNI DRILLING D on: 06/28/2023 12:03 PM   Modules accepted: Orders

## 2023-06-28 NOTE — Progress Notes (Signed)
 Remote ICD transmission.

## 2023-07-20 ENCOUNTER — Telehealth (HOSPITAL_COMMUNITY): Payer: Self-pay

## 2023-07-20 NOTE — Telephone Encounter (Signed)
 Called to confirm/remind patient of their appointment at the [facility] on 07/21/23.   Appointment:   [] Confirmed  [x] Left mess   [] No answer/No voice mail  [] VM Full/unable to leave message  [] Phone not in service  And to bring in all medications and/or complete list.

## 2023-07-21 ENCOUNTER — Ambulatory Visit (HOSPITAL_COMMUNITY): Payer: Self-pay | Admitting: Adult Health

## 2023-07-21 ENCOUNTER — Ambulatory Visit (HOSPITAL_COMMUNITY)
Admission: RE | Admit: 2023-07-21 | Discharge: 2023-07-21 | Disposition: A | Discharge status: Home / Self Care | Source: Ambulatory Visit | Attending: Adult Health | Admitting: Adult Health

## 2023-07-21 ENCOUNTER — Encounter (HOSPITAL_COMMUNITY): Payer: Self-pay

## 2023-07-21 ENCOUNTER — Ambulatory Visit (HOSPITAL_COMMUNITY)
Admission: RE | Admit: 2023-07-21 | Discharge: 2023-07-21 | Disposition: A | Discharge status: Home / Self Care | Source: Ambulatory Visit | Attending: Internal Medicine | Admitting: Internal Medicine

## 2023-07-21 VITALS — BP 118/72 | HR 56 | Ht 72.0 in | Wt 208.0 lb

## 2023-07-21 DIAGNOSIS — I5022 Chronic systolic (congestive) heart failure: Secondary | ICD-10-CM

## 2023-07-21 DIAGNOSIS — I358 Other nonrheumatic aortic valve disorders: Secondary | ICD-10-CM | POA: Diagnosis not present

## 2023-07-21 LAB — BASIC METABOLIC PANEL WITH GFR
Anion gap: 9 (ref 5–15)
BUN: 17 mg/dL (ref 8–23)
CO2: 23 mmol/L (ref 22–32)
Calcium: 9.7 mg/dL (ref 8.9–10.3)
Chloride: 108 mmol/L (ref 98–111)
Creatinine, Ser: 1.35 mg/dL — ABNORMAL HIGH (ref 0.61–1.24)
GFR, Estimated: 59 mL/min — ABNORMAL LOW (ref >60)
Glucose, Bld: 142 mg/dL — ABNORMAL HIGH (ref 70–99)
Potassium: 4.1 mmol/L (ref 3.5–5.1)
Sodium: 140 mmol/L (ref 135–145)

## 2023-07-21 LAB — ECHOCARDIOGRAM COMPLETE
Area-P 1/2: 3.17 cm2
Calc EF: 57.8 %
S' Lateral: 4.6 cm
Single Plane A2C EF: 57.5 %
Single Plane A4C EF: 56.5 %

## 2023-07-21 NOTE — Patient Instructions (Signed)
 Medication Changes:  None, continue current medications  Lab Work:  Labs done today, your results will be available in MyChart, we will contact you for abnormal readings.  Special Instructions // Education:  Do the following things EVERYDAY: Weigh yourself in the morning before breakfast. Write it down and keep it in a log. Take your medicines as prescribed Eat low salt foods--Limit salt (sodium) to 2000 mg per day.  Stay as active as you can everyday Limit all fluids for the day to less than 2 liters   Follow-Up in: 1 year (July 2025), **PLEASE CALL OUR OFFICE IN MAY TO SCHEDULE THIS APPOINTMENT   At the [facility], you and your health needs are our priority. We have a designated team specialized in the treatment of Heart Failure. This Care Team includes your primary Heart Failure Specialized Cardiologist (physician), Advanced Practice Providers (APPs- Physician Assistants and Nurse Practitioners), and Pharmacist who all work together to provide you with the care you need, when you need it.   You may see any of the following providers on your designated Care Team at your next follow up:  Dr. Toribio Fuel Dr. Ezra Shuck Dr. Ria Commander Dr. Odis Brownie Greig Mosses, NP Caffie Shed, [place] [facility] [place], [place] Beckey Coe, NP [place] Lee, NP Tinnie Redman, PharmD   Please be sure to bring in all your medications bottles to every appointment.   Need to Contact Us :  If you have any questions or concerns before your next appointment please send us  a message through [place] or call our office at [phone].    TO LEAVE A MESSAGE FOR THE NURSE SELECT OPTION 2, PLEASE LEAVE A MESSAGE INCLUDING: YOUR NAME DATE OF BIRTH CALL BACK NUMBER REASON FOR CALL**this is important as we prioritize the call backs  YOU WILL RECEIVE A CALL BACK THE SAME DAY AS LONG AS YOU CALL BEFORE 4:00 PM

## 2023-07-21 NOTE — Progress Notes (Incomplete)
 [facility] NOTE  Patient ID: Derrick Lane, male   DOB: 08/18/1960, 63 y.o.   MRN: [id number] PCP: Jerel Kipper EP: Dr Fernande HF MD: Dr Cherrie   HPI: Derrick Lane is a 63 year old with  h/o CAD s/p anterior MI in May 2011 treated with DES, LHC 08/03/2010: LAD 40% stent patent LCX 30-40% prox OM-1 stent patent RCA 40% EF 15%. S/P  ICD placement - St. Jude dual chamber ICD on Aug 05, 2010, DMII, and  Chronic systolic heart failure EF 35-40% 3/17  He is not on Ace-I due to angioedema . He is not Spironolactone  due to breast tenderness.  Does not tolerate hydralazine  due to hypotension. 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  CO (Fick) 4.83 CI 2.42   CPX 07/08/10: pVO2 25.6 (74%) slope 34 ME/MVV 67% RER 1.17 Normal HR and BP response.   LHC 08/03/2010: LAD 40% stent patent LCX 30-40% prox OM-1 stent patent RCA 40% EF 15%.  06/13/11 ECHO 30% 11/22/12 ECHO 35-40%, RV good 3/17 EF 35-40%  ICD gen change 8/19   Echo 3/23 EF 30-35% RV ok Personally reviewed  Today he returns for HF follow up.Overall feeling fine. Denies SOB/PND/Orthopnea. No chest pain. Appetite ok. No fever or chills.  Taking all medications. Continues to work full time.   ICD interrogation: No AT/AF.    ROS: All systems negative except as listed in HPI, PMH and Problem List.  Past Medical History:  Diagnosis Date   AICD (automatic cardioverter/defibrillator) present    St Jude-pacer and defibrillator   BCC (basal cell carcinoma of skin) 06/07/2006   Left Ear (MOH's)   BCC (basal cell carcinoma of skin) 03/27/2019   RIGHT FOREARM TX CX3 CAUTERY  AND EXC   BCC (basal cell carcinoma of skin) Ulcerated 10/17/2012   Right Cheek Lateral (Dr. Eda)   CAD (coronary artery disease)    s/p Anterior MI 5/11. LAD stent   Cancer (HCC)    skin cancer   Chronic kidney disease, stage III (moderate) (HCC)    Chronic systolic heart failure (HCC)    EF 10-15%.    Diabetes (HCC)     Dual implantable cardiac defibrillator    St. Jude-Analyze ST study   GERD (gastroesophageal reflux disease)    History of kidney stones    HLD (hyperlipidemia)    Myocardial infarction (HCC) 05/03/2009   had widow-maker.   Nodular basal cell carcinoma (BCC) 10/16/2013   Right Neck (Dr. Eda)   Nodular basal cell carcinoma (BCC) 03/25/2015   Center Forehead (Dr. 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 Eda)   Squamous cell carcinoma in situ (SCCIS) 03/25/2015   Right Cheek (Dr. Eda)   Squamous cell carcinoma of skin 03/25/2015   in situ-right cheek (Dr beavers)   Squamous cell carcinoma  of skin 08/30/2018   well diff-Left cheek   Superficial basal cell carcinoma (BCC) 10/17/2012   Left Upper Back (Dr. Eda)    Current Outpatient Medications  Medication Sig Dispense Refill   acetaminophen  (TYLENOL ) 500 MG tablet Take 1,000 mg by mouth every 6 (six) hours as needed for moderate pain or headache.      aspirin 81 MG tablet Take 81 mg by mouth at bedtime.     benzonatate (TESSALON) 100 MG capsule Take by mouth 3 (three) times daily as needed for cough.     candesartan  (ATACAND ) 8 MG tablet TAKE TWO TABLETS BY MOUTH BY MOUTH TWICEDAILY 360 tablet 3   carvedilol  (COREG ) 12.5 MG tablet TAKE ONE TABLET BY MOUTH TWICE DAILY 180 tablet 3   cetirizine (ZYRTEC) 10 MG tablet Take 10 mg by  mouth daily.     clopidogrel  (PLAVIX ) 75 MG tablet TAKE ONE TABLET BY MOUTH EVERY DAY 90 tablet 3   eplerenone  (INSPRA ) 25 MG tablet TAKE ONE TABLET BY MOUTH BY MOUTH EVERY DAY 90 tablet 3   FARXIGA  10 MG TABS tablet TAKE ONE TABLET BY MOUTH DAILY 30 tablet 11   furosemide  (LASIX ) 20 MG tablet TAKE ONE TABLET BY MOUTH EVERY DAY 90 tablet 3   metFORMIN (GLUMETZA) 1000 MG (MOD) 24 hr tablet Take 1,000 mg by mouth 2 (two) times daily with a meal.     ondansetron  (ZOFRAN -ODT) 4 MG disintegrating tablet Take 4 mg by mouth every 8 (eight) hours as needed for nausea or vomiting.     pantoprazole  (PROTONIX ) 40 MG tablet TAKE ONE TABLET BY MOUTH TWICE DAILY BEFORE A MEAL 60 tablet 5   predniSONE (DELTASONE) 20 MG tablet Take 20 mg by mouth daily as needed (for beef and pork allergies).   1   rosuvastatin  (CRESTOR ) 10 MG tablet Take 10 mg by mouth every evening.   3   sildenafil  (REVATIO ) 20 MG tablet TAKE ONE TABLET BY MOUTH EVERY DAY AS NEEDED 10 tablet 2   No current facility-administered medications for this encounter.     Vitals:   07/21/23 1111  BP: 118/72  Pulse: (!) 56  SpO2: 97%  Weight: 94.3 kg (208 lb)  Height: 6' (1.829 m)    Wt Readings from Last 3 Encounters:  07/21/23 94.3 kg (208 lb)  03/10/22 91.3 kg (201 lb 3.2 oz)  05/13/21 90.3 kg (199 lb)     PHYSICAL EXAM: General:   No resp difficulty Neck: no JVD.  Cor: Regular rate & rhythm. Lungs: clear Abdomen: soft, nontender, nondistended.  Extremities: no  edema Neuro: alert & oriented x3  ASSESSMENT & PLAN:  1) CAD:  - s/p anterior MI in May 2011 treated with DES - LHC 08/03/2010: LAD 40% stent patent LCX 30-40% prox OM-1 stent patent RCA 40% doing well.  - No chest pain.  - continue ASA, statin, plavix  and BB - Lipids followed by Dr. Blondie Goal LDL < 70.  2) Chronic systolic HF: ICM, EF 35-40% (11/2012) s/p ICD Echo 3/17 EF 35-40% - Echo 2021 EF 40-45% - Echo 3/23 EF 30-35% RV ok  - St. Jude Dual Chamber ICD  implanted 2012, gen change 08/2017 for chronic systolic CHF/ICM  -NYHA I .  Appears euvolemic.  - Continue carvedilol  at 12.5 twice a day.  - Continue Candesartan  8  mg BID. Not candidate for Entresto due to angioedema with ACE-I - Continue eplerenone  25 mg  daily  - Continue Farxiga  10 daily  -Check BMET  3. HTN Stable.   4. DM2 - A1c now under control - Continue Farxiga /metformin  Echo today discussed and reviewed by Dr Cherrie. EF 40-45%   Follow up in 1 year Dr Cherrie Greig Mosses NP-C  11:17 AM

## 2023-08-10 ENCOUNTER — Encounter: Payer: Self-pay | Admitting: Cardiology

## 2023-08-14 ENCOUNTER — Ambulatory Visit (INDEPENDENT_AMBULATORY_CARE_PROVIDER_SITE_OTHER): Payer: BC Managed Care – PPO

## 2023-08-14 DIAGNOSIS — I5022 Chronic systolic (congestive) heart failure: Secondary | ICD-10-CM

## 2023-08-14 LAB — CUP PACEART REMOTE DEVICE CHECK
Battery Remaining Longevity: 44 mo
Battery Remaining Percentage: 43 %
Battery Voltage: 2.93 V
Brady Statistic AP VP Percent: 1 %
Brady Statistic AP VS Percent: 1 %
Brady Statistic AS VP Percent: 1 %
Brady Statistic AS VS Percent: 99 %
Brady Statistic RA Percent Paced: 1 %
Brady Statistic RV Percent Paced: 1 %
Date Time Interrogation Session: 20250811020028
HighPow Impedance: 44 Ohm
HighPow Impedance: 44 Ohm
Implantable Lead Connection Status: 753985
Implantable Lead Connection Status: 753985
Implantable Lead Implant Date: 20190816
Implantable Lead Implant Date: 20190816
Implantable Lead Location: 753862
Implantable Lead Location: 753862
Implantable Lead Model: 185
Implantable Lead Serial Number: 348730
Implantable Pulse Generator Implant Date: 20190816
Lead Channel Impedance Value: 310 Ohm
Lead Channel Impedance Value: 360 Ohm
Lead Channel Pacing Threshold Amplitude: 0.75 V
Lead Channel Pacing Threshold Amplitude: 1.25 V
Lead Channel Pacing Threshold Pulse Width: 0.5 ms
Lead Channel Pacing Threshold Pulse Width: 0.5 ms
Lead Channel Sensing Intrinsic Amplitude: 4 mV
Lead Channel Sensing Intrinsic Amplitude: 9.7 mV
Lead Channel Setting Pacing Amplitude: 2 V
Lead Channel Setting Pacing Amplitude: 2.5 V
Lead Channel Setting Pacing Pulse Width: 0.5 ms
Lead Channel Setting Sensing Sensitivity: 0.5 mV
Pulse Gen Serial Number: 9763545

## 2023-08-16 ENCOUNTER — Ambulatory Visit: Payer: Self-pay | Admitting: Cardiology

## 2023-09-18 ENCOUNTER — Encounter: Payer: Self-pay | Admitting: Cardiology

## 2023-09-18 ENCOUNTER — Ambulatory Visit: Attending: Cardiology | Admitting: Cardiology

## 2023-09-18 VITALS — BP 132/70 | HR 61 | Ht 72.0 in | Wt 203.0 lb

## 2023-09-18 DIAGNOSIS — I2510 Atherosclerotic heart disease of native coronary artery without angina pectoris: Secondary | ICD-10-CM | POA: Diagnosis not present

## 2023-09-18 DIAGNOSIS — I5022 Chronic systolic (congestive) heart failure: Secondary | ICD-10-CM

## 2023-09-18 DIAGNOSIS — Z95810 Presence of automatic (implantable) cardiac defibrillator: Secondary | ICD-10-CM

## 2023-09-18 DIAGNOSIS — I255 Ischemic cardiomyopathy: Secondary | ICD-10-CM | POA: Diagnosis not present

## 2023-09-18 LAB — CUP PACEART INCLINIC DEVICE CHECK
Battery Remaining Longevity: 46 mo
Brady Statistic RA Percent Paced: 0.29 %
Brady Statistic RV Percent Paced: 0.03 %
Date Time Interrogation Session: 20250915161536
HighPow Impedance: 47.9844
Implantable Lead Connection Status: 753985
Implantable Lead Connection Status: 753985
Implantable Lead Implant Date: 20120802
Implantable Lead Implant Date: 20120802
Implantable Lead Location: 753859
Implantable Lead Location: 753860
Implantable Lead Model: 185
Implantable Lead Serial Number: 348730
Implantable Pulse Generator Implant Date: 20190816
Lead Channel Impedance Value: 312.5 Ohm
Lead Channel Impedance Value: 362.5 Ohm
Lead Channel Pacing Threshold Amplitude: 1 V
Lead Channel Pacing Threshold Amplitude: 1 V
Lead Channel Pacing Threshold Amplitude: 1.5 V
Lead Channel Pacing Threshold Amplitude: 1.5 V
Lead Channel Pacing Threshold Pulse Width: 0.5 ms
Lead Channel Pacing Threshold Pulse Width: 0.5 ms
Lead Channel Pacing Threshold Pulse Width: 0.5 ms
Lead Channel Pacing Threshold Pulse Width: 0.5 ms
Lead Channel Sensing Intrinsic Amplitude: 12 mV
Lead Channel Sensing Intrinsic Amplitude: 3.9 mV
Lead Channel Setting Pacing Amplitude: 2 V
Lead Channel Setting Pacing Amplitude: 2.5 V
Lead Channel Setting Pacing Pulse Width: 0.5 ms
Lead Channel Setting Sensing Sensitivity: 0.5 mV
Pulse Gen Serial Number: 9763545

## 2023-09-18 NOTE — Patient Instructions (Signed)
 Medication Instructions:  Your physician recommends that you continue on your current medications as directed. Please refer to the Current Medication list given to you today.  *If you need a refill on your cardiac medications before your next appointment, please call your pharmacy*  Follow-Up: At [facility], you and your health needs are our priority.  As part of our continuing mission to provide you with exceptional heart care, our providers are all part of one team.  This team includes your primary Cardiologist (physician) and Advanced Practice Providers or APPs (Physician Assistants and Nurse Practitioners) who all work together to provide you with the care you need, when you need it.  Your next appointment:   1 year  Provider:   You will see one of the following Advanced Practice Providers on your designated Care Team:   Charlies Arthur, [place] Ozell Jodie Passey, PA-C Suzann Riddle, NP Daphne Barrack, NP Artist Pouch, PA-C

## 2023-09-18 NOTE — Progress Notes (Signed)
 Electrophysiology Office Note:   Date:  09/18/2023  ID:  Derrick Lane, DOB [birth year], MRN [id number]  Primary Cardiologist: None Electrophysiologist: Fonda Kitty, MD      History of Present Illness:   Derrick Lane is a 63 y.o. male with h/o CAD s/p anterior MI in May 2011 treated with DES, chronic systolic heart failure secondary to ischemic cardiomyopathy s/p ICD placement who is being seen today for ICD follow up.   Discussed the use of AI scribe software for clinical note transcription with the patient, who gave verbal consent to proceed.  History of Present Illness Derrick Lane is a 63 year old male with heart failure and an ICD who presents for an annual check-up of his device.  He was previously under the care of Dr. Fernande.  He has a history of heart failure and an implantable cardioverter-defibrillator (ICD) placed due to his heart condition. The ICD has been functioning well without any issues, and he follows up regularly for monitoring. He is currently on a medication regimen that includes candesartan , carvedilol , dapagliflozin, and eplerenone , which he finds stable and effective.  This resulted in improvement in his LVEF.  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 He discussed the possibility of disabling the ICD with a doctor from the [facility] but ultimately decided against it as it would require changing his medication regimen, which he was not willing to do as it had been so effective.  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 He is actively involved with his family, including his grandchildren, and enjoys playing golf.  No chest pain, worsening shortness of breath, orthopnea, PND, lower extremity edema.  No new or acute complaints today.  Review of systems complete and found to be  negative unless listed in HPI.   EP Information / Studies Reviewed:    EKG is ordered today. Personal review as below.      Echo 07/21/23:   1. Left ventricular ejection fraction, by estimation, is 40 to 45%. The  left ventricle has mildly decreased function. The left ventricle  demonstrates global hypokinesis. The left ventricular internal cavity size  was mildly dilated. Left ventricular  diastolic parameters are consistent with Grade I diastolic dysfunction  (impaired relaxation).   2. Right ventricular systolic function is normal. The right ventricular  size is normal.   3. The mitral valve is normal in structure. No evidence of mitral valve  regurgitation. No evidence of mitral stenosis.   4. The aortic valve is tricuspid. There is mild calcification of the  aortic valve. Aortic valve regurgitation is not visualized. No aortic  stenosis is present.   5. The inferior vena cava is normal in size with greater than 50%  respiratory variability, suggesting right atrial pressure of 3 mmHg.   Physical Exam:   VS:  There were no vitals taken for this visit.   Wt Readings from Last 3 Encounters:  07/21/23 208 lb (94.3 kg)  03/10/22 201 lb 3.2 oz (91.3 kg)  05/13/21 199 lb (90.3 kg)     GEN: Well nourished, well developed in no acute distress NECK: No JVD CARDIAC: Normal rate, regular rhythm.  Well-healed left chest ICD pocket. RESPIRATORY:  Clear to auscultation without rales, wheezing or rhonchi  ABDOMEN: Soft, non-distended  EXTREMITIES:  No edema; No deformity   ASSESSMENT AND PLAN:    #S/p ICD - In-clinic device interrogation was performed today.  Appropriate device function and stable lead parameters.  Presenting rhythm AS-VS. - Continue remote monitoring.  #Chronic systolic heart failure: Ejection fraction has improved from 25% to 45% with GDMT.  Appears well compensated today. #Ischemic cardiomyopathy - Continue excellent GDMT regimen of candesartan , carvedilol ,  eplerenone , empagliflozin.  Lasix  for volume management.  Continue regular follow-up with HF clinic.  #CAD: Denies chest pain. -Continue aspirin, Plavix , rosuvastatin .   Follow up with Dr. Kennyth in 12 months  Signed, Fonda Kennyth, MD

## 2023-09-21 ENCOUNTER — Other Ambulatory Visit (HOSPITAL_COMMUNITY): Payer: Self-pay | Admitting: Internal Medicine

## 2023-09-24 ENCOUNTER — Ambulatory Visit: Payer: Self-pay | Admitting: Cardiology

## 2023-09-29 NOTE — Progress Notes (Signed)
Remote ICD Transmission.

## 2023-10-04 ENCOUNTER — Other Ambulatory Visit (HOSPITAL_COMMUNITY): Payer: Self-pay | Admitting: Cardiology

## 2023-11-13 ENCOUNTER — Ambulatory Visit (INDEPENDENT_AMBULATORY_CARE_PROVIDER_SITE_OTHER): Payer: BC Managed Care – PPO

## 2023-11-13 DIAGNOSIS — I5022 Chronic systolic (congestive) heart failure: Secondary | ICD-10-CM

## 2023-11-13 LAB — CUP PACEART REMOTE DEVICE CHECK
Battery Remaining Longevity: 43 mo
Battery Remaining Percentage: 40 %
Battery Voltage: 2.93 V
Brady Statistic AP VP Percent: 0 %
Brady Statistic AP VS Percent: 1 %
Brady Statistic AS VP Percent: 1 %
Brady Statistic AS VS Percent: 99 %
Brady Statistic RA Percent Paced: 1 %
Brady Statistic RV Percent Paced: 1 %
Date Time Interrogation Session: 20251110055924
HighPow Impedance: 48 Ohm
HighPow Impedance: 48 Ohm
Implantable Lead Connection Status: 753985
Implantable Lead Connection Status: 753985
Implantable Lead Implant Date: 20120802
Implantable Lead Implant Date: 20120802
Implantable Lead Location: 753859
Implantable Lead Location: 753860
Implantable Lead Model: 185
Implantable Lead Serial Number: 348730
Implantable Pulse Generator Implant Date: 20190816
Lead Channel Impedance Value: 380 Ohm
Lead Channel Impedance Value: 390 Ohm
Lead Channel Pacing Threshold Amplitude: 1 V
Lead Channel Pacing Threshold Amplitude: 1.5 V
Lead Channel Pacing Threshold Pulse Width: 0.5 ms
Lead Channel Pacing Threshold Pulse Width: 0.5 ms
Lead Channel Sensing Intrinsic Amplitude: 12 mV
Lead Channel Sensing Intrinsic Amplitude: 3.7 mV
Lead Channel Setting Pacing Amplitude: 2 V
Lead Channel Setting Pacing Amplitude: 2.5 V
Lead Channel Setting Pacing Pulse Width: 0.5 ms
Lead Channel Setting Sensing Sensitivity: 0.5 mV
Pulse Gen Serial Number: 9763545

## 2023-11-16 NOTE — Progress Notes (Signed)
 Remote ICD Transmission

## 2023-11-17 ENCOUNTER — Ambulatory Visit: Payer: Self-pay | Admitting: Cardiology

## 2023-12-14 ENCOUNTER — Other Ambulatory Visit (HOSPITAL_COMMUNITY): Payer: Self-pay | Admitting: Internal Medicine

## 2024-01-06 ENCOUNTER — Other Ambulatory Visit (HOSPITAL_COMMUNITY): Payer: Self-pay | Admitting: Internal Medicine

## 2024-01-30 ENCOUNTER — Other Ambulatory Visit (HOSPITAL_COMMUNITY): Payer: Self-pay | Admitting: Internal Medicine
